# Patient Record
Sex: Male | Born: 1955
Health system: Southern US, Community
[De-identification: ages and names within clinical notes are randomized; demographics above are authoritative.]

## PROBLEM LIST (undated history)

## (undated) DIAGNOSIS — E119 Type 2 diabetes mellitus without complications: Secondary | ICD-10-CM

## (undated) DIAGNOSIS — I219 Acute myocardial infarction, unspecified: Secondary | ICD-10-CM

## (undated) DIAGNOSIS — F32A Depression, unspecified: Secondary | ICD-10-CM

## (undated) DIAGNOSIS — E785 Hyperlipidemia, unspecified: Secondary | ICD-10-CM

## (undated) DIAGNOSIS — F419 Anxiety disorder, unspecified: Secondary | ICD-10-CM

## (undated) DIAGNOSIS — I6529 Occlusion and stenosis of unspecified carotid artery: Secondary | ICD-10-CM

## (undated) DIAGNOSIS — K219 Gastro-esophageal reflux disease without esophagitis: Secondary | ICD-10-CM

## (undated) DIAGNOSIS — F329 Major depressive disorder, single episode, unspecified: Secondary | ICD-10-CM

## (undated) HISTORY — DX: Occlusion and stenosis of unspecified carotid artery: I65.29

## (undated) HISTORY — DX: Gastro-esophageal reflux disease without esophagitis: K21.9

## (undated) HISTORY — DX: Anxiety disorder, unspecified: F41.9

## (undated) HISTORY — DX: Major depressive disorder, single episode, unspecified: F32.9

## (undated) HISTORY — DX: Type 2 diabetes mellitus without complications: E11.9

## (undated) HISTORY — PX: ROTATOR CUFF REPAIR: SHX139

## (undated) HISTORY — DX: Acute myocardial infarction, unspecified: I21.9

## (undated) HISTORY — DX: Hyperlipidemia, unspecified: E78.5

## (undated) HISTORY — DX: Depression, unspecified: F32.A

---

## 2000-07-22 ENCOUNTER — Encounter: Payer: Self-pay | Admitting: Emergency Medicine

## 2000-07-22 ENCOUNTER — Emergency Department (HOSPITAL_COMMUNITY): Admission: EM | Admit: 2000-07-22 | Discharge: 2000-07-22 | Payer: Self-pay | Admitting: Emergency Medicine

## 2001-10-12 ENCOUNTER — Encounter: Payer: Self-pay | Admitting: Family Medicine

## 2001-10-12 ENCOUNTER — Encounter: Admission: RE | Admit: 2001-10-12 | Discharge: 2001-10-12 | Payer: Self-pay | Admitting: Family Medicine

## 2007-09-07 ENCOUNTER — Encounter: Payer: Self-pay | Admitting: Family Medicine

## 2007-09-21 ENCOUNTER — Encounter: Admission: RE | Admit: 2007-09-21 | Discharge: 2007-09-21 | Payer: Self-pay | Admitting: Sports Medicine

## 2007-10-20 ENCOUNTER — Ambulatory Visit (HOSPITAL_COMMUNITY)
Admission: RE | Admit: 2007-10-20 | Discharge: 2007-10-20 | Payer: Self-pay | Admitting: Physical Medicine and Rehabilitation

## 2007-10-27 ENCOUNTER — Ambulatory Visit: Payer: Self-pay | Admitting: Family Medicine

## 2007-10-27 DIAGNOSIS — E781 Pure hyperglyceridemia: Secondary | ICD-10-CM | POA: Insufficient documentation

## 2007-11-29 ENCOUNTER — Telehealth (INDEPENDENT_AMBULATORY_CARE_PROVIDER_SITE_OTHER): Payer: Self-pay | Admitting: *Deleted

## 2008-02-09 ENCOUNTER — Telehealth: Payer: Self-pay | Admitting: Family Medicine

## 2008-02-10 ENCOUNTER — Ambulatory Visit: Payer: Self-pay | Admitting: Family Medicine

## 2008-02-10 DIAGNOSIS — F4389 Other reactions to severe stress: Secondary | ICD-10-CM | POA: Insufficient documentation

## 2008-02-10 DIAGNOSIS — F438 Other reactions to severe stress: Secondary | ICD-10-CM

## 2008-03-06 ENCOUNTER — Ambulatory Visit: Payer: Self-pay | Admitting: Family Medicine

## 2008-07-18 IMAGING — CR DG ORBITS FOR FOREIGN BODY
2 series · 2 of 2 positions shown · non-contrast
Comparison: None

CLINICAL DATA: Metal exposure study.
 DIAGNOSTIC EYE FOREIGN BODY DETECTION ? 2 VIEW:

[view not recorded (1 of 2)]
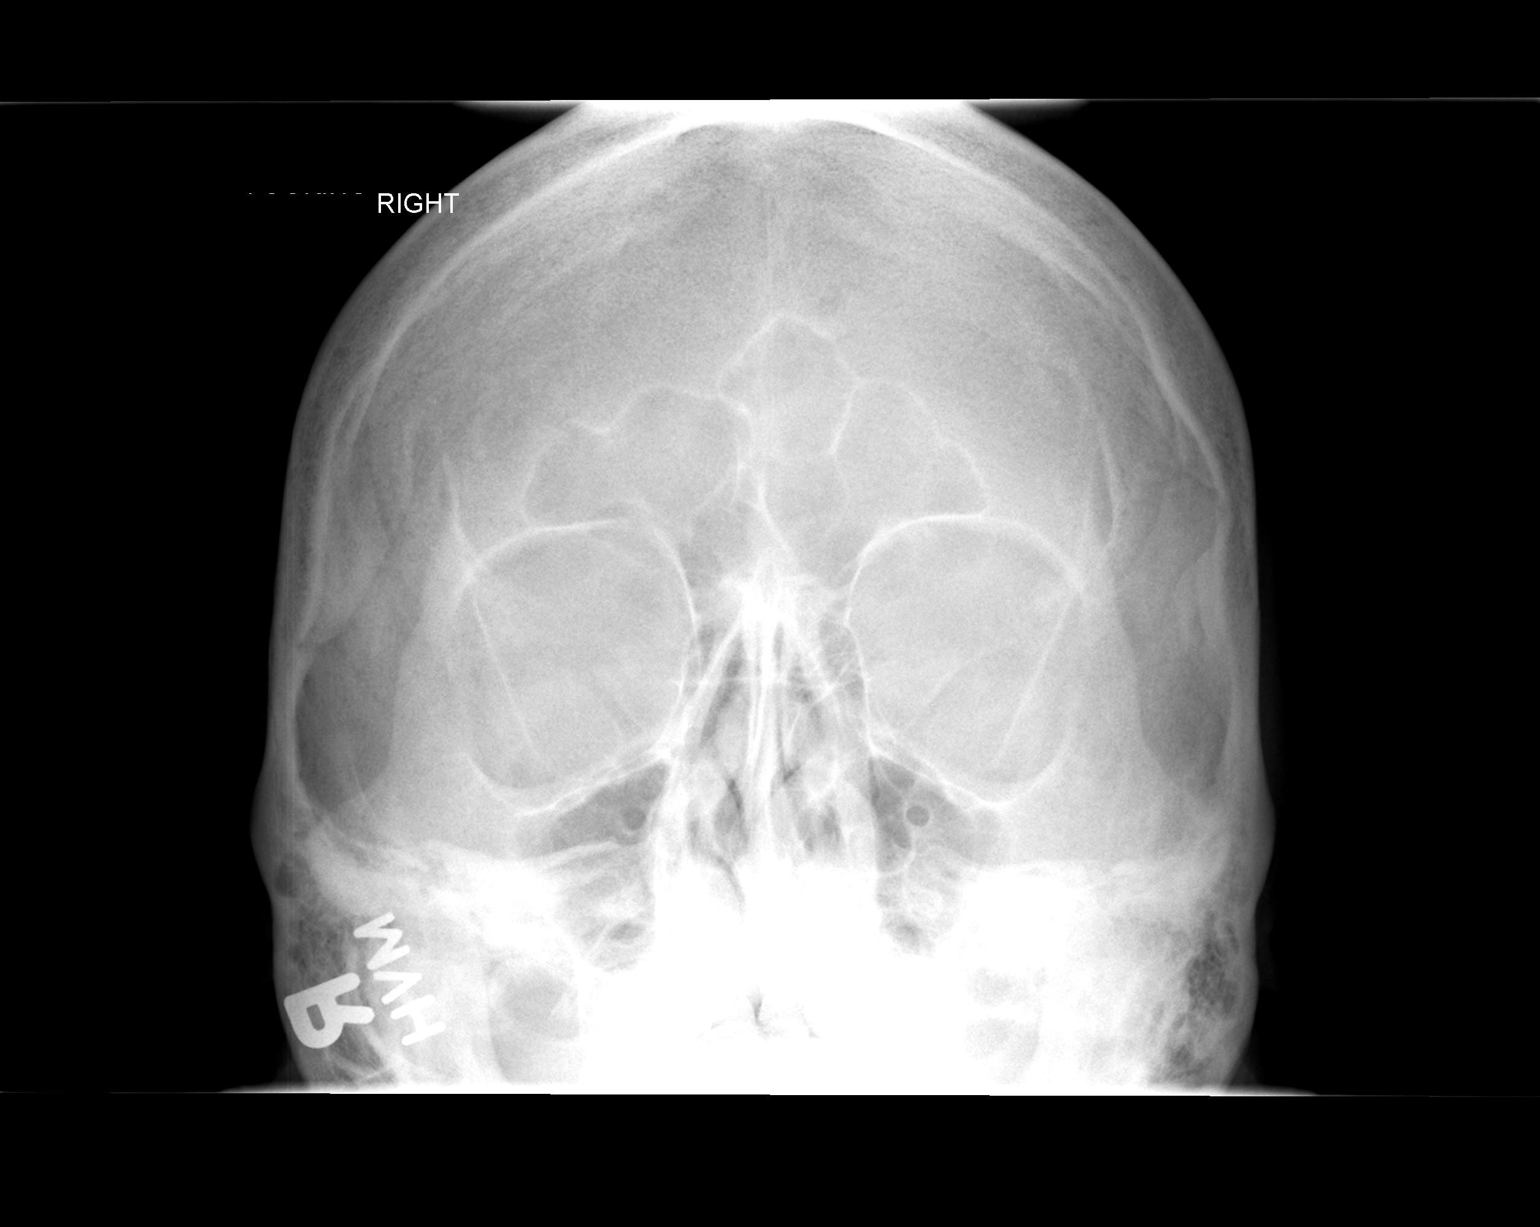

[view not recorded (2 of 2)]
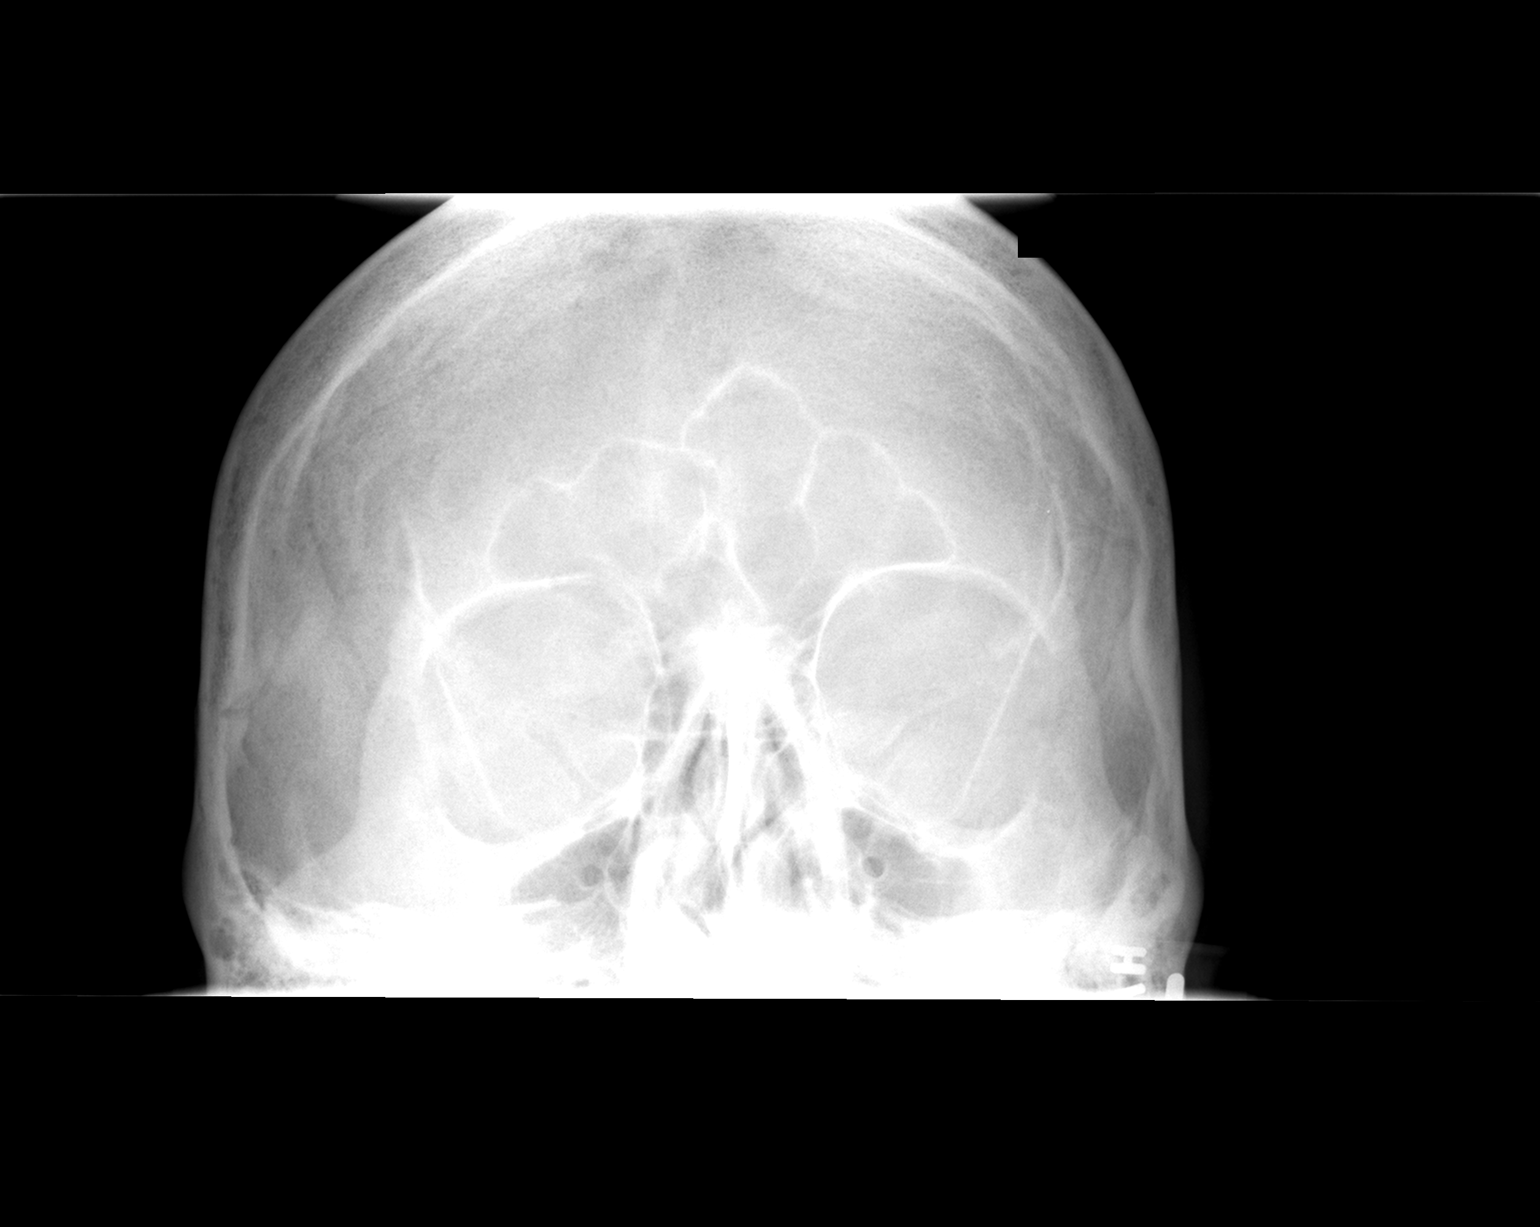

[2 of 2 positions shown; findings below may reference images not displayed]

FINDINGS: There is no evidence of metallic foreign body within the orbits.  No significant bone abnormality identified.
IMPRESSION: No evidence of metallic foreign body within the orbits.

## 2008-08-03 HISTORY — PX: SHOULDER SURGERY: SHX246

## 2009-07-11 ENCOUNTER — Ambulatory Visit: Payer: Self-pay | Admitting: Family Medicine

## 2009-07-11 LAB — CONVERTED CEMR LAB
ALT: 24 units/L (ref 0–53)
AST: 17 units/L (ref 0–37)
Albumin: 4.1 g/dL (ref 3.5–5.2)
Alkaline Phosphatase: 68 units/L (ref 39–117)
BUN: 18 mg/dL (ref 6–23)
Basophils Absolute: 0 10*3/uL (ref 0.0–0.1)
Basophils Relative: 0.5 % (ref 0.0–3.0)
Bilirubin Urine: NEGATIVE
Bilirubin, Direct: 0.1 mg/dL (ref 0.0–0.3)
Blood in Urine, dipstick: NEGATIVE
CO2: 28 meq/L (ref 19–32)
Calcium: 9.1 mg/dL (ref 8.4–10.5)
Chloride: 105 meq/L (ref 96–112)
Cholesterol: 243 mg/dL — ABNORMAL HIGH (ref 0–200)
Creatinine, Ser: 1.1 mg/dL (ref 0.4–1.5)
Direct LDL: 135.6 mg/dL
Eosinophils Absolute: 0.1 10*3/uL (ref 0.0–0.7)
Eosinophils Relative: 1.6 % (ref 0.0–5.0)
GFR calc non Af Amer: 74.24 mL/min (ref >60)
Glucose, Bld: 119 mg/dL — ABNORMAL HIGH (ref 70–99)
Glucose, Urine, Semiquant: NEGATIVE
HCT: 47.6 % (ref 39.0–52.0)
HDL: 44.3 mg/dL (ref >39.00)
Hemoglobin: 16.7 g/dL (ref 13.0–17.0)
Ketones, urine, test strip: NEGATIVE
Lymphocytes Relative: 25.1 % (ref 12.0–46.0)
Lymphs Abs: 1.4 10*3/uL (ref 0.7–4.0)
MCHC: 35.2 g/dL (ref 30.0–36.0)
MCV: 93.8 fL (ref 78.0–100.0)
Monocytes Absolute: 0.4 10*3/uL (ref 0.1–1.0)
Monocytes Relative: 6.6 % (ref 3.0–12.0)
Neutro Abs: 3.8 10*3/uL (ref 1.4–7.7)
Neutrophils Relative %: 66.2 % (ref 43.0–77.0)
Nitrite: NEGATIVE
PSA: 0.59 ng/mL (ref 0.10–4.00)
Platelets: 199 10*3/uL (ref 150.0–400.0)
Potassium: 4 meq/L (ref 3.5–5.1)
Protein, U semiquant: NEGATIVE
RBC: 5.07 M/uL (ref 4.22–5.81)
RDW: 11.7 % (ref 11.5–14.6)
Sodium: 142 meq/L (ref 135–145)
Specific Gravity, Urine: 1.02
TSH: 2.21 microintl units/mL (ref 0.35–5.50)
Total Bilirubin: 1.1 mg/dL (ref 0.3–1.2)
Total CHOL/HDL Ratio: 5
Total Protein: 6.6 g/dL (ref 6.0–8.3)
Triglycerides: 298 mg/dL — ABNORMAL HIGH (ref 0.0–149.0)
Urobilinogen, UA: 0.2
VLDL: 59.6 mg/dL — ABNORMAL HIGH (ref 0.0–40.0)
WBC Urine, dipstick: NEGATIVE
WBC: 5.7 10*3/uL (ref 4.5–10.5)
pH: 5

## 2009-07-22 ENCOUNTER — Ambulatory Visit: Payer: Self-pay | Admitting: Family Medicine

## 2009-11-26 ENCOUNTER — Telehealth: Payer: Self-pay | Admitting: Family Medicine

## 2009-12-10 ENCOUNTER — Encounter (INDEPENDENT_AMBULATORY_CARE_PROVIDER_SITE_OTHER): Payer: Self-pay | Admitting: *Deleted

## 2009-12-11 ENCOUNTER — Ambulatory Visit: Payer: Self-pay | Admitting: Gastroenterology

## 2009-12-23 ENCOUNTER — Ambulatory Visit: Payer: Self-pay | Admitting: Gastroenterology

## 2010-02-04 ENCOUNTER — Telehealth: Payer: Self-pay | Admitting: Family Medicine

## 2010-08-31 LAB — CONVERTED CEMR LAB
Basophils Absolute: 0 10*3/uL (ref 0.0–0.1)
Basophils Relative: 0.6 % (ref 0.0–1.0)
Eosinophils Absolute: 0.1 10*3/uL (ref 0.0–0.6)
Eosinophils Relative: 1 % (ref 0.0–5.0)
HCT: 45.1 % (ref 39.0–52.0)
Hemoglobin: 15.7 g/dL (ref 13.0–17.0)
Lymphocytes Relative: 21.4 % (ref 12.0–46.0)
MCHC: 34.8 g/dL (ref 30.0–36.0)
MCV: 92.5 fL (ref 78.0–100.0)
Monocytes Absolute: 0.4 10*3/uL (ref 0.2–0.7)
Monocytes Relative: 5.7 % (ref 3.0–11.0)
Neutro Abs: 4.4 10*3/uL (ref 1.4–7.7)
Neutrophils Relative %: 71.3 % (ref 43.0–77.0)
Platelets: 230 10*3/uL (ref 150–400)
RBC: 4.88 M/uL (ref 4.22–5.81)
RDW: 11.9 % (ref 11.5–14.6)
TSH: 1.19 microintl units/mL (ref 0.35–5.50)
WBC: 6.2 10*3/uL (ref 4.5–10.5)

## 2010-09-02 NOTE — Procedures (Signed)
Summary: Colonoscopy  Patient: [name] Note: All result statuses are Final unless otherwise noted.  Tests: (1) Colonoscopy (COL)   COL Colonoscopy           DONE     [facility]     [address]     [place], [place]  [address]           COLONOSCOPY PROCEDURE REPORT           PATIENT:  Matthew Patterson, Matthew Patterson  MR#:  [id number]     BIRTHDATE:  1956-04-17, 54 yrs. old  GENDER:  male     ENDOSCOPIST:  Vania Rea. Jarold Motto, MD, [facility]     REF. BY:  Tinnie Gens A. Tawanna Cooler, M.D.     PROCEDURE DATE:  12/23/2009     PROCEDURE:  Surveillance Colonoscopy     ASA CLASS:  Class II     INDICATIONS:  Routine Risk Screening     MEDICATIONS:   Fentanyl 75 mcg IV, Versed 6 mg IV           DESCRIPTION OF PROCEDURE:   After the risks benefits and     alternatives of the procedure were thoroughly explained, informed     consent was obtained.  Digital rectal exam was performed and     revealed no abnormalities.   The LB CF-[id number] endoscope     was introduced through the anus and advanced to the cecum, which     was identified by both the appendix and ileocecal valve, without     limitations.  The quality of the prep was excellent, using     MoviPrep.  The instrument was then slowly withdrawn as the colon     was fully examined.     <<PROCEDUREIMAGES>>           FINDINGS:  Moderate diverticulosis was found in the sigmoid to     descending colon segments.  No polyps or cancers were seen.  This     was otherwise a normal examination of the colon.   Retroflexed     views in the rectum revealed no abnormalities.    The scope was     then withdrawn from the patient and the procedure completed.           COMPLICATIONS:  None     ENDOSCOPIC IMPRESSION:     1) Moderate diverticulosis in the sigmoid to descending colon     segments     2) No polyps or cancers     3) Otherwise normal examination     RECOMMENDATIONS:     1) high fiber diet     2) Continue current colorectal screening  recommendations for     "routine risk" patients with a repeat colonoscopy in 10 years.     REPEAT EXAM:  No           ______________________________     Vania Rea. Jarold Motto, MD, [facility]           CC:           n.     eSIGNED:   Vania Rea. Vang Kraeger at 12/23/2009 11:19 AM           Tarri Abernethy, [id number]  Note: An exclamation mark (!) indicates a result that was not dispersed into the flowsheet. Document Creation Date: 12/23/2009 11:20 AM _______________________________________________________________________  (1) Order result status: Final Collection or observation date-time: 12/23/2009 11:12 Requested date-time:  Receipt date-time:  Reported date-time:  Referring  Physician:   Ordering Physician: Sheryn Bison [phone]) Specimen Source:  Source: Launa Grill Order Number: [id number] Lab site:   Appended Document: Colonoscopy    Clinical Lists Changes  Observations: Added new observation of COLONNXTDUE: 12/2019 (12/23/2009 13:23)

## 2010-09-02 NOTE — Miscellaneous (Signed)
Summary: [facility] previsit  Clinical Lists Changes  Medications: Added new medication of MOVIPREP 100 GM  SOLR (PEG-KCL-NACL-NASULF-NA ASC-C) As per prep instructions. - Signed Rx of MOVIPREP 100 GM  SOLR (PEG-KCL-NACL-NASULF-NA ASC-C) As per prep instructions.;  #1 x 0;  Signed;  Entered by: Karl Bales RN;  Authorized by: Mardella Layman MD [facility];  Method used: [facility] [phone]*, [address] [place], [place], [place]  [address], Ph: [phone], Fax: [phone]    Prescriptions: MOVIPREP 100 GM  SOLR (PEG-KCL-NACL-NASULF-NA ASC-C) As per prep instructions.  #1 x 0   Entered by:   Karl Bales RN   Authorized by:   Mardella Layman MD [facility]   Signed by:   Karl Bales RN on 12/11/2009   Method used:   Electronically to        [facility] [phone]* (retail)       [address]       [place], [place]  [address]       Ph: [phone]       Fax: [phone]   RxID:   [id number]

## 2010-09-02 NOTE — Letter (Signed)
Summary: [facility] Instructions  [facility]  [address] [place], [place] [address]   Phone: [phone]  Fax: [phone]       Matthew Patterson    Apr 01, 1956    MRN: [id number]        Procedure Day Dorna Bloom: [facility]  12/23/09     Arrival Time:  10:00am     Procedure Time:  11:00am     Location of Procedure:                    _ X_  [facility] (4th Floor)                        PREPARATION FOR COLONOSCOPY WITH MOVIPREP   Starting 5 days prior to your procedure  [facility] 05/18 do not eat nuts, seeds, popcorn, corn, beans, peas,  salads, or any raw vegetables.  Do not take any fiber supplements (e.g. Metamucil, Citrucel, and Benefiber).  THE DAY BEFORE YOUR PROCEDURE         DATE: SUNDAY 05/22   1.  Drink clear liquids the entire day-NO SOLID FOOD  2.  Do not drink anything colored red or purple.  Avoid juices with pulp.  No orange juice.  3.  Drink at least 64 oz. (8 glasses) of fluid/clear liquids during the day to prevent dehydration and help the prep work efficiently.  CLEAR LIQUIDS INCLUDE: Water Jello Ice Popsicles Tea (sugar ok, no milk/cream) Powdered fruit flavored drinks Coffee (sugar ok, no milk/cream) Gatorade Juice: apple, white grape, white cranberry  Lemonade Clear bullion, consomm, broth Carbonated beverages (any kind) Strained chicken noodle soup Hard Candy                             4.  In the morning, mix first dose of MoviPrep solution:    Empty 1 Pouch A and 1 Pouch B into the disposable container    Add lukewarm drinking water to the top line of the container. Mix to dissolve    Refrigerate (mixed solution should be used within 24 hrs)  5.  Begin drinking the prep at 5:00 p.m. The MoviPrep container is divided by 4 marks.   Every 15 minutes drink the solution down to the next mark (approximately 8 oz) until the full liter is complete.   6.  Follow completed prep with 16 oz of clear liquid of your choice  (Nothing red or purple).  Continue to drink clear liquids until bedtime.  7.  Before going to bed, mix second dose of MoviPrep solution:    Empty 1 Pouch A and 1 Pouch B into the disposable container    Add lukewarm drinking water to the top line of the container. Mix to dissolve    Refrigerate  THE DAY OF YOUR PROCEDURE      DATE: MONDAY 05/23  Beginning at  6:00 a.m. (5 hours before procedure):         1. Every 15 minutes, drink the solution down to the next mark (approx 8 oz) until the full liter is complete.  2. Follow completed prep with 16 oz. of clear liquid of your choice.    3. You may drink clear liquids until 9:00am  (2 HOURS BEFORE PROCEDURE).   MEDICATION INSTRUCTIONS  Unless otherwise instructed, you should take regular prescription medications with a small sip of water   as early as possible the  morning of your procedure.         OTHER INSTRUCTIONS  You will need a responsible adult at least 55 years of age to accompany you and drive you home.   This person must remain in the waiting room during your procedure.  Wear loose fitting clothing that is easily removed.  Leave jewelry and other valuables at home.  However, you may wish to bring a book to read or  an iPod/MP3 player to listen to music as you wait for your procedure to start.  Remove all body piercing jewelry and leave at home.  Total time from sign-in until discharge is approximately 2-3 hours.  You should go home directly after your procedure and rest.  You can resume normal activities the  day after your procedure.  The day of your procedure you should not:   Drive   Make legal decisions   Operate machinery   Drink alcohol   Return to work  You will receive specific instructions about eating, activities and medications before you leave.    The above instructions have been reviewed and explained to me by   Karl Bales RN  Dec 11, 2009 8:33 AM    I fully understand and  can verbalize these instructions _____________________________ Date _________

## 2010-09-02 NOTE — Progress Notes (Signed)
Summary: refill sent  Phone Note Refill Request Message from:  Fax from Pharmacy on February 04, 2010 3:16 PM  Refills Requested: Medication #1:  KLONOPIN 0.5 MG  TABS 1 tab @ bedtime. Initial call taken by: Kern Reap CMA Duncan Dull),  February 04, 2010 3:16 PM    Prescriptions: KLONOPIN 0.5 MG  TABS (CLONAZEPAM) 1 tab @ bedtime  #100 x 3   Entered by:   Kern Reap CMA (AAMA)   Authorized by:   Roderick Pee MD   Signed by:   Kern Reap CMA (AAMA) on 02/04/2010   Method used:   Telephoned to ...       [facility] [phone]* (retail)       [address]       [place], [place]  [address]       Ph: [phone]       Fax: [phone]   RxID:   [id number]

## 2010-09-02 NOTE — Progress Notes (Signed)
Summary: Referral for Colonoscopy  Phone Note Call from Patient Call back at [phone]   Caller: Spouse Summary of Call: Pt is due for colonoscopy please call pt and schedule Initial call taken by: Trixie Dredge,  November 26, 2009 10:58 AM  Follow-up for Phone Call        colonoscopy in gi Follow-up by: Roderick Pee MD,  November 26, 2009 1:24 PM  New Problems: SCREENING, COLON CANCER (ICD-V76.51)   New Problems: SCREENING, COLON CANCER (ICD-V76.51)

## 2010-12-24 ENCOUNTER — Other Ambulatory Visit: Payer: Self-pay | Admitting: Family Medicine

## 2011-04-30 ENCOUNTER — Other Ambulatory Visit: Payer: Self-pay | Admitting: *Deleted

## 2011-04-30 MED ORDER — CLONAZEPAM 0.5 MG PO TABS: 0.5000 mg | ORAL_TABLET | Freq: Two times a day (BID) | ORAL | Status: DC

## 2011-08-04 ENCOUNTER — Other Ambulatory Visit: Payer: Self-pay | Admitting: Family Medicine

## 2011-08-10 ENCOUNTER — Other Ambulatory Visit: Payer: Self-pay | Admitting: Family Medicine

## 2011-08-10 NOTE — Telephone Encounter (Signed)
Refill Cl[facility]am [facility]ram [facility].

## 2011-08-20 ENCOUNTER — Ambulatory Visit (INDEPENDENT_AMBULATORY_CARE_PROVIDER_SITE_OTHER)
Admission: RE | Admit: 2011-08-20 | Discharge: 2011-08-20 | Disposition: A | Discharge status: Home / Self Care | Payer: BC Managed Care – PPO | Source: Ambulatory Visit | Attending: Family Medicine | Admitting: Family Medicine

## 2011-08-20 ENCOUNTER — Ambulatory Visit (INDEPENDENT_AMBULATORY_CARE_PROVIDER_SITE_OTHER): Payer: BC Managed Care – PPO | Admitting: Family Medicine

## 2011-08-20 ENCOUNTER — Encounter: Payer: Self-pay | Admitting: Family Medicine

## 2011-08-20 VITALS — BP 140/90 | Temp 98.5°F | Ht 68.0 in | Wt 207.0 lb

## 2011-08-20 DIAGNOSIS — M25572 Pain in left ankle and joints of left foot: Secondary | ICD-10-CM

## 2011-08-20 DIAGNOSIS — M25579 Pain in unspecified ankle and joints of unspecified foot: Secondary | ICD-10-CM

## 2011-08-20 NOTE — Progress Notes (Signed)
  Subjective:    Patient ID: Matthew Patterson, male    DOB: [birth year], 56 y.o.   MRN: [id number]  HPI Matthew Patterson is a 56 year old male, who comes in today for evaluation of left ankle pain.  He states about 10 years ago.  He had a severe ankle injury and torn ligaments in his left ankle and required splinting for a long time until the pain and swelling resolved.  For about 6 weeks.  He said pain in his left ankle.  Again no history of trauma.  He points to below the lateral malleolus as a source of his pain.   Review of Systems    General an orthopedic review of systems otherwise negative Objective:   Physical Exam  Well-developed well-nourished, male in no acute distress.  Examination of left foot shows the foot appears to be normal.  No swelling circulation normal.  Skin normal.  No ligament laxity.  There is bony tenderness below the lateral malleolus      Assessment & Plan:  Pain left ankle, unknown etiology.  Plan elevation, ice, Motrin x-ray

## 2011-08-20 NOTE — Patient Instructions (Signed)
Take 600 mg of Motrin twice daily with food.  Do the stretching exercises in the morning before getting out of bed.  Elevation and ice p.r.n.Marland Kitchen  Go to the main office now for your x-ray......... I will call you the report

## 2011-08-27 ENCOUNTER — Telehealth: Payer: Self-pay | Admitting: Family Medicine

## 2011-10-05 ENCOUNTER — Other Ambulatory Visit (INDEPENDENT_AMBULATORY_CARE_PROVIDER_SITE_OTHER): Payer: BC Managed Care – PPO

## 2011-10-05 DIAGNOSIS — Z0000 Encounter for general adult medical examination without abnormal findings: Secondary | ICD-10-CM

## 2011-10-05 LAB — BASIC METABOLIC PANEL
BUN: 21 mg/dL (ref 6–23)
CO2: 26 mEq/L (ref 19–32)
Calcium: 8.9 mg/dL (ref 8.4–10.5)
Chloride: 103 mEq/L (ref 96–112)
Creatinine, Ser: 1.2 mg/dL (ref 0.4–1.5)

## 2011-10-05 LAB — POCT URINALYSIS DIPSTICK
Blood, UA: NEGATIVE
Glucose, UA: NEGATIVE
Spec Grav, UA: 1.025
Urobilinogen, UA: 1
pH, UA: 5.5

## 2011-10-05 LAB — CBC WITH DIFFERENTIAL/PLATELET
Basophils Absolute: 0 10*3/uL (ref 0.0–0.1)
Eosinophils Absolute: 0.1 10*3/uL (ref 0.0–0.7)
MCHC: 34.6 g/dL (ref 30.0–36.0)
MCV: 94 fl (ref 78.0–100.0)
Monocytes Absolute: 0.3 10*3/uL (ref 0.1–1.0)
Neutrophils Relative %: 68.4 % (ref 43.0–77.0)
Platelets: 190 10*3/uL (ref 150.0–400.0)
RDW: 12.6 % (ref 11.5–14.6)

## 2011-10-05 LAB — LIPID PANEL
Cholesterol: 256 mg/dL — ABNORMAL HIGH (ref 0–200)
HDL: 41.1 mg/dL (ref >39.00)
Total CHOL/HDL Ratio: 6
Triglycerides: 591 mg/dL — ABNORMAL HIGH (ref 0.0–149.0)
VLDL: 118.2 mg/dL — ABNORMAL HIGH (ref 0.0–40.0)

## 2011-10-05 LAB — HEPATIC FUNCTION PANEL
Bilirubin, Direct: 0.2 mg/dL (ref 0.0–0.3)
Total Bilirubin: 1.1 mg/dL (ref 0.3–1.2)
Total Protein: 6.9 g/dL (ref 6.0–8.3)

## 2011-10-12 ENCOUNTER — Encounter: Payer: BC Managed Care – PPO | Admitting: Family Medicine

## 2011-11-30 ENCOUNTER — Other Ambulatory Visit: Payer: Self-pay | Admitting: Family Medicine

## 2012-01-26 ENCOUNTER — Telehealth: Payer: Self-pay | Admitting: Family Medicine

## 2012-01-26 NOTE — Telephone Encounter (Signed)
Pt will not have insurance after July 17th and would like to be worked in for a Cpe. Please advise

## 2012-01-27 ENCOUNTER — Other Ambulatory Visit: Payer: Self-pay | Admitting: *Deleted

## 2012-01-27 MED ORDER — CITALOPRAM HYDROBROMIDE 20 MG PO TABS: ORAL_TABLET | ORAL | Status: DC

## 2012-01-27 NOTE — Telephone Encounter (Signed)
ok 

## 2012-02-11 ENCOUNTER — Encounter: Payer: Self-pay | Admitting: Family Medicine

## 2012-02-11 ENCOUNTER — Ambulatory Visit (INDEPENDENT_AMBULATORY_CARE_PROVIDER_SITE_OTHER): Payer: BC Managed Care – PPO | Admitting: Family Medicine

## 2012-02-11 VITALS — BP 130/90 | Temp 98.5°F | Ht 68.5 in | Wt 195.0 lb

## 2012-02-11 DIAGNOSIS — F4389 Other reactions to severe stress: Secondary | ICD-10-CM

## 2012-02-11 DIAGNOSIS — E782 Mixed hyperlipidemia: Secondary | ICD-10-CM

## 2012-02-11 DIAGNOSIS — F438 Other reactions to severe stress: Secondary | ICD-10-CM

## 2012-02-11 DIAGNOSIS — Z0000 Encounter for general adult medical examination without abnormal findings: Secondary | ICD-10-CM

## 2012-02-11 DIAGNOSIS — K2100 Gastro-esophageal reflux disease with esophagitis, without bleeding: Secondary | ICD-10-CM

## 2012-02-11 MED ORDER — CITALOPRAM HYDROBROMIDE 20 MG PO TABS: ORAL_TABLET | ORAL | Status: DC

## 2012-02-11 MED ORDER — OMEPRAZOLE 20 MG PO CPDR: DELAYED_RELEASE_CAPSULE | ORAL | Status: DC

## 2012-02-11 MED ORDER — CLONAZEPAM 0.5 MG PO TABS: 0.5000 mg | ORAL_TABLET | Freq: Two times a day (BID) | ORAL | Status: DC

## 2012-02-11 NOTE — Progress Notes (Signed)
  Subjective:    Patient ID: Matthew Patterson, male    DOB: 03-27-1956, 56 y.o.   MRN: [id number]  HPI Matthew Patterson is a 56 year old married male nonsmoker who comes in today for general medical examination  He has a history of mild anxiety and depression he takes Celexa 30 mg each bedtime and Klonopin 0.5 twice a day when necessary he averages about 40 tabs of the Klonopin per month. About 2 months ago he started having symptoms of reflux acid up in his esophagus. Sometimes it wakes him up at night. He does not smoke drink alcohol use caffeinated beverages or consume peppermint. He does tend to eat late at night.  Tetanus booster 2004  Originally from [place] Review of Systems  Constitutional: Negative.   HENT: Negative.   Eyes: Negative.   Respiratory: Negative.   Cardiovascular: Negative.   Gastrointestinal: Negative.   Genitourinary: Negative.   Musculoskeletal: Negative.   Skin: Negative.   Neurological: Negative.   Hematological: Negative.   Psychiatric/Behavioral: Negative.        Objective:   Physical Exam  Constitutional: He is oriented to person, place, and time. He appears well-developed and well-nourished.  HENT:  Head: Normocephalic and atraumatic.  Right Ear: External ear normal.  Left Ear: External ear normal.  Nose: Nose normal.  Mouth/Throat: Oropharynx is clear and moist.  Eyes: Conjunctivae and EOM are normal. Pupils are equal, round, and reactive to light.  Neck: Normal range of motion. Neck supple. No JVD present. No tracheal deviation present. No thyromegaly present.  Cardiovascular: Normal rate, regular rhythm, normal heart sounds and intact distal pulses.  Exam reveals no gallop and no friction rub.   No murmur heard. Pulmonary/Chest: Effort normal and breath sounds normal. No stridor. No respiratory distress. He has no wheezes. He has no rales. He exhibits no tenderness.  Abdominal: Soft. Bowel sounds are normal. He exhibits no distension and no  mass. There is no tenderness. There is no rebound and no guarding.  Genitourinary: Rectum normal, prostate normal and penis normal. Guaiac negative stool. No penile tenderness.  Musculoskeletal: Normal range of motion. He exhibits no edema and no tenderness.  Lymphadenopathy:    He has no cervical adenopathy.  Neurological: He is alert and oriented to person, place, and time. He has normal reflexes. No cranial nerve deficit. He exhibits normal muscle tone.  Skin: Skin is warm and dry. No rash noted. No erythema. No pallor.  Psychiatric: He has a normal mood and affect. His behavior is normal. Judgment and thought content normal.          Assessment & Plan:  Healthy male  Elevated triglycerides recommended diet exercise followup lipid panel in 3 months  History of mild depression and anxiety continue Celexa nightly Klonopin when necessary  Reflux esophagitis outlined in time reflux program

## 2012-02-11 NOTE — Patient Instructions (Signed)
For the reflux esophagitis nothing daily to for 3 hours prior to bedtime sleep on 2 pillows take the Prilosec twice daily for 3 weeks then one before your evening meal for 3 months then stop  Because of the elevated triglycerides I would recommend the diet we outlined, 20 minutes walk daily  Followup lipid panel and 3 months  General physical yearly  Continue the Celexa 1-1/2 tablets at bedtime  Klonopin 0.5,,,,,,,,, 1 twice daily as needed

## 2012-05-12 ENCOUNTER — Other Ambulatory Visit: Payer: BC Managed Care – PPO

## 2012-08-02 ENCOUNTER — Encounter: Payer: Self-pay | Admitting: Family Medicine

## 2012-08-02 ENCOUNTER — Ambulatory Visit (INDEPENDENT_AMBULATORY_CARE_PROVIDER_SITE_OTHER): Payer: Managed Care, Other (non HMO) | Admitting: Family Medicine

## 2012-08-02 VITALS — BP 120/80 | HR 94 | Temp 98.1°F | Wt 198.0 lb

## 2012-08-02 DIAGNOSIS — J398 Other specified diseases of upper respiratory tract: Secondary | ICD-10-CM

## 2012-08-02 DIAGNOSIS — J399 Disease of upper respiratory tract, unspecified: Secondary | ICD-10-CM

## 2012-08-02 MED ORDER — GUAIFENESIN-CODEINE 100-10 MG/5ML PO SYRP: 5.0000 mL | ORAL_SOLUTION | Freq: Three times a day (TID) | ORAL | Status: DC | PRN

## 2012-08-02 NOTE — Progress Notes (Signed)
Chief Complaint  Patient presents with  . Sore Throat    cough, soreness, fever, chest tightness x yesterday     HPI:  ? Flu: -started: yesterday -symptoms:nasal congestion, sore throat, cough -denies:fever, SOB, NVD, tooth pain, ear pain, strep, flu or mono exposure -has tried: OTC cold medicine -sick contacts: work Music therapist -Hx of - no hx of asthma  ROS: See pertinent positives and negatives per HPI.  No past medical history on file.  Family History  Problem Relation Age of Onset  . Diabetes Mother   . Hypertension Mother   . Cancer Sister     uterine cancer    History   Social History  . Marital Status: Married    Spouse Name: N/A    Number of Children: N/A  . Years of Education: N/A   Social History Main Topics  . Smoking status: Never Smoker   . Smokeless tobacco: None  . Alcohol Use: Yes  . Drug Use: No  . Sexually Active:    Other Topics Concern  . None   Social History Narrative  . None    Current outpatient prescriptions:clonazePAM (KLONOPIN) 0.5 MG tablet, Take 1 tablet (0.5 mg total) by mouth 2 (two) times daily., Disp: 100 tablet, Rfl: 2;  omeprazole (PRILOSEC) 20 MG capsule, 1 twice daily for 3 weeks then one before evening meal x3 months, Disp: 100 capsule, Rfl: 3;  citalopram (CELEXA) 20 MG tablet, 1 1/2 at bedtime, Disp: 150 tablet, Rfl: 3 guaiFENesin-codeine (ROBITUSSIN AC) 100-10 MG/5ML syrup, Take 5 mLs by mouth 3 (three) times daily as needed for cough., Disp: 120 mL, Rfl: 0  EXAM:  Filed Vitals:   08/02/12 0956  BP: 120/80  Pulse: 94  Temp: 98.1 F (36.7 C)    There is no height on file to calculate BMI.  GENERAL: vitals reviewed and listed above, alert, oriented, appears well hydrated and in no acute distress  HEENT: atraumatic, conjunttiva clear, no obvious abnormalities on inspection of external nose and ears, normal appearance of ear canals and TMs, clear nasal congestion, mild post oropharyngeal erythema with PND, no  tonsillar edema or exudate, no sinus TTP  NECK: no obvious masses on inspection  LUNGS: clear to auscultation bilaterally, no wheezes, rales or rhonchi, good air movement  CV: HRRR, no peripheral edema  MS: moves all extremities without noticeable abnormality  PSYCH: pleasant and cooperative, no obvious depression or anxiety  ASSESSMENT AND PLAN:  Discussed the following assessment and plan:  1. Upper respiratory disease  guaiFENesin-codeine (ROBITUSSIN AC) 100-10 MG/5ML syrup   -likely viral, supportive care and return precautions, discussed risks of cough medication -Patient advised to return or notify a doctor immediately if symptoms worsen or persist or new concerns arise.  Patient Instructions  INSTRUCTIONS FOR UPPER RESPIRATORY INFECTION:  -plenty of rest and fluids  -nasal saline wash 2-3 times daily (use prepackaged nasal saline or bottled/distilled water if making your own)   -can use sinex nasal spray for drainage and nasal congestion - but do NOT use longer then 3-4 days  -can use tylenol or ibuprofen as directed for aches and sorethroat  -in the winter time, using a humidifier at night is helpful (please follow cleaning instructions)  -if you are taking a cough medication - use only as directed, may also try a teaspoon of honey to coat the throat and throat lozenges  -for sore throat, salt water gargles can help  -follow up if you have fevers, facial pain, tooth pain, difficulty breathing  or are worsening or not getting better in 5-7 days      KIM, [facility] R.

## 2012-08-02 NOTE — Patient Instructions (Addendum)

## 2012-08-07 ENCOUNTER — Other Ambulatory Visit: Payer: Self-pay | Admitting: Family Medicine

## 2012-08-23 ENCOUNTER — Other Ambulatory Visit: Payer: Self-pay | Admitting: *Deleted

## 2012-08-23 DIAGNOSIS — F4389 Other reactions to severe stress: Secondary | ICD-10-CM

## 2012-08-23 DIAGNOSIS — F438 Other reactions to severe stress: Secondary | ICD-10-CM

## 2012-08-23 MED ORDER — CLONAZEPAM 0.5 MG PO TABS: 0.5000 mg | ORAL_TABLET | Freq: Two times a day (BID) | ORAL | Status: DC

## 2012-10-19 ENCOUNTER — Telehealth: Payer: Self-pay | Admitting: Family Medicine

## 2012-10-19 NOTE — Telephone Encounter (Signed)
Patient Information:  Caller Name: Sheilah Pigeon  Phone: [phone]  Patient: Matthew Patterson, Matthew Patterson  Gender: Male  DOB: August 23, 1955  Age: 57 Years  PCP: Kelle Darting [facility])  Office Follow Up:  Does the office need to follow up with this patient?: No  Instructions For The Office: N/A  RN Note:  Family history of early MI's. Under lots of stress. Exhausted.  Had severe, stabbing pain between shoulder blades with nausea/emesis and fatigue 10/12/12.  Was still not feeling "great" 10/17/12 so had a deep tissue massage 10/18/12. Wife calling for wellness visit to "get him checked out." Tien is working in [place] at time of call, does not know wife is calling for appointment and could not make it back to town before office closes. 30 minute appointment scheduled for 10/20/12 at 0900 with Dr Selena Batten.  Educated about 5 minute rule for chest pain and symptoms of MI.  If chest pain present > 5 minutes tonight, to call 911.    Symptoms  Reason For Call & Symptoms: Emergent: Asking for [name] to be checked for heart condition on maternal side of family with early MI's in 5th decade due to short valve.  Had episode of severe chest pain 10/12/12 that lasted > 1 hour with emesis.  Matthew Patterson is not present for triage; is not aware wife was calling for well appointment.  Has gained 20 lbs in last year. Is tired, sluggish and "depressed."  Reviewed Health History In EMR: Yes  Reviewed Medications In EMR: Yes  Reviewed Allergies In EMR: Yes  Reviewed Surgeries / Procedures: Yes  Date of Onset of Symptoms: 10/12/2012  Guideline(s) Used:  Chest Pain  Disposition Per Guideline:   See Today in Office  Reason For Disposition Reached:   Intermittent chest pains persist > 3 days  Advice Given:  Fleeting Chest Pain:  Fleeting chest pains that last only a few seconds and then go away are generally not serious. They may be from pinched muscles or nerves in your chest wall.  Call Back If:  Severe chest pain  Constant  chest pain lasting longer than 5 minutes  Difficulty breathing  Fever  You become worse.  Patient Will Follow Care Advice:  YES  Appointment Scheduled:  10/20/2012 09:00:00 Appointment Scheduled Provider:  Kriste Basque [facility])

## 2012-10-20 ENCOUNTER — Telehealth: Payer: Self-pay | Admitting: Family Medicine

## 2012-10-20 ENCOUNTER — Ambulatory Visit (INDEPENDENT_AMBULATORY_CARE_PROVIDER_SITE_OTHER): Payer: Managed Care, Other (non HMO) | Admitting: Family Medicine

## 2012-10-20 ENCOUNTER — Encounter: Payer: Self-pay | Admitting: Family Medicine

## 2012-10-20 VITALS — BP 140/80 | HR 70 | Temp 98.9°F | Wt 203.0 lb

## 2012-10-20 DIAGNOSIS — R0789 Other chest pain: Secondary | ICD-10-CM

## 2012-10-20 DIAGNOSIS — K219 Gastro-esophageal reflux disease without esophagitis: Secondary | ICD-10-CM

## 2012-10-20 DIAGNOSIS — F4389 Other reactions to severe stress: Secondary | ICD-10-CM

## 2012-10-20 DIAGNOSIS — R112 Nausea with vomiting, unspecified: Secondary | ICD-10-CM

## 2012-10-20 LAB — COMPREHENSIVE METABOLIC PANEL
Albumin: 3.8 g/dL (ref 3.5–5.2)
CO2: 27 mEq/L (ref 19–32)
Calcium: 8.4 mg/dL (ref 8.4–10.5)
GFR: 84.81 mL/min (ref >60.00)
Glucose, Bld: 134 mg/dL — ABNORMAL HIGH (ref 70–99)
Sodium: 139 mEq/L (ref 135–145)
Total Bilirubin: 0.9 mg/dL (ref 0.3–1.2)
Total Protein: 6.7 g/dL (ref 6.0–8.3)

## 2012-10-20 LAB — LIPID PANEL
Cholesterol: 214 mg/dL — ABNORMAL HIGH (ref 0–200)
VLDL: 107.2 mg/dL — ABNORMAL HIGH (ref 0.0–40.0)

## 2012-10-20 MED ORDER — CITALOPRAM HYDROBROMIDE 20 MG PO TABS: ORAL_TABLET | ORAL | Status: DC

## 2012-10-20 NOTE — Progress Notes (Signed)
Chief Complaint  Patient presents with  . episdoes of chest pain    HPI:  Atypical Chest Pain: -had some pain in upper shoulders while sitting at a computer last week -then had nausea and vomiting, had nausea the rest of the day and felt sick, threw up several times and had some burning substernal CP - nausea and malaise resolved after a day -back pain has persisted, severe at times constant - and had massage for this which helped -MGF died at 72 of MI, and mat aunt had MI at 81, maternal uncle had congenital heart defect - wife is very worried about heart issues and requests a referral to see the cardiolgist -Patient denies history of HLD, HTN, DM, smoke a cigar about once per month -denies: fevers, chills, weight changes, SOB, DOE, chest pain on exertion -does have a history of GERD but he only take this as needed, does have acid reflux issues, has chronic issues with throwing up in the morning sometimes several times a week to several times per month for many years -wife reports he sometimes chokes on food when he eat -per review of chart has hx of triglyceride issues  ROS: See pertinent positives and negatives per HPI.  No past medical history on file.  Family History  Problem Relation Age of Onset  . Diabetes Mother   . Hypertension Mother   . Cancer Sister     uterine cancer    History   Social History  . Marital Status: Married    Spouse Name: N/A    Number of Children: N/A  . Years of Education: N/A   Social History Main Topics  . Smoking status: Never Smoker   . Smokeless tobacco: None  . Alcohol Use: Yes  . Drug Use: No  . Sexually Active:    Other Topics Concern  . None   Social History Narrative  . None    Current outpatient prescriptions:citalopram (CELEXA) 20 MG tablet, 1 1/2 at bedtime, Disp: 150 tablet, Rfl: 3;  citalopram (CELEXA) 20 MG tablet, TAKE ONE AND ONE-HALF TABLETS BY MOUTH EVERY NIGHT AT BEDTIME, Disp: 45 tablet, Rfl: 3;  clonazePAM  (KLONOPIN) 0.5 MG tablet, Take 1 tablet (0.5 mg total) by mouth 2 (two) times daily., Disp: 200 tablet, Rfl: 1 omeprazole (PRILOSEC) 20 MG capsule, 1 twice daily for 3 weeks then one before evening meal x3 months, Disp: 100 capsule, Rfl: 3;  guaiFENesin-codeine (ROBITUSSIN AC) 100-10 MG/5ML syrup, Take 5 mLs by mouth 3 (three) times daily as needed for cough., Disp: 120 mL, Rfl: 0  EXAM:  Filed Vitals:   10/20/12 0859  BP: 140/80  Pulse: 70  Temp: 98.9 F (37.2 C)    Body mass index is 30.41 kg/(m^2).  GENERAL: vitals reviewed and listed above, alert, oriented, appears well hydrated and in no acute distress  HEENT: atraumatic, conjunttiva clear, no obvious abnormalities on inspection of external nose and ears  NECK: no obvious masses on inspection  LUNGS: clear to auscultation bilaterally, no wheezes, rales or rhonchi, good air movement  CV: HRRR, no peripheral edema  ABD: BS+, soft, NTTP  MS: moves all extremities without noticeable abnormality, TTP paraspinal upper thoracic and trap musculature  PSYCH: pleasant and cooperative, no obvious depression or anxiety  ASSESSMENT AND PLAN:  Discussed the following assessment and plan:  Atypical chest pain - Plan: Ambulatory referral to Cardiology, Ambulatory referral to Gastroenterology, CMP, Lipid Panel, CMP  GERD (gastroesophageal reflux disease) - Plan: Ambulatory referral to Gastroenterology  Nausea and vomiting - Plan: Ambulatory referral to Gastroenterology  -57 yo male with PMH dyslipidemia and FH heart dz presenting with back pain, atypical CP, nausea and vomiting several times per week to month for many years, uncontrolled GERD -EKG looks good and basic labs today pending -referred to cards per wife and pt adamant request -feel back pain is more musculoskeletal as reproducible on exam - advised heat, massage, etc -referred to GI as likely needs EGD and wife wants referral, advised daily PPI in the interim and GERD diet   -advised importance healthy diet, regular exercise and weight management -advised to cut back on alcohol consumption -follow up with PCP in 2 months, return and ED precautions -Patient advised to return or notify a doctor immediately if symptoms worsen or persist or new concerns arise.  There are no Patient Instructions on file for this visit.   Kriste Basque R.

## 2012-10-20 NOTE — Patient Instructions (Signed)
-  We placed a referral for you as discussed to the CARDIOLOGIST and to the GASTROENTEROLOGIST. It usually takes about 1-2 weeks to process and schedule this referral. If you have not heard from [place] regarding this appointment in 2 weeks please contact our office.  -We have ordered labs or studies at this visit. It can take up to 1-2 weeks for results and processing. We will contact you with instructions IF your results are abnormal. Normal results will be released to your [facility]. If you have not heard from [place] or can not find your results in [facility] in 2 weeks please contact our office.  -take your prilosec daily  -follow GERD diet  -schedule follow up with your doctor for physical

## 2012-10-20 NOTE — Telephone Encounter (Signed)
Labs stable. Triglycerides about the same as the past.

## 2012-10-20 NOTE — Addendum Note (Signed)
Addended by: Rossie Muskrat K on: 10/20/2012 10:00 AM   Modules accepted: Orders

## 2012-10-20 NOTE — Addendum Note (Signed)
Addended by: Azucena Freed on: 10/20/2012 12:03 PM   Modules accepted: Orders

## 2012-10-21 NOTE — Telephone Encounter (Signed)
Called and spoke with pt and pt is aware.  

## 2012-11-14 ENCOUNTER — Encounter: Payer: Self-pay | Admitting: *Deleted

## 2012-11-15 ENCOUNTER — Ambulatory Visit (INDEPENDENT_AMBULATORY_CARE_PROVIDER_SITE_OTHER): Payer: Managed Care, Other (non HMO) | Admitting: Cardiovascular Disease

## 2012-11-15 ENCOUNTER — Encounter: Payer: Self-pay | Admitting: Cardiovascular Disease

## 2012-11-15 VITALS — BP 152/100 | HR 56 | Ht 68.5 in | Wt 202.8 lb

## 2012-11-15 DIAGNOSIS — R9431 Abnormal electrocardiogram [ECG] [EKG]: Secondary | ICD-10-CM

## 2012-11-15 DIAGNOSIS — R0789 Other chest pain: Secondary | ICD-10-CM

## 2012-11-15 DIAGNOSIS — R079 Chest pain, unspecified: Secondary | ICD-10-CM

## 2012-11-15 NOTE — Patient Instructions (Addendum)
Your physician has requested that you have an echocardiogram. Echocardiography is a painless test that uses sound waves to create images of your heart. It provides your doctor with information about the size and shape of your heart and how well your heart's chambers and valves are working. This procedure takes approximately one hour. There are no restrictions for this procedure.  Your physician has requested that you have en exercise stress myoview.   Your physician recommends that you schedule a follow-up appointment in: 1 MONTH

## 2012-11-15 NOTE — Progress Notes (Signed)
     Matthew Patterson Date of Birth  03/28/1956       [facility] Office [address]  [address] [place], [place]  [address]   [place], [place]  [address] [phone]     [phone]   Fax  [phone]    Fax [phone]  Problem List: 1. Shoulder pain   History of Present Illness:  Matthew Patterson is a 57 yo with recent episode of shoulder and intrascapular pain.  This was associated with nausea and vomiting.  He was sitting at and working at his computer at the time. The most severe pain lasted several hours but he was left with a dull, achy pain that lasted for several days.  He has not had these pains previously. He exercises on occasion.  Since that time he has done his usual chores and does exercise some without any episodes of chest pain or shortness of breath. He has a strong family history of coronary disease on his mother's side of the family.  Current Outpatient Prescriptions on File Prior to Visit  Medication Sig Dispense Refill  . citalopram (CELEXA) 20 MG tablet 1 1/2 at bedtime  90 tablet  0  . clonazePAM (KLONOPIN) 0.5 MG tablet Take 1 tablet (0.5 mg total) by mouth 2 (two) times daily.  200 tablet  1  . guaiFENesin-codeine (ROBITUSSIN AC) 100-10 MG/5ML syrup Take 5 mLs by mouth 3 (three) times daily as needed for cough.  120 mL  0  . omeprazole (PRILOSEC) 20 MG capsule 1 twice daily for 3 weeks then one before evening meal x3 months  100 capsule  3   No current facility-administered medications on file prior to visit.    No Known Allergies  No past medical history on file.  Past Surgical History  Procedure Laterality Date  . Rotator cuff repair    . Shoulder surgery  2010    B/L    History  Smoking status  . Never Smoker   Smokeless tobacco  . Not on file    History  Alcohol Use  . Yes    Family History  Problem Relation Age of Onset  . Diabetes Mother   . Hypertension Mother   . Cancer Sister    uterine cancer    Reviw of Systems:  Reviewed in the HPI.  All other systems are negative.  Physical Exam: Blood pressure 152/100, pulse 56, height 5' 8.5" (1.74 m), weight 202 lb 12.8 oz (91.989 kg). General: Well developed, well nourished, in no acute distress.  Head: Normocephalic, atraumatic, sclera non-icteric, mucus membranes are moist,   Neck: Supple. Carotids are 2 + without bruits. No JVD   Lungs: Clear   Heart: RR, normal S1, S2  Abdomen: Soft, non-tender, non-distended with normal bowel sounds.  Msk:  Strength and tone are normal   Extremities: No clubbing or cyanosis. No edema.  Distal pedal pulses are 2+ and equal    Neuro: CN II - XII intact.  Alert and oriented X 3.   Psych:  Normal   ECG: November 15, 2012:  Sinus bradycardia at 56.  NS ST abnormality.  Assessment / Plan:

## 2012-11-15 NOTE — Assessment & Plan Note (Signed)
Jong presents with an episode of severe chest tightness that occurred spontaneously. He was sitting at his desk. The pain lasted for 20 or 30 minutes and then left him with a dull ache for the next day or so. It was associated with nausea and vomiting. He also had some shortness of breath. He did not have any dizziness or syncope.  I'm concerned that this might have been an episode of angina. He has a strong family history of premature coronary artery disease and also recently has had extremely elevated triglyceride levels above 500.  His EKG has mild ST changes.  We'll schedule him for a [employer] study.    He Also has a family history of congenital heart abnormalities. I think that we should get an echocardiogram given his symptoms and mildly abnormal EKG.

## 2012-11-16 ENCOUNTER — Other Ambulatory Visit: Payer: Self-pay | Admitting: *Deleted

## 2012-11-16 DIAGNOSIS — F4389 Other reactions to severe stress: Secondary | ICD-10-CM

## 2012-11-16 DIAGNOSIS — F438 Other reactions to severe stress: Secondary | ICD-10-CM

## 2012-11-16 MED ORDER — CLONAZEPAM 0.5 MG PO TABS: 0.5000 mg | ORAL_TABLET | Freq: Two times a day (BID) | ORAL | Status: DC

## 2012-11-17 ENCOUNTER — Ambulatory Visit (HOSPITAL_COMMUNITY): Payer: Managed Care, Other (non HMO) | Attending: Cardiovascular Disease | Admitting: Radiology

## 2012-11-17 DIAGNOSIS — R9431 Abnormal electrocardiogram [ECG] [EKG]: Secondary | ICD-10-CM | POA: Insufficient documentation

## 2012-11-17 DIAGNOSIS — R072 Precordial pain: Secondary | ICD-10-CM

## 2012-11-17 DIAGNOSIS — R079 Chest pain, unspecified: Secondary | ICD-10-CM | POA: Insufficient documentation

## 2012-11-17 NOTE — Progress Notes (Signed)
Echocardiogram performed.  

## 2012-11-28 ENCOUNTER — Ambulatory Visit (HOSPITAL_COMMUNITY): Payer: Managed Care, Other (non HMO) | Attending: Cardiology | Admitting: Radiology

## 2012-11-28 VITALS — BP 135/87 | HR 56 | Ht 68.5 in | Wt 196.0 lb

## 2012-11-28 DIAGNOSIS — R0789 Other chest pain: Secondary | ICD-10-CM | POA: Insufficient documentation

## 2012-11-28 DIAGNOSIS — R5381 Other malaise: Secondary | ICD-10-CM | POA: Insufficient documentation

## 2012-11-28 DIAGNOSIS — E785 Hyperlipidemia, unspecified: Secondary | ICD-10-CM | POA: Insufficient documentation

## 2012-11-28 DIAGNOSIS — R112 Nausea with vomiting, unspecified: Secondary | ICD-10-CM | POA: Insufficient documentation

## 2012-11-28 DIAGNOSIS — M25519 Pain in unspecified shoulder: Secondary | ICD-10-CM | POA: Insufficient documentation

## 2012-11-28 DIAGNOSIS — R079 Chest pain, unspecified: Secondary | ICD-10-CM

## 2012-11-28 DIAGNOSIS — I4949 Other premature depolarization: Secondary | ICD-10-CM

## 2012-11-28 DIAGNOSIS — F17200 Nicotine dependence, unspecified, uncomplicated: Secondary | ICD-10-CM | POA: Insufficient documentation

## 2012-11-28 DIAGNOSIS — R9431 Abnormal electrocardiogram [ECG] [EKG]: Secondary | ICD-10-CM

## 2012-11-28 DIAGNOSIS — R5383 Other fatigue: Secondary | ICD-10-CM | POA: Insufficient documentation

## 2012-11-28 MED ORDER — TECHNETIUM TC 99M SESTAMIBI GENERIC - CARDIOLITE
30.0000 | Freq: Once | INTRAVENOUS | Status: AC | PRN
  Administered 2012-11-28: 30 via INTRAVENOUS

## 2012-11-28 MED ORDER — TECHNETIUM TC 99M SESTAMIBI GENERIC - CARDIOLITE
10.0000 | Freq: Once | INTRAVENOUS | Status: AC | PRN
  Administered 2012-11-28: 10 via INTRAVENOUS

## 2012-11-28 NOTE — Progress Notes (Signed)
[facility] SITE 3 NUCLEAR MED [address] [place], [place] [address] [phone]    Cardiology Nuclear Med Study  Matthew Patterson is a 57 y.o. male     MRN : [id number]     DOB: 12-Mar-1956  Procedure Date: 11/28/2012  Nuclear Med Background Indication for Stress Test:  Evaluation for Ischemia History:  11/17/12 Echo:EF=60% Cardiac Risk Factors: Lipids and Smoker-Occ. Cigar  Symptoms:  Chest Tightness and (B) Shoulder Pain (last episode of chest discomfort was about a month ago), Fatigue, Nausea and Vomiting   Nuclear Pre-Procedure Caffeine/Decaff Intake:  None NPO After: 7:00pm   Lungs:  clear O2 Sat: 98% on room air. IV 0.9% NS with Angio Cath:  20g  IV Site: R Antecubital  IV Started by:  Stanton Kidney, EMT-P  Chest Size (in):  44 Cup Size: n/a  Height: 5' 8.5" (1.74 m)  Weight:  196 lb (88.905 kg)  BMI:  Body mass index is 29.36 kg/(m^2). Tech Comments:  NA    Nuclear Med Study 1 or 2 day study: 1 day  Stress Test Type:  Stress  Reading MD: Olga Millers, MD  Order Authorizing Provider:  Kristeen Miss, MD  Resting Radionuclide: Technetium 61m Sestamibi  Resting Radionuclide Dose: 11.0 mCi   Stress Radionuclide:  Technetium 44m Sestamibi  Stress Radionuclide Dose: 33.0 mCi           Stress Protocol Rest HR: 56 Stress HR: 151  Rest BP: 135/87 Stress BP: 224/102  Exercise Time (min): 8:18 METS: 10.1   Predicted Max HR: 163 bpm % Max HR: 92.64 bpm Rate Pressure Product: 21308   Dose of Adenosine (mg):  n/a Dose of Lexiscan: n/a mg  Dose of Atropine (mg): n/a Dose of Dobutamine: n/a mcg/kg/min (at max HR)  Stress Test Technologist: Smiley Houseman, CMA-N  Nuclear Technologist:  Domenic Polite, CNMT     Rest Procedure:  Myocardial perfusion imaging was performed at rest 45 minutes following the intravenous administration of Technetium 81m Sestamibi.  Rest ECG: Sinus bradycardia, no ST changes  Stress Procedure:  The patient exercised on the treadmill  utilizing the Bruce Protocol for 8:18 minutes. The patient stopped due to fatigue and denied any chest pain.  Technetium 49m Sestamibi was injected at peak exercise and myocardial perfusion imaging was performed after a brief delay.  Stress ECG: No significant ST segment change suggestive of ischemia.  QPS Raw Data Images:  Acquisition technically good; normal left ventricular size. Stress Images:  Normal homogeneous uptake in all areas of the myocardium. Rest Images:  Normal homogeneous uptake in all areas of the myocardium. Subtraction (SDS):  No evidence of ischemia. Transient Ischemic Dilatation (Normal <1.22):  0.98 Lung/Heart Ratio (Normal <0.45):  0.36  Quantitative Gated Spect Images QGS EDV:  64 ml QGS ESV:  25 ml  Impression Exercise Capacity:  Fair exercise capacity. BP Response:  Hypertensive blood pressure response. Clinical Symptoms:  There is dyspnea. ECG Impression:  No significant ST segment change suggestive of ischemia. Comparison with Prior Nuclear Study: No previous nuclear study performed  Overall Impression:  Normal stress nuclear study.  LV Ejection Fraction: 60%.  LV Wall Motion:  NL LV Function; NL Wall Motion  Olga Millers

## 2012-12-29 ENCOUNTER — Ambulatory Visit: Payer: Managed Care, Other (non HMO) | Admitting: Cardiovascular Disease

## 2013-02-09 ENCOUNTER — Other Ambulatory Visit: Payer: Self-pay | Admitting: Family Medicine

## 2013-02-09 NOTE — Telephone Encounter (Signed)
Will forward to PCP 

## 2013-03-07 ENCOUNTER — Other Ambulatory Visit: Payer: Self-pay | Admitting: Family Medicine

## 2013-04-21 ENCOUNTER — Ambulatory Visit (INDEPENDENT_AMBULATORY_CARE_PROVIDER_SITE_OTHER): Payer: Managed Care, Other (non HMO) | Admitting: Family Medicine

## 2013-04-21 ENCOUNTER — Encounter: Payer: Self-pay | Admitting: Family Medicine

## 2013-04-21 VITALS — BP 130/82 | Temp 98.5°F | Ht 69.0 in | Wt 198.0 lb

## 2013-04-21 DIAGNOSIS — K219 Gastro-esophageal reflux disease without esophagitis: Secondary | ICD-10-CM

## 2013-04-21 DIAGNOSIS — F32A Depression, unspecified: Secondary | ICD-10-CM

## 2013-04-21 DIAGNOSIS — Z0000 Encounter for general adult medical examination without abnormal findings: Secondary | ICD-10-CM

## 2013-04-21 DIAGNOSIS — F329 Major depressive disorder, single episode, unspecified: Secondary | ICD-10-CM

## 2013-04-21 DIAGNOSIS — E781 Pure hyperglyceridemia: Secondary | ICD-10-CM

## 2013-04-21 DIAGNOSIS — F341 Dysthymic disorder: Secondary | ICD-10-CM

## 2013-04-21 DIAGNOSIS — F4389 Other reactions to severe stress: Secondary | ICD-10-CM

## 2013-04-21 DIAGNOSIS — F438 Other reactions to severe stress: Secondary | ICD-10-CM

## 2013-04-21 DIAGNOSIS — Z23 Encounter for immunization: Secondary | ICD-10-CM

## 2013-04-21 LAB — LIPID PANEL
HDL: 41.4 mg/dL (ref >39.00)
VLDL: 107.8 mg/dL — ABNORMAL HIGH (ref 0.0–40.0)

## 2013-04-21 LAB — LDL CHOLESTEROL, DIRECT: Direct LDL: 115.5 mg/dL

## 2013-04-21 MED ORDER — CLONAZEPAM 0.5 MG PO TABS: 0.5000 mg | ORAL_TABLET | Freq: Two times a day (BID) | ORAL | Status: DC

## 2013-04-21 MED ORDER — CITALOPRAM HYDROBROMIDE 20 MG PO TABS: ORAL_TABLET | ORAL | Status: DC

## 2013-04-21 MED ORDER — OMEPRAZOLE 20 MG PO CPDR: DELAYED_RELEASE_CAPSULE | ORAL | Status: DC

## 2013-04-21 NOTE — Patient Instructions (Signed)
-  We have ordered labs or studies at this visit. It can take up to 1-2 weeks for results and processing. We will contact you with instructions IF your results are abnormal. Normal results will be released to your MYCHART. If you have not heard from us or can not find your results in MYCHART in 2 weeks please contact our office.  -PLEASE SIGN UP FOR MYCHART TODAY   We recommend the following healthy lifestyle measures: - eat a healthy diet consisting of lots of vegetables, fruits, beans, nuts, seeds, healthy meats such as white chicken and fish and whole grains.  - avoid fried foods, fast food, processed foods, sodas, red meet and other fattening foods.  - get a least 150 minutes of aerobic exercise per week.   Follow up in: 3-4 months  

## 2013-04-21 NOTE — Progress Notes (Signed)
Chief Complaint  Patient presents with  . Annual Exam    HPI:  57 yo M patient of Dr. Tawanna Cooler here for annual exam.   Concerns/Chronic issues: 1)Hypertrig: -not on medications for this -worked on diet and exercise after last check  2)GERD: -on omeprazole, stable -needs refills -occ symptoms and take his medication sporadically  3)Depression/ANxiety -on celexa, takes klonopin a few times per week ROS: See pertinent positives and negatives per HPI -stable   Health maintenance: -diet and exercise: walks several days per week, golfs, walks dog daily, diet is healthy -alcohol, drugs, substance abuse: none -STI/Hep C screening: does no -lipids and diabetes screening: done 10/2012 - elevated trig -vaccines: offered tdap and flu  -colon cancer screening: reports had colonoscopy 2-3 years ago and told normal and repeat 10 years ago -prostate cancer screening: discussed, patient does not wish to do this  ROS negative except where mentioned above.  No past medical history on file.  Past Surgical History  Procedure Laterality Date  . Rotator cuff repair    . Shoulder surgery  2010    B/L    Family History  Problem Relation Age of Onset  . Diabetes Mother   . Hypertension Mother   . Cancer Sister     uterine cancer    History   Social History  . Marital Status: Married    Spouse Name: N/A    Number of Children: N/A  . Years of Education: N/A   Social History Main Topics  . Smoking status: Never Smoker   . Smokeless tobacco: None  . Alcohol Use: Yes  . Drug Use: No  . Sexual Activity:    Other Topics Concern  . None   Social History Narrative  . None    Current outpatient prescriptions:citalopram (CELEXA) 20 MG tablet, TAKE ONE AND ONE-HALF TABLET BY MOUTH AT BEDTIME, Disp: 90 tablet, Rfl: 1;  clonazePAM (KLONOPIN) 0.5 MG tablet, Take 1 tablet (0.5 mg total) by mouth 2 (two) times daily., Disp: 60 tablet, Rfl: 0;  omeprazole (PRILOSEC) 20 MG capsule, Take one  capsule by mouth twice daily for 3 weeks then one before evening meal, Disp: 90 capsule, Rfl: 2 guaiFENesin-codeine (ROBITUSSIN AC) 100-10 MG/5ML syrup, Take 5 mLs by mouth 3 (three) times daily as needed for cough., Disp: 120 mL, Rfl: 0  EXAM:  Filed Vitals:   04/21/13 0758  BP: 130/82  Temp: 98.5 F (36.9 C)    Body mass index is 29.23 kg/(m^2).  GENERAL: vitals reviewed and listed above, alert, oriented, appears well hydrated and in no acute distress  HEENT: atraumatic, conjunttiva clear, no obvious abnormalities on inspection of external nose and ears  NECK: no obvious masses on inspection  LUNGS: clear to auscultation bilaterally, no wheezes, rales or rhonchi, good air movement  CV: HRRR, no peripheral edema  ABD: soft, NTTP  SKIN: no abnormal lesions noted on back or face or on brief exam  MS: moves all extremities without noticeable abnormality  PSYCH: pleasant and cooperative, no obvious depression or anxiety  GU: deferred  ASSESSMENT AND PLAN:  Discussed the following assessment and plan:  Visit for preventive health examination  Need for prophylactic vaccination and inoculation against influenza - Plan: Flu Vaccine QUAD 36+ mos PF IM (Fluarix)  Other acute reactions to stress - Plan: clonazePAM (KLONOPIN) 0.5 MG tablet, DISCONTINUED: clonazePAM (KLONOPIN) 0.5 MG tablet  Anxiety and depression - Plan: citalopram (CELEXA) 20 MG tablet, clonazePAM (KLONOPIN) 0.5 MG tablet, DISCONTINUED: clonazePAM (KLONOPIN) 0.5 MG  tablet  GERD (gastroesophageal reflux disease) - Plan: omeprazole (PRILOSEC) 20 MG capsule  Hypertriglyceridemia - Plan: Lipid Panel  -all level A and B USPSTF recs discussed/offered  -labs done in last year  -offered flu and tdap vaccines: done today  -discussed colon cancer and prostate screening options: he had colonoscopy in 2011, he deferred prostate cancer screening today after discussion risks and benefits  -offerd hep C and STI  screening: refused  -counseled on healthy diet and exercise  -refilled medications  -FASTING LIPIDs  -follow up with PCP in 3-4 months  -Patient advised to return or notify a doctor immediately if symptoms worsen or persist or new concerns arise.  Patient Instructions  -We have ordered labs or studies at this visit. It can take up to 1-2 weeks for results and processing. We will contact you with instructions IF your results are abnormal. Normal results will be released to your [facility]. If you have not heard from [place] or can not find your results in [facility] in 2 weeks please contact our office.  -PLEASE SIGN UP FOR MYCHART TODAY   We recommend the following healthy lifestyle measures: - eat a healthy diet consisting of lots of vegetables, fruits, beans, nuts, seeds, healthy meats such as white chicken and fish and whole grains.  - avoid fried foods, fast food, processed foods, sodas, red meet and other fattening foods.  - get a least 150 minutes of aerobic exercise per week.   Follow up in: 3-4 months      KIM, HANNAH R.

## 2013-04-21 NOTE — Progress Notes (Signed)
Quick Note:  Called and spoke with pt and pt is aware. ______ 

## 2013-04-21 NOTE — Progress Notes (Signed)
Quick Note:  Left a message for return call. ______ 

## 2013-05-20 ENCOUNTER — Other Ambulatory Visit: Payer: Self-pay | Admitting: Family Medicine

## 2013-08-14 NOTE — Telephone Encounter (Signed)
Opened in error

## 2013-09-27 ENCOUNTER — Telehealth: Payer: Self-pay | Admitting: Family Medicine

## 2013-09-27 DIAGNOSIS — F329 Major depressive disorder, single episode, unspecified: Secondary | ICD-10-CM

## 2013-09-27 DIAGNOSIS — F32A Depression, unspecified: Secondary | ICD-10-CM

## 2013-09-27 DIAGNOSIS — K219 Gastro-esophageal reflux disease without esophagitis: Secondary | ICD-10-CM

## 2013-09-27 DIAGNOSIS — F419 Anxiety disorder, unspecified: Secondary | ICD-10-CM

## 2013-09-27 MED ORDER — CITALOPRAM HYDROBROMIDE 20 MG PO TABS: ORAL_TABLET | ORAL | Status: DC

## 2013-09-27 MED ORDER — OMEPRAZOLE 20 MG PO CPDR: DELAYED_RELEASE_CAPSULE | ORAL | Status: DC

## 2013-09-27 MED ORDER — CLONAZEPAM 0.5 MG PO TABS: ORAL_TABLET | ORAL | Status: DC

## 2013-09-27 NOTE — Telephone Encounter (Signed)
Rx called sent to pharmacy.  

## 2013-09-27 NOTE — Telephone Encounter (Signed)
Pt request refill omeprazole (PRILOSEC) 20 MG capsule clonazePAM (KLONOPIN) 0.5 MG tablet citalopram (CELEXA) 20 MG tablet [facility]: [facility] (from now on)

## 2014-01-29 ENCOUNTER — Ambulatory Visit (INDEPENDENT_AMBULATORY_CARE_PROVIDER_SITE_OTHER): Payer: Managed Care, Other (non HMO) | Admitting: Family Medicine

## 2014-01-29 ENCOUNTER — Ambulatory Visit (INDEPENDENT_AMBULATORY_CARE_PROVIDER_SITE_OTHER)
Admission: RE | Admit: 2014-01-29 | Discharge: 2014-01-29 | Disposition: A | Discharge status: Home / Self Care | Payer: Managed Care, Other (non HMO) | Source: Ambulatory Visit | Attending: Family Medicine | Admitting: Family Medicine

## 2014-01-29 ENCOUNTER — Encounter: Payer: Self-pay | Admitting: Family Medicine

## 2014-01-29 VITALS — BP 180/110 | Temp 99.1°F | Wt 205.0 lb

## 2014-01-29 DIAGNOSIS — M5030 Other cervical disc degeneration, unspecified cervical region: Secondary | ICD-10-CM

## 2014-01-29 MED ORDER — CYCLOBENZAPRINE HCL 5 MG PO TABS: 5.0000 mg | ORAL_TABLET | Freq: Three times a day (TID) | ORAL | Status: DC | PRN

## 2014-01-29 MED ORDER — PREDNISONE 20 MG PO TABS: ORAL_TABLET | ORAL | Status: DC

## 2014-01-29 MED ORDER — TRAMADOL HCL 50 MG PO TABS: 50.0000 mg | ORAL_TABLET | Freq: Three times a day (TID) | ORAL | Status: DC | PRN

## 2014-01-29 NOTE — Patient Instructions (Signed)
Flexeril and tramadol........... one half to one of each 3 times daily...............  When you're working just take a half or full tablet of each at bedtime  Bed rest for the next 2 days at home  Return on Wednesday morning for followup  X-rays of your neck today at the main office  We will also get you set up for physical therapy ASAP.

## 2014-01-29 NOTE — Progress Notes (Signed)
   Subjective:    Patient ID: Matthew Patterson, male    DOB: 1955/12/30, 58 y.o.   MRN: [id number]  HPI Matthew Patterson is a 58 year old married male nonsmoker who comes in today for evaluation of 2 problems  For the past 6 weeks he's had pain in the right side of his neck. It started gradually. No history of trauma. Now the pain is constant and 9-1/2 on a scale of 1-10. It seems to radiate down his arm he also noticed some tingling of his arm and he feels like his muscle strength is diminished. In the last day or 2 he's noticed some weakness in his right shoulder we had his shoulder surgery a couple years ago I [facility].  Neurologic review of systems otherwise negative   Review of Systems Well-developed well-nourished male no acute distress vital signs stable he is afebrile except for BP 180/110. He's never had hypertension. He thinks his neck pain is causing his blood pressure elevation    Objective:   Physical Exam  Well-developed and nourished male no acute distress vital signs stable he is afebrile except for BP 180/110. He's never had hypertension. He feels his neck pain is causing his blood pressure elevation.  HEENT negative neck was supple except for pain in the right side of his neck with movement.  Upper extremities show normal sensation strength and reflexes.  He is flailing his right shoulder      Assessment & Plan:  Cervical disc disease...Marland KitchenMarland Kitchen bed rest for 48 hours ...... Flexeril and tramadol ....... Short course of prednisone.......... set up physical therapy ........ baseline x-rays of his neck  Right shoulder pain referred back to orthopedist who did his surgery .....Marland KitchenMarland Kitchen

## 2014-01-29 NOTE — Progress Notes (Signed)
Pre visit review using our clinic review tool, if applicable. No additional management support is needed unless otherwise documented below in the visit note. 

## 2014-01-31 ENCOUNTER — Ambulatory Visit (INDEPENDENT_AMBULATORY_CARE_PROVIDER_SITE_OTHER): Payer: Managed Care, Other (non HMO) | Admitting: Family Medicine

## 2014-01-31 ENCOUNTER — Encounter: Payer: Self-pay | Admitting: Family Medicine

## 2014-01-31 VITALS — BP 140/94 | Temp 98.4°F | Wt 204.0 lb

## 2014-01-31 DIAGNOSIS — M5030 Other cervical disc degeneration, unspecified cervical region: Secondary | ICD-10-CM

## 2014-01-31 NOTE — Progress Notes (Signed)
Pre visit review using our clinic review tool, if applicable. No additional management support is needed unless otherwise documented below in the visit note. 

## 2014-01-31 NOTE — Patient Instructions (Signed)
Taper the prednisone as outlined  When she finished with the prednisone if you still have some soreness in your neck begin Motrin 400 twice a day with food  Flexeril and tramadol................ one half to one of each at bedtime  PT consult ASAP  Return when necessary  Go bucks!!!!!!!!!!!!!.

## 2014-01-31 NOTE — Progress Notes (Signed)
   Subjective:    Patient ID: Matthew Patterson, male    DOB: 03-31-1956, 58 y.o.   MRN: [id number]  HPI Matthew Patterson is a 58 year old male who comes in today for followup of cervical pain  We saw him on Monday with severe pain. Neurologic exam was normal. Replacement bedrest at home started prednisone 40 mg daily and Flexeril and tramadol 3 times a day for pain. He comes back today saying he 75% better. His pain is dropped from a 10 to a 3.   Review of Systems    review of systems otherwise negative Objective:   Physical Exam  Well-developed well-nourished male no acute distress vital signs stable is afebrile neurologic exam again normal      Assessment & Plan:  Cervical disc disease resolving with conservative therapy,,,,,,,,,,, continue conservative therapy,,,,,,, okay to go back to work,,,,,,,

## 2014-02-07 ENCOUNTER — Ambulatory Visit (INDEPENDENT_AMBULATORY_CARE_PROVIDER_SITE_OTHER): Payer: Managed Care, Other (non HMO) | Admitting: Physical Therapy

## 2014-02-07 DIAGNOSIS — M6281 Muscle weakness (generalized): Secondary | ICD-10-CM

## 2014-02-07 DIAGNOSIS — M5030 Other cervical disc degeneration, unspecified cervical region: Secondary | ICD-10-CM

## 2014-02-07 DIAGNOSIS — M542 Cervicalgia: Secondary | ICD-10-CM

## 2014-02-07 DIAGNOSIS — M2560 Stiffness of unspecified joint, not elsewhere classified: Secondary | ICD-10-CM

## 2014-02-12 ENCOUNTER — Encounter (INDEPENDENT_AMBULATORY_CARE_PROVIDER_SITE_OTHER): Payer: Managed Care, Other (non HMO) | Admitting: Physical Therapy

## 2014-02-12 DIAGNOSIS — M5030 Other cervical disc degeneration, unspecified cervical region: Secondary | ICD-10-CM

## 2014-02-12 DIAGNOSIS — M542 Cervicalgia: Secondary | ICD-10-CM

## 2014-02-12 DIAGNOSIS — M2560 Stiffness of unspecified joint, not elsewhere classified: Secondary | ICD-10-CM

## 2014-02-12 DIAGNOSIS — M6281 Muscle weakness (generalized): Secondary | ICD-10-CM

## 2014-02-14 ENCOUNTER — Encounter (INDEPENDENT_AMBULATORY_CARE_PROVIDER_SITE_OTHER): Payer: Managed Care, Other (non HMO) | Admitting: Physical Therapy

## 2014-02-14 DIAGNOSIS — M6281 Muscle weakness (generalized): Secondary | ICD-10-CM

## 2014-02-14 DIAGNOSIS — M2560 Stiffness of unspecified joint, not elsewhere classified: Secondary | ICD-10-CM

## 2014-02-14 DIAGNOSIS — M5030 Other cervical disc degeneration, unspecified cervical region: Secondary | ICD-10-CM

## 2014-02-14 DIAGNOSIS — M542 Cervicalgia: Secondary | ICD-10-CM

## 2014-02-21 ENCOUNTER — Encounter (INDEPENDENT_AMBULATORY_CARE_PROVIDER_SITE_OTHER): Payer: Managed Care, Other (non HMO) | Admitting: Physical Therapy

## 2014-02-21 DIAGNOSIS — M5030 Other cervical disc degeneration, unspecified cervical region: Secondary | ICD-10-CM

## 2014-02-21 DIAGNOSIS — M2560 Stiffness of unspecified joint, not elsewhere classified: Secondary | ICD-10-CM

## 2014-02-21 DIAGNOSIS — M6281 Muscle weakness (generalized): Secondary | ICD-10-CM

## 2014-02-21 DIAGNOSIS — M542 Cervicalgia: Secondary | ICD-10-CM

## 2014-02-26 ENCOUNTER — Encounter (INDEPENDENT_AMBULATORY_CARE_PROVIDER_SITE_OTHER): Payer: Managed Care, Other (non HMO) | Admitting: Physical Therapy

## 2014-02-26 DIAGNOSIS — M542 Cervicalgia: Secondary | ICD-10-CM

## 2014-02-26 DIAGNOSIS — M2560 Stiffness of unspecified joint, not elsewhere classified: Secondary | ICD-10-CM

## 2014-02-26 DIAGNOSIS — M5030 Other cervical disc degeneration, unspecified cervical region: Secondary | ICD-10-CM

## 2014-02-26 DIAGNOSIS — M6281 Muscle weakness (generalized): Secondary | ICD-10-CM

## 2014-02-28 ENCOUNTER — Encounter: Payer: Managed Care, Other (non HMO) | Admitting: Physical Therapy

## 2014-07-05 ENCOUNTER — Other Ambulatory Visit: Payer: Self-pay | Admitting: Family Medicine

## 2014-10-27 ENCOUNTER — Other Ambulatory Visit: Payer: Self-pay | Admitting: Family Medicine

## 2015-02-05 ENCOUNTER — Other Ambulatory Visit: Payer: Self-pay | Admitting: Family Medicine

## 2015-02-06 ENCOUNTER — Ambulatory Visit (INDEPENDENT_AMBULATORY_CARE_PROVIDER_SITE_OTHER): Payer: Managed Care, Other (non HMO) | Admitting: Internal Medicine

## 2015-02-06 ENCOUNTER — Encounter: Payer: Self-pay | Admitting: Internal Medicine

## 2015-02-06 VITALS — BP 136/90 | HR 54 | Temp 98.1°F | Resp 20 | Ht 69.0 in | Wt 200.0 lb

## 2015-02-06 DIAGNOSIS — G4700 Insomnia, unspecified: Secondary | ICD-10-CM

## 2015-02-06 DIAGNOSIS — R1030 Lower abdominal pain, unspecified: Secondary | ICD-10-CM | POA: Diagnosis not present

## 2015-02-06 DIAGNOSIS — R1031 Right lower quadrant pain: Secondary | ICD-10-CM

## 2015-02-06 MED ORDER — CITALOPRAM HYDROBROMIDE 20 MG PO TABS: 20.0000 mg | ORAL_TABLET | Freq: Every day | ORAL | Status: DC

## 2015-02-06 MED ORDER — TRAZODONE HCL 50 MG PO TABS: 25.0000 mg | ORAL_TABLET | Freq: Every evening | ORAL | Status: DC | PRN

## 2015-02-06 NOTE — Progress Notes (Signed)
Subjective:    Patient ID: Matthew Patterson, male    DOB: 1956/06/29, 59 y.o.   MRN: [id number]  HPI  59 year old patient who presents with 2 concerns.  His chief complaint is right inguinal pain that is aggravated by activities such as playing golf. He also describes some tenderness of the left nipple.  Both symptoms have been present for about 3 months. He complains of chronic insomnia but has not been taking citalopram.  Klonopin causes excessive daytime sleepiness.  No past medical history on file.  History   Social History  . Marital Status: Married    Spouse Name: N/A  . Number of Children: N/A  . Years of Education: N/A   Occupational History  . Not on file.   Social History Main Topics  . Smoking status: Never Smoker   . Smokeless tobacco: Not on file  . Alcohol Use: Yes  . Drug Use: No  . Sexual Activity: Not on file   Other Topics Concern  . Not on file   Social History Narrative    Past Surgical History  Procedure Laterality Date  . Rotator cuff repair    . Shoulder surgery  2010    B/L    Family History  Problem Relation Age of Onset  . Diabetes Mother   . Hypertension Mother   . Cancer Sister     uterine cancer    No Known Allergies  Current Outpatient Prescriptions on File Prior to Visit  Medication Sig Dispense Refill  . clonazePAM (KLONOPIN) 0.5 MG tablet Take one tablet by mouth twice daily 180 tablet 1  . omeprazole (PRILOSEC) 20 MG capsule TAKE ONE CAPSULE BY MOUTH TWICE DAILY FOR THREE WEEKS THEN ONE CAPSULE BEFORE EVENING MEAL 30 capsule 0   No current facility-administered medications on file prior to visit.    BP 136/90 mmHg  Pulse 54  Temp(Src) 98.1 F (36.7 C) (Oral)  Resp 20  Ht 5\' 9"  (1.753 m)  Wt 200 lb (90.719 kg)  BMI 29.52 kg/m2  SpO2 98%     Review of Systems  Constitutional: Negative for fever, chills, appetite change and fatigue.  HENT: Negative for congestion, dental problem, ear pain, hearing loss, sore  throat, tinnitus, trouble swallowing and voice change.   Eyes: Negative for pain, discharge and visual disturbance.  Respiratory: Negative for cough, chest tightness, wheezing and stridor.   Cardiovascular: Negative for chest pain, palpitations and leg swelling.  Gastrointestinal: Positive for abdominal pain. Negative for nausea, vomiting, diarrhea, constipation, blood in stool and abdominal distention.  Genitourinary: Negative for urgency, hematuria, flank pain, discharge, difficulty urinating and genital sores.  Musculoskeletal: Negative for myalgias, back pain, joint swelling, arthralgias, gait problem and neck stiffness.  Skin: Negative for rash.  Neurological: Negative for dizziness, syncope, speech difficulty, weakness, numbness and headaches.  Hematological: Negative for adenopathy. Does not bruise/bleed easily.  Psychiatric/Behavioral: Positive for sleep disturbance. Negative for behavioral problems and dysphoric mood. The patient is not nervous/anxious.        Objective:   Physical Exam  Constitutional: He appears well-developed and well-nourished. No distress.  Pulmonary/Chest:  No abnormalities of the left breast  Genitourinary:   Slight bulging in the right inguinal area, but no obvious hernia          Assessment & Plan:   Right inguinal pain.  Strain versus mild hernia.  He is scheduled for a physical later this month.  We'll reassess at that time Insomnia.  Will give a trial  of trazodone.  Will resume citalopram, which she has not been taking reassess later this month

## 2015-02-06 NOTE — Progress Notes (Signed)
Pre visit review using our clinic review tool, if applicable. No additional management support is needed unless otherwise documented below in the visit note. 

## 2015-02-06 NOTE — Patient Instructions (Addendum)
Return as scheduled for your annual exam  Report any new or worsening symptoms

## 2015-02-18 ENCOUNTER — Other Ambulatory Visit (INDEPENDENT_AMBULATORY_CARE_PROVIDER_SITE_OTHER): Payer: Managed Care, Other (non HMO)

## 2015-02-18 DIAGNOSIS — R7989 Other specified abnormal findings of blood chemistry: Secondary | ICD-10-CM

## 2015-02-18 DIAGNOSIS — Z0000 Encounter for general adult medical examination without abnormal findings: Secondary | ICD-10-CM | POA: Diagnosis not present

## 2015-02-18 LAB — LIPID PANEL
Cholesterol: 266 mg/dL — ABNORMAL HIGH (ref 0–200)
HDL: 31.7 mg/dL — AB (ref >39.00)
TRIGLYCERIDES: 1362 mg/dL — AB (ref 0.0–149.0)
Total CHOL/HDL Ratio: 8

## 2015-02-18 LAB — CBC WITH DIFFERENTIAL/PLATELET
BASOS ABS: 0 10*3/uL (ref 0.0–0.1)
BASOS PCT: 0.6 % (ref 0.0–3.0)
Eosinophils Absolute: 0.1 10*3/uL (ref 0.0–0.7)
Eosinophils Relative: 2 % (ref 0.0–5.0)
HCT: 42.8 % (ref 39.0–52.0)
HEMOGLOBIN: 15.1 g/dL (ref 13.0–17.0)
Lymphocytes Relative: 26.7 % (ref 12.0–46.0)
Lymphs Abs: 1.5 10*3/uL (ref 0.7–4.0)
MCHC: 35.3 g/dL (ref 30.0–36.0)
MCV: 92 fl (ref 78.0–100.0)
MONO ABS: 0.2 10*3/uL (ref 0.1–1.0)
MONOS PCT: 3.7 % (ref 3.0–12.0)
NEUTROS ABS: 3.7 10*3/uL (ref 1.4–7.7)
Neutrophils Relative %: 67 % (ref 43.0–77.0)
PLATELETS: 218 10*3/uL (ref 150.0–400.0)
RBC: 4.66 Mil/uL (ref 4.22–5.81)
RDW: 12.5 % (ref 11.5–15.5)
WBC: 5.5 10*3/uL (ref 4.0–10.5)

## 2015-02-18 LAB — HEPATIC FUNCTION PANEL
ALBUMIN: 4.1 g/dL (ref 3.5–5.2)
ALT: 19 U/L (ref 0–53)
AST: 15 U/L (ref 0–37)
Alkaline Phosphatase: 71 U/L (ref 39–117)
BILIRUBIN TOTAL: 0.5 mg/dL (ref 0.2–1.2)
Bilirubin, Direct: 0.1 mg/dL (ref 0.0–0.3)
TOTAL PROTEIN: 6.5 g/dL (ref 6.0–8.3)

## 2015-02-18 LAB — TSH: TSH: 1.45 u[IU]/mL (ref 0.35–4.50)

## 2015-02-18 LAB — POCT URINALYSIS DIPSTICK
BILIRUBIN UA: NEGATIVE
Ketones, UA: NEGATIVE
LEUKOCYTES UA: NEGATIVE
NITRITE UA: NEGATIVE
PH UA: 5.5
Protein, UA: NEGATIVE
RBC UA: NEGATIVE
Spec Grav, UA: 1.02
UROBILINOGEN UA: 0.2

## 2015-02-18 LAB — BASIC METABOLIC PANEL
BUN: 25 mg/dL — ABNORMAL HIGH (ref 6–23)
CO2: 27 mEq/L (ref 19–32)
Calcium: 8.9 mg/dL (ref 8.4–10.5)
Chloride: 102 mEq/L (ref 96–112)
Creatinine, Ser: 1.12 mg/dL (ref 0.40–1.50)
GFR: 71.26 mL/min (ref >60.00)
Glucose, Bld: 190 mg/dL — ABNORMAL HIGH (ref 70–99)
Potassium: 4 mEq/L (ref 3.5–5.1)
Sodium: 138 mEq/L (ref 135–145)

## 2015-02-18 LAB — LDL CHOLESTEROL, DIRECT: Direct LDL: 97 mg/dL

## 2015-02-18 LAB — PSA: PSA: 0.74 ng/mL (ref 0.10–4.00)

## 2015-02-18 LAB — HEMOGLOBIN A1C: HEMOGLOBIN A1C: 5.9 % (ref 4.6–6.5)

## 2015-02-18 NOTE — Addendum Note (Signed)
Addended by: Elmer Picker on: 02/18/2015 09:39 AM   Modules accepted: Orders

## 2015-02-20 ENCOUNTER — Other Ambulatory Visit: Payer: Self-pay | Admitting: *Deleted

## 2015-02-20 MED ORDER — FENOFIBRATE 145 MG PO TABS: 145.0000 mg | ORAL_TABLET | Freq: Every day | ORAL | Status: DC

## 2015-02-25 ENCOUNTER — Encounter: Payer: Self-pay | Admitting: Internal Medicine

## 2015-02-25 ENCOUNTER — Ambulatory Visit (INDEPENDENT_AMBULATORY_CARE_PROVIDER_SITE_OTHER): Payer: Managed Care, Other (non HMO) | Admitting: Internal Medicine

## 2015-02-25 VITALS — BP 130/82 | HR 59 | Temp 98.5°F | Resp 20 | Ht 68.75 in | Wt 203.0 lb

## 2015-02-25 DIAGNOSIS — K2100 Gastro-esophageal reflux disease with esophagitis, without bleeding: Secondary | ICD-10-CM

## 2015-02-25 DIAGNOSIS — R7302 Impaired glucose tolerance (oral): Secondary | ICD-10-CM | POA: Insufficient documentation

## 2015-02-25 DIAGNOSIS — Z0000 Encounter for general adult medical examination without abnormal findings: Secondary | ICD-10-CM | POA: Diagnosis not present

## 2015-02-25 NOTE — Patient Instructions (Signed)

## 2015-02-25 NOTE — Progress Notes (Signed)
Subjective:    Patient ID: Matthew Patterson, male    DOB: [birth year], 59 y.o.   MRN: [id number]  HPI  59 year old patient who is seen today for a preventive health examination.  Family history.  Father died in a motor vehicle accident 36.  Mother age 26 has a history of late onset type 2 diabetes.  5 sisters positive for uterine cancer.  One sister had breast cancer, CAD by late leukemia. Social history married, one son and one grandchild works as a Water quality scientist.  Lifelong nonsmoker  No past medical history on file.  History   Social History  . Marital Status: Married    Spouse Name: N/A  . Number of Children: N/A  . Years of Education: N/A   Occupational History  . Not on file.   Social History Main Topics  . Smoking status: Never Smoker   . Smokeless tobacco: Not on file  . Alcohol Use: Yes  . Drug Use: No  . Sexual Activity: Not on file   Other Topics Concern  . Not on file   Social History Narrative    Past Surgical History  Procedure Laterality Date  . Rotator cuff repair    . Shoulder surgery  2010    B/L    Family History  Problem Relation Age of Onset  . Diabetes Mother   . Hypertension Mother   . Cancer Sister     uterine cancer    No Known Allergies  Current Outpatient Prescriptions on File Prior to Visit  Medication Sig Dispense Refill  . citalopram (CELEXA) 20 MG tablet Take 1 tablet (20 mg total) by mouth daily. 30 tablet 0  . clonazePAM (KLONOPIN) 0.5 MG tablet Take one tablet by mouth twice daily 180 tablet 1  . fenofibrate (TRICOR) 145 MG tablet Take 1 tablet (145 mg total) by mouth daily. 90 tablet 1  . omeprazole (PRILOSEC) 20 MG capsule TAKE ONE CAPSULE BY MOUTH TWICE DAILY FOR THREE WEEKS THEN ONE CAPSULE BEFORE EVENING MEAL 30 capsule 0  . traZODone (DESYREL) 50 MG tablet Take 0.5-1 tablets (25-50 mg total) by mouth at bedtime as needed for sleep. 30 tablet 3   No current facility-administered medications on file prior to visit.      BP 150/90 mmHg  Pulse 59  Temp(Src) 98.5 F (36.9 C) (Oral)  Resp 20  Ht 5' 8.75" (1.746 m)  Wt 203 lb (92.08 kg)  BMI 30.20 kg/m2  SpO2 98%     Review of Systems  Constitutional: Negative for fever, chills, activity change, appetite change and fatigue.  HENT: Negative for congestion, dental problem, ear pain, hearing loss, mouth sores, rhinorrhea, sinus pressure, sneezing, tinnitus, trouble swallowing and voice change.   Eyes: Negative for photophobia, pain, redness and visual disturbance.  Respiratory: Negative for apnea, cough, choking, chest tightness, shortness of breath and wheezing.   Cardiovascular: Negative for chest pain, palpitations and leg swelling.  Gastrointestinal: Negative for nausea, vomiting, abdominal pain, diarrhea, constipation, blood in stool, abdominal distention, anal bleeding and rectal pain.  Genitourinary: Negative for dysuria, urgency, frequency, hematuria, flank pain, decreased urine volume, discharge, penile swelling, scrotal swelling, difficulty urinating, genital sores and testicular pain.  Musculoskeletal: Negative for myalgias, back pain, joint swelling, arthralgias, gait problem, neck pain and neck stiffness.  Skin: Negative for color change, rash and wound.  Neurological: Negative for dizziness, tremors, seizures, syncope, facial asymmetry, speech difficulty, weakness, light-headedness, numbness and headaches.  Hematological: Negative for adenopathy. Does not bruise/bleed easily.  Psychiatric/Behavioral: Positive for sleep disturbance. Negative for suicidal ideas, hallucinations, behavioral problems, confusion, self-injury, dysphoric mood, decreased concentration and agitation. The patient is not nervous/anxious.        Objective:   Physical Exam  Constitutional: He appears well-developed and well-nourished.  Repeat blood pressure 130/82  HENT:  Head: Normocephalic and atraumatic.  Right Ear: External ear normal.  Left Ear: External ear  normal.  Nose: Nose normal.  Mouth/Throat: Oropharynx is clear and moist.  Eyes: Conjunctivae and EOM are normal. Pupils are equal, round, and reactive to light. No scleral icterus.  Neck: Normal range of motion. Neck supple. No JVD present. No thyromegaly present.  Cardiovascular: Regular rhythm, normal heart sounds and intact distal pulses.  Exam reveals no gallop and no friction rub.   No murmur heard. Decreased right dorsalis pedis pulse  Pulmonary/Chest: Effort normal and breath sounds normal. He exhibits no tenderness.  Abdominal: Soft. Bowel sounds are normal. He exhibits no distension and no mass. There is no tenderness.  Genitourinary: Prostate normal and penis normal.  Musculoskeletal: Normal range of motion. He exhibits no edema or tenderness.  Lymphadenopathy:    He has no cervical adenopathy.  Neurological: He is alert. He has normal reflexes. No cranial nerve deficit. Coordination normal.  Skin: Skin is warm and dry. No rash noted.  Psychiatric: He has a normal mood and affect. His behavior is normal.          Assessment & Plan:   Preventive health examination Hypertriglyceridemia.  Will place on type IV diet.  We'll also place on fenofibrate.  Weight loss exercise moderation of alcohol.  All discussed Mild obesity Insomnia, improved Impaired glucose tolerance  Recheck 6 months with fasting lipid profile

## 2015-02-25 NOTE — Progress Notes (Signed)
Pre visit review using our clinic review tool, if applicable. No additional management support is needed unless otherwise documented below in the visit note. 

## 2015-05-08 ENCOUNTER — Other Ambulatory Visit: Payer: Self-pay | Admitting: Family Medicine

## 2015-05-28 ENCOUNTER — Other Ambulatory Visit: Payer: Self-pay | Admitting: Family Medicine

## 2015-07-03 ENCOUNTER — Encounter: Payer: Self-pay | Admitting: Gastroenterology

## 2015-07-03 ENCOUNTER — Other Ambulatory Visit: Payer: Self-pay | Admitting: Internal Medicine

## 2015-08-28 ENCOUNTER — Ambulatory Visit: Payer: Managed Care, Other (non HMO) | Admitting: Family Medicine

## 2015-09-10 ENCOUNTER — Encounter: Payer: Self-pay | Admitting: Family Medicine

## 2015-09-10 ENCOUNTER — Ambulatory Visit (INDEPENDENT_AMBULATORY_CARE_PROVIDER_SITE_OTHER): Payer: Managed Care, Other (non HMO) | Admitting: Family Medicine

## 2015-09-10 VITALS — Temp 98.9°F | Ht 68.75 in | Wt 200.0 lb

## 2015-09-10 DIAGNOSIS — K2100 Gastro-esophageal reflux disease with esophagitis, without bleeding: Secondary | ICD-10-CM

## 2015-09-10 DIAGNOSIS — E781 Pure hyperglyceridemia: Secondary | ICD-10-CM | POA: Diagnosis not present

## 2015-09-10 NOTE — Patient Instructions (Signed)
Stop the Prilosec  If your symptoms recur restart the Prilosec and call and leave a voicemail with Apolonio Schneiders...Marland KitchenMarland KitchenMarland Kitchen extension [phone]...... and we will get you set up to be seen in GI to have your esophagus evaluated  Your physical exam is due the third week in July,,,,,,,,,, Tommi Rumps or Almyra Free or 2 new adult nurse practitioner's or Dr. Martinique

## 2015-09-10 NOTE — Progress Notes (Signed)
Pre visit review using our clinic review tool, if applicable. No additional management support is needed unless otherwise documented below in the visit note. 

## 2015-09-10 NOTE — Progress Notes (Signed)
   Subjective:    Patient ID: Matthew Patterson, male    DOB: [birth year], 60 y.o.   MRN: [id number]  HPI Matthew Patterson is a 60 year old married male nonsmoker who comes in today for follow-up of reflux esophagitis  He saw Dr. Raliegh Ip in the fall because of chest pain. He felt was reflux. He started him on omeprazole twice a day for 3 weeks and then one before his evening meal. Took couple weeks but his symptoms went away and he feels fine. He's a nonsmoker who does not drink excessive alcohol consent caffeine peppermint or does he eat before he lies down at night. No family history of reflux. He's had no difficulty swallowing. No weight loss etc. etc.   Review of Systems    review of systems otherwise negative Objective:   Physical Exam Well-developed well-nourished male no acute distress vital signs stable he is afebrile       Assessment & Plan:  Symptoms consistent with reflux esophagitis no asymptomatic........ stop the Prilosec..... If the symptoms recur restart the Prilosec and call and get set up for upper endoscopy and GI

## 2015-09-19 ENCOUNTER — Other Ambulatory Visit: Payer: Self-pay | Admitting: Family Medicine

## 2016-09-15 ENCOUNTER — Ambulatory Visit (INDEPENDENT_AMBULATORY_CARE_PROVIDER_SITE_OTHER): Payer: Managed Care, Other (non HMO) | Admitting: Adult Health

## 2016-09-15 ENCOUNTER — Encounter: Payer: Self-pay | Admitting: Adult Health

## 2016-09-15 VITALS — BP 140/80 | Temp 98.1°F | Ht 68.75 in | Wt 203.6 lb

## 2016-09-15 DIAGNOSIS — F329 Major depressive disorder, single episode, unspecified: Secondary | ICD-10-CM | POA: Insufficient documentation

## 2016-09-15 DIAGNOSIS — F418 Other specified anxiety disorders: Secondary | ICD-10-CM

## 2016-09-15 DIAGNOSIS — G4700 Insomnia, unspecified: Secondary | ICD-10-CM | POA: Diagnosis not present

## 2016-09-15 DIAGNOSIS — E781 Pure hyperglyceridemia: Secondary | ICD-10-CM | POA: Diagnosis not present

## 2016-09-15 DIAGNOSIS — Z0000 Encounter for general adult medical examination without abnormal findings: Secondary | ICD-10-CM

## 2016-09-15 DIAGNOSIS — F32A Depression, unspecified: Secondary | ICD-10-CM

## 2016-09-15 DIAGNOSIS — F419 Anxiety disorder, unspecified: Secondary | ICD-10-CM

## 2016-09-15 MED ORDER — CLONAZEPAM 0.5 MG PO TABS: 0.5000 mg | ORAL_TABLET | Freq: Two times a day (BID) | ORAL | 0 refills | Status: DC

## 2016-09-15 MED ORDER — TRAZODONE HCL 50 MG PO TABS: 25.0000 mg | ORAL_TABLET | Freq: Every evening | ORAL | 3 refills | Status: DC | PRN

## 2016-09-15 NOTE — Progress Notes (Signed)
Patient presents to clinic today to establish care. He is a pleasant 61 year old male who  has a past medical history of Anxiety and depression; GERD (gastroesophageal reflux disease); and Hyperlipidemia.  Last physical was in July 2016   Acute Concerns: Establish Care   Chronic Issues:  Anxiety and Depression - he feels as though this is well controlled with current medication. He needs a refill of Klonpin   Insomnia  - Is controlled with Trazodone as needed for sleep disturbance. Feels as though this works well for him   Hyperlipidemia - Personal assistant and feels as though this works well for him    Health Maintenance: Dental -- Routine Care  Vision -- Routine Care  Immunizations -- Refused flu Colonoscopy -- 2011- Normal  Diet: Does not follow a specific diet Exercise: Likes to play golf. Does not do formal exercise   Past Medical History:  Diagnosis Date  . Anxiety and depression   . GERD (gastroesophageal reflux disease)   . Hyperlipidemia     Past Surgical History:  Procedure Laterality Date  . ROTATOR CUFF REPAIR    . SHOULDER SURGERY  2010   B/L    Current Outpatient Prescriptions on File Prior to Visit  Medication Sig Dispense Refill  . citalopram (CELEXA) 20 MG tablet Take 1 tablet (20 mg total) by mouth daily. 30 tablet 0  . fenofibrate (TRICOR) 145 MG tablet TAKE ONE TABLET BY MOUTH DAILY 90 tablet 3  . omeprazole (PRILOSEC) 20 MG capsule TAKE ONE CAPSULE BY MOUTH TWICE DAILY FOR THREE WEEKS THEN ONE CAPSULE BEFORE EVENING MEAL (Patient not taking: Reported on 09/15/2016) 90 capsule 0   No current facility-administered medications on file prior to visit.     No Known Allergies  Family History  Problem Relation Age of Onset  . Diabetes Mother   . Hypertension Mother   . Cancer Sister     uterine cancer    Social History   Social History  . Marital status: Married    Spouse name: N/A  . Number of children: N/A  . Years of education: N/A    Occupational History  . Not on file.   Social History Main Topics  . Smoking status: Never Smoker  . Smokeless tobacco: Not on file  . Alcohol use Yes  . Drug use: No  . Sexual activity: Not on file   Other Topics Concern  . Not on file   Social History Narrative   Scientist, 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    One grandchild      He likes to play golf     Review of Systems  Constitutional: Negative.   HENT: Negative.   Eyes: Negative.   Respiratory: Negative.   Cardiovascular: Negative.   Genitourinary: Negative.   Musculoskeletal: Negative.   Skin: Negative.   Neurological: Negative.   Psychiatric/Behavioral: Positive for depression. The patient is nervous/anxious and has insomnia.   All other systems reviewed and are negative.   BP 140/80   Temp 98.1 F (36.7 C) (Oral)   Ht 5' 8.75" (1.746 m)   Wt 203 lb 9.6 oz (92.4 kg)   BMI 30.29 kg/m   Physical Exam  Constitutional: He is oriented to person, place, and time and well-developed, well-nourished, and in no distress. No distress.  HENT:  Head: Normocephalic and atraumatic.  Right Ear: External ear normal.  Left Ear: External ear normal.  Nose: Nose normal.  Mouth/Throat:  Oropharynx is clear and moist. No oropharyngeal exudate.  Eyes: Conjunctivae and EOM are normal. Pupils are equal, round, and reactive to light. Right eye exhibits no discharge. Left eye exhibits no discharge. No scleral icterus.  Neck: Normal range of motion. Neck supple.  Cardiovascular: Normal rate, regular rhythm, normal heart sounds and intact distal pulses.  Exam reveals no gallop and no friction rub.   No murmur heard. Pulmonary/Chest: Effort normal and breath sounds normal. No respiratory distress. He has no wheezes. He has no rales. He exhibits no tenderness.  Lymphadenopathy:    He has no cervical adenopathy.  Neurological: He is alert and oriented to person, place, and time. Gait normal. GCS score is 15.  Skin:  Skin is warm and dry. No rash noted. He is not diaphoretic. No erythema. No pallor.  Psychiatric: Mood, memory, affect and judgment normal.  Nursing note and vitals reviewed.  Assessment/Plan: 1. Encounter for medical examination to establish care - Follow up for CPE or sooner with any acute issues - Work on weight loss through diet and exercise  2. HYPERTRIGLYCERIDEMIA - Continue with Tricor - Needs to work on diet and exercise to lose weight   3. Anxiety and depression  - traZODone (DESYREL) 50 MG tablet; Take 0.5-1 tablets (25-50 mg total) by mouth at bedtime as needed for sleep.  Dispense: 30 tablet; Refill: 3 - clonazePAM (KLONOPIN) 0.5 MG tablet; Take 1 tablet (0.5 mg total) by mouth 2 (two) times daily.  Dispense: 60 tablet; Refill: 0  4. Insomnia, unspecified type  - traZODone (DESYREL) 50 MG tablet; Take 0.5-1 tablets (25-50 mg total) by mouth at bedtime as needed for sleep.  Dispense: 30 tablet; Refill: 3  Dorothyann Peng, NP

## 2016-09-25 ENCOUNTER — Encounter: Payer: Managed Care, Other (non HMO) | Admitting: Adult Health

## 2016-09-29 ENCOUNTER — Ambulatory Visit (INDEPENDENT_AMBULATORY_CARE_PROVIDER_SITE_OTHER): Payer: Managed Care, Other (non HMO) | Admitting: Adult Health

## 2016-09-29 VITALS — BP 132/80 | Temp 98.1°F | Ht 68.75 in | Wt 201.2 lb

## 2016-09-29 DIAGNOSIS — Z1159 Encounter for screening for other viral diseases: Secondary | ICD-10-CM | POA: Diagnosis not present

## 2016-09-29 DIAGNOSIS — J069 Acute upper respiratory infection, unspecified: Secondary | ICD-10-CM

## 2016-09-29 DIAGNOSIS — Z0000 Encounter for general adult medical examination without abnormal findings: Secondary | ICD-10-CM

## 2016-09-29 DIAGNOSIS — R7302 Impaired glucose tolerance (oral): Secondary | ICD-10-CM | POA: Diagnosis not present

## 2016-09-29 LAB — CBC WITH DIFFERENTIAL/PLATELET
BASOS PCT: 0.9 % (ref 0.0–3.0)
Basophils Absolute: 0 10*3/uL (ref 0.0–0.1)
EOS PCT: 4.3 % (ref 0.0–5.0)
Eosinophils Absolute: 0.2 10*3/uL (ref 0.0–0.7)
HEMATOCRIT: 43.2 % (ref 39.0–52.0)
HEMOGLOBIN: 15 g/dL (ref 13.0–17.0)
Lymphocytes Relative: 17.8 % (ref 12.0–46.0)
Lymphs Abs: 0.6 10*3/uL — ABNORMAL LOW (ref 0.7–4.0)
MCHC: 34.7 g/dL (ref 30.0–36.0)
MCV: 91.7 fl (ref 78.0–100.0)
MONOS PCT: 9.9 % (ref 3.0–12.0)
Monocytes Absolute: 0.4 10*3/uL (ref 0.1–1.0)
Neutro Abs: 2.4 10*3/uL (ref 1.4–7.7)
Neutrophils Relative %: 67.1 % (ref 43.0–77.0)
Platelets: 169 10*3/uL (ref 150.0–400.0)
RBC: 4.71 Mil/uL (ref 4.22–5.81)
RDW: 12.8 % (ref 11.5–15.5)
WBC: 3.6 10*3/uL — ABNORMAL LOW (ref 4.0–10.5)

## 2016-09-29 LAB — LIPID PANEL
CHOL/HDL RATIO: 5
Cholesterol: 224 mg/dL — ABNORMAL HIGH (ref 0–200)
HDL: 42.6 mg/dL (ref >39.00)
LDL Cholesterol: 154 mg/dL — ABNORMAL HIGH (ref 0–99)
NonHDL: 181.2
TRIGLYCERIDES: 138 mg/dL (ref 0.0–149.0)
VLDL: 27.6 mg/dL (ref 0.0–40.0)

## 2016-09-29 LAB — POC URINALSYSI DIPSTICK (AUTOMATED)
Bilirubin, UA: NEGATIVE
Blood, UA: NEGATIVE
Ketones, UA: NEGATIVE
Leukocytes, UA: NEGATIVE
NITRITE UA: NEGATIVE
Urobilinogen, UA: 0.2
pH, UA: 5

## 2016-09-29 LAB — BASIC METABOLIC PANEL
BUN: 18 mg/dL (ref 6–23)
CO2: 27 meq/L (ref 19–32)
Calcium: 8.6 mg/dL (ref 8.4–10.5)
Chloride: 106 mEq/L (ref 96–112)
Creatinine, Ser: 1.17 mg/dL (ref 0.40–1.50)
GFR: 67.39 mL/min (ref >60.00)
GLUCOSE: 162 mg/dL — AB (ref 70–99)
Potassium: 4.1 mEq/L (ref 3.5–5.1)
Sodium: 141 mEq/L (ref 135–145)

## 2016-09-29 LAB — HEPATIC FUNCTION PANEL
ALT: 31 U/L (ref 0–53)
AST: 23 U/L (ref 0–37)
Albumin: 4.1 g/dL (ref 3.5–5.2)
Alkaline Phosphatase: 60 U/L (ref 39–117)
Bilirubin, Direct: 0.1 mg/dL (ref 0.0–0.3)
TOTAL PROTEIN: 6.5 g/dL (ref 6.0–8.3)
Total Bilirubin: 0.6 mg/dL (ref 0.2–1.2)

## 2016-09-29 LAB — PSA: PSA: 0.56 ng/mL (ref 0.10–4.00)

## 2016-09-29 LAB — HEMOGLOBIN A1C: Hgb A1c MFr Bld: 7.6 % — ABNORMAL HIGH (ref 4.6–6.5)

## 2016-09-29 LAB — TSH: TSH: 1.9 u[IU]/mL (ref 0.35–4.50)

## 2016-09-29 MED ORDER — FLUTICASONE PROPIONATE 50 MCG/ACT NA SUSP: 2.0000 | Freq: Every day | NASAL | 6 refills | Status: DC

## 2016-09-29 MED ORDER — BENZONATATE 200 MG PO CAPS
200.0000 mg | ORAL_CAPSULE | Freq: Three times a day (TID) | ORAL | 0 refills | Status: DC | PRN
Start: 2016-09-29 — End: 2016-12-29

## 2016-09-29 NOTE — Progress Notes (Signed)
Subjective:    Patient ID: Matthew Patterson, male    DOB: 1956/07/10, 61 y.o.   MRN: [id number]  HPI   Patient presents for yearly preventative medicine examination. He is a pleasant 61 year old male who  has a past medical history of Anxiety and depression; GERD (gastroesophageal reflux disease); and Hyperlipidemia.   All immunizations and health maintenance protocols were reviewed with the patient and needed orders were placed.  Appropriate screening laboratory values were ordered for the patient including screening of hyperlipidemia, renal function and hepatic function. If indicated by BPH, a PSA was ordered.  Medication reconciliation,  past medical history, social history, problem list and allergies were reviewed in detail with the patient  Goals were established with regard to weight loss, exercise, and  diet in compliance with medications. He stays active but does not follow a specific diet.   Anxiety and Depression - he feels as though this is well controlled with current medication.- Controlled  Insomnia  - Is controlled with Trazodone as needed for sleep disturbance. Feels as though this works well for him - Controlled  Hyperlipidemia - Personal assistant and feels as though this works well for him   He is up to date on his colonoscopy, dental and vision care  His only acute complaint is that of a semi productive cough and maxillar sinus pain and pressure x 4 days. He denies any fevers or sick contacts. He is not feeling ill  Review of Systems  Constitutional: Negative.   HENT: Positive for congestion, rhinorrhea, sinus pain and sinus pressure.   Eyes: Negative.   Respiratory: Positive for cough. Negative for chest tightness, shortness of breath and wheezing.   Cardiovascular: Negative.   Endocrine: Negative.   Genitourinary: Negative.   Musculoskeletal: Negative.   Skin: Negative.   Allergic/Immunologic: Negative.   Neurological: Negative.   Hematological: Negative.     Psychiatric/Behavioral: Negative.   All other systems reviewed and are negative.  Past Medical History:  Diagnosis Date  . Anxiety and depression   . GERD (gastroesophageal reflux disease)   . Hyperlipidemia     Social History   Social History  . Marital status: Married    Spouse name: N/A  . Number of children: N/A  . Years of education: N/A   Occupational History  . Not on file.   Social History Main Topics  . Smoking status: Never Smoker  . Smokeless tobacco: Not on file  . Alcohol use Yes  . Drug use: No  . Sexual activity: Not on file   Other Topics Concern  . Not on file   Social History Narrative   Scientist, 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    One grandchild      He likes to play golf     Past Surgical History:  Procedure Laterality Date  . ROTATOR CUFF REPAIR    . SHOULDER SURGERY  2010   B/L    Family History  Problem Relation Age of Onset  . Diabetes Mother   . Hypertension Mother   . Cancer Sister     uterine cancer    No Known Allergies  Current Outpatient Prescriptions on File Prior to Visit  Medication Sig Dispense Refill  . citalopram (CELEXA) 20 MG tablet Take 1 tablet (20 mg total) by mouth daily. 30 tablet 0  . clonazePAM (KLONOPIN) 0.5 MG tablet Take 1 tablet (0.5 mg total) by mouth 2 (two) times daily. 60 tablet  0  . fenofibrate (TRICOR) 145 MG tablet TAKE ONE TABLET BY MOUTH DAILY 90 tablet 3  . omeprazole (PRILOSEC) 20 MG capsule TAKE ONE CAPSULE BY MOUTH TWICE DAILY FOR THREE WEEKS THEN ONE CAPSULE BEFORE EVENING MEAL 90 capsule 0  . traZODone (DESYREL) 50 MG tablet Take 0.5-1 tablets (25-50 mg total) by mouth at bedtime as needed for sleep. 30 tablet 3   No current facility-administered medications on file prior to visit.     BP 132/80   Temp 98.1 F (36.7 C) (Oral)   Ht 5' 8.75" (1.746 m)   Wt 201 lb 3.2 oz (91.3 kg)   BMI 29.93 kg/m       Objective:   Physical Exam  Constitutional: He is oriented  to person, place, and time. He appears well-developed and well-nourished. No distress.  Overweight    HENT:  Head: Normocephalic and atraumatic.  Right Ear: External ear normal.  Left Ear: External ear normal.  Nose: Nose normal.  Mouth/Throat: Oropharynx is clear and moist. No oropharyngeal exudate.  Eyes: Conjunctivae and EOM are normal. Pupils are equal, round, and reactive to light. Right eye exhibits no discharge. Left eye exhibits no discharge. No scleral icterus.  Neck: Trachea normal, normal range of motion and full passive range of motion without pain. Neck supple. No JVD present. Carotid bruit is not present. No tracheal deviation present. No thyroid mass and no thyromegaly present.  Cardiovascular: Normal rate, regular rhythm, normal heart sounds and intact distal pulses.  Exam reveals no gallop and no friction rub.   No murmur heard. Pulmonary/Chest: Effort normal and breath sounds normal. No stridor. No respiratory distress. He has no wheezes. He has no rales. He exhibits no tenderness.  Dry cough    Abdominal: Soft. Bowel sounds are normal. He exhibits no distension and no mass. There is no tenderness. There is no rebound and no guarding.  Genitourinary: Rectum normal and prostate normal. No penile tenderness.  Musculoskeletal: Normal range of motion. He exhibits no edema, tenderness or deformity.  Lymphadenopathy:    He has no cervical adenopathy.  Neurological: He is alert and oriented to person, place, and time. He has normal reflexes. He displays normal reflexes. No cranial nerve deficit. He exhibits normal muscle tone. Coordination normal.  Skin: Skin is warm and dry. No rash noted. He is not diaphoretic. No erythema. No pallor.  Psychiatric: He has a normal mood and affect. His behavior is normal. Judgment and thought content normal.  Nursing note and vitals reviewed.     Assessment & Plan:  1. Routine general medical examination at a health care facility  - Basic  metabolic panel - CBC with Differential/Platelet - Hepatic function panel - Lipid panel - POCT Urinalysis Dipstick (Automated) - PSA - TSH - Hep C Antibody - Hemoglobin A1c - Follow up in one year or sooner if needed - Work on weight loss through diet and exercise    2. Need for hepatitis C screening test  - Hep C Antibody  3. Impaired glucose tolerance  - Basic metabolic panel - CBC with Differential/Platelet - Hepatic function panel - Lipid panel - POCT Urinalysis Dipstick (Automated) - PSA - TSH - Hep C Antibody - Hemoglobin A1c  4. Upper respiratory tract infection, unspecified type  - benzonatate (TESSALON) 200 MG capsule; Take 1 capsule (200 mg total) by mouth 3 (three) times daily as needed for cough.  Dispense: 20 capsule; Refill: 0 - fluticasone (FLONASE) 50 MCG/ACT nasal spray; Place 2 sprays  into both nostrils daily.  Dispense: 16 g; Refill: 6 - Follow up if no improvement in the next 2-3 days   Dorothyann Peng, NP

## 2016-09-29 NOTE — Patient Instructions (Signed)
It was great seeing you today   I will follow up with you on your labs.   I have sent in a prescription for Tessalon pearls and flonase to help with the upper respiratory infection   Health Maintenance, Male A healthy lifestyle and preventative care can promote health and wellness.  Maintain regular health, dental, and eye exams.  Eat a healthy diet. Foods like vegetables, fruits, whole grains, low-fat dairy products, and lean protein foods contain the nutrients you need and are low in calories. Decrease your intake of foods high in solid fats, added sugars, and salt. Get information about a proper diet from your health care provider, if necessary.  Regular physical exercise is one of the most important things you can do for your health. Most adults should get at least 150 minutes of moderate-intensity exercise (any activity that increases your heart rate and causes you to sweat) each week. In addition, most adults need muscle-strengthening exercises on 2 or more days a week.   Maintain a healthy weight. The body mass index (BMI) is a screening tool to identify possible weight problems. It provides an estimate of body fat based on height and weight. Your health care provider can find your BMI and can help you achieve or maintain a healthy weight. For males 20 years and older:  A BMI below 18.5 is considered underweight.  A BMI of 18.5 to 24.9 is normal.  A BMI of 25 to 29.9 is considered overweight.  A BMI of 30 and above is considered obese.  Maintain normal blood lipids and cholesterol by exercising and minimizing your intake of saturated fat. Eat a balanced diet with plenty of fruits and vegetables. Blood tests for lipids and cholesterol should begin at age 36 and be repeated every 5 years. If your lipid or cholesterol levels are high, you are over age 59, or you are at high risk for heart disease, you may need your cholesterol levels checked more frequently.Ongoing high lipid and  cholesterol levels should be treated with medicines if diet and exercise are not working.  If you smoke, find out from your health care provider how to quit. If you do not use tobacco, do not start.  Lung cancer screening is recommended for adults aged 87-80 years who are at high risk for developing lung cancer because of a history of smoking. A yearly low-dose CT scan of the lungs is recommended for people who have at least a 30-pack-year history of smoking and are current smokers or have quit within the past 15 years. A pack year of smoking is smoking an average of 1 pack of cigarettes a day for 1 year (for example, a 30-pack-year history of smoking could mean smoking 1 pack a day for 30 years or 2 packs a day for 15 years). Yearly screening should continue until the smoker has stopped smoking for at least 15 years. Yearly screening should be stopped for people who develop a health problem that would prevent them from having lung cancer treatment.  If you choose to drink alcohol, do not have more than 2 drinks per day. One drink is considered to be 12 oz (360 mL) of beer, 5 oz (150 mL) of wine, or 1.5 oz (45 mL) of liquor.  Avoid the use of street drugs. Do not share needles with anyone. Ask for help if you need support or instructions about stopping the use of drugs.  High blood pressure causes heart disease and increases the risk of stroke.  High blood pressure is more likely to develop in:  People who have blood pressure in the end of the normal range (100-139/85-89 mm Hg).  People who are overweight or obese.  People who are African American.  If you are 62-80 years of age, have your blood pressure checked every 3-5 years. If you are 8 years of age or older, have your blood pressure checked every year. You should have your blood pressure measured twice--once when you are at a hospital or clinic, and once when you are not at a hospital or clinic. Record the average of the two measurements. To  check your blood pressure when you are not at a hospital or clinic, you can use:  An automated blood pressure machine at a pharmacy.  A home blood pressure monitor.  If you are 35-72 years old, ask your health care provider if you should take aspirin to prevent heart disease.  Diabetes screening involves taking a blood sample to check your fasting blood sugar level. This should be done once every 3 years after age 13 if you are at a normal weight and without risk factors for diabetes. Testing should be considered at a younger age or be carried out more frequently if you are overweight and have at least 1 risk factor for diabetes.  Colorectal cancer can be detected and often prevented. Most routine colorectal cancer screening begins at the age of 6 and continues through age 67. However, your health care provider may recommend screening at an earlier age if you have risk factors for colon cancer. On a yearly basis, your health care provider may provide home test kits to check for hidden blood in the stool. A small camera at the end of a tube may be used to directly examine the colon (sigmoidoscopy or colonoscopy) to detect the earliest forms of colorectal cancer. Talk to your health care provider about this at age 20 when routine screening begins. A direct exam of the colon should be repeated every 5-10 years through age 78, unless early forms of precancerous polyps or small growths are found.  People who are at an increased risk for hepatitis B should be screened for this virus. You are considered at high risk for hepatitis B if:  You were born in a country where hepatitis B occurs often. Talk with your health care provider about which countries are considered high risk.  Your parents were born in a high-risk country and you have not received a shot to protect against hepatitis B (hepatitis B vaccine).  You have HIV or AIDS.  You use needles to inject street drugs.  You live with, or have sex  with, someone who has hepatitis B.  You are a man who has sex with other men (MSM).  You get hemodialysis treatment.  You take certain medicines for conditions like cancer, organ transplantation, and autoimmune conditions.  Hepatitis C blood testing is recommended for all people born from [birth year] through 1965 and any individual with known risk factors for hepatitis C.  Healthy men should no longer receive prostate-specific antigen (PSA) blood tests as part of routine cancer screening. Talk to your health care provider about prostate cancer screening.  Testicular cancer screening is not recommended for adolescents or adult males who have no symptoms. Screening includes self-exam, a health care provider exam, and other screening tests. Consult with your health care provider about any symptoms you have or any concerns you have about testicular cancer.  Practice safe sex. Use condoms  and avoid high-risk sexual practices to reduce the spread of sexually transmitted infections (STIs).  You should be screened for STIs, including gonorrhea and chlamydia if:  You are sexually active and are younger than 24 years.  You are older than 24 years, and your health care provider tells you that you are at risk for this type of infection.  Your sexual activity has changed since you were last screened, and you are at an increased risk for chlamydia or gonorrhea. Ask your health care provider if you are at risk.  If you are at risk of being infected with HIV, it is recommended that you take a prescription medicine daily to prevent HIV infection. This is called pre-exposure prophylaxis (PrEP). You are considered at risk if:  You are a man who has sex with other men (MSM).  You are a heterosexual man who is sexually active with multiple partners.  You take drugs by injection.  You are sexually active with a partner who has HIV.  Talk with your health care provider about whether you are at high risk of being  infected with HIV. If you choose to begin PrEP, you should first be tested for HIV. You should then be tested every 3 months for as long as you are taking PrEP.  Use sunscreen. Apply sunscreen liberally and repeatedly throughout the day. You should seek shade when your shadow is shorter than you. Protect yourself by wearing long sleeves, pants, a wide-brimmed hat, and sunglasses year round whenever you are outdoors.  Tell your health care provider of new moles or changes in moles, especially if there is a change in shape or color. Also, tell your health care provider if a mole is larger than the size of a pencil eraser.  A one-time screening for abdominal aortic aneurysm (AAA) and surgical repair of large AAAs by ultrasound is recommended for men aged 84-75 years who are current or former smokers.  Stay current with your vaccines (immunizations).   This information is not intended to replace advice given to you by your health care provider. Make sure you discuss any questions you have with your health care provider.   Document Released: 01/16/2008 Document Revised: 08/10/2014 Document Reviewed: 12/15/2010 Elsevier Interactive Patient Education [employer].

## 2016-09-30 ENCOUNTER — Telehealth: Payer: Self-pay | Admitting: Adult Health

## 2016-09-30 ENCOUNTER — Encounter: Payer: Self-pay | Admitting: Adult Health

## 2016-09-30 LAB — HEPATITIS C ANTIBODY: HCV AB: NEGATIVE

## 2016-09-30 MED ORDER — METFORMIN HCL 500 MG PO TABS: 500.0000 mg | ORAL_TABLET | Freq: Two times a day (BID) | ORAL | 3 refills | Status: DC

## 2016-09-30 NOTE — Telephone Encounter (Signed)
Spoke to [place] and informed him on hs labs. His triglycerides have reduced significantly.   Unfortunately, his A1c increased and he is now considered diabetic.   I am going to start him on Metformin and bring him back in 3 months for recheck.   Will get him a glucometer when he comes in .   Advised on diet and exercise.   Will consider diabetes education in the future

## 2016-10-08 ENCOUNTER — Other Ambulatory Visit: Payer: Self-pay | Admitting: Family Medicine

## 2016-12-29 ENCOUNTER — Other Ambulatory Visit: Payer: Self-pay

## 2016-12-29 ENCOUNTER — Ambulatory Visit (INDEPENDENT_AMBULATORY_CARE_PROVIDER_SITE_OTHER): Payer: Managed Care, Other (non HMO) | Admitting: Adult Health

## 2016-12-29 VITALS — BP 142/70 | Temp 98.1°F | Ht 68.75 in | Wt 198.4 lb

## 2016-12-29 DIAGNOSIS — E119 Type 2 diabetes mellitus without complications: Secondary | ICD-10-CM | POA: Diagnosis not present

## 2016-12-29 LAB — POCT GLYCOSYLATED HEMOGLOBIN (HGB A1C): Hemoglobin A1C: 6.5

## 2016-12-29 MED ORDER — ONETOUCH VERIO IQ SYSTEM W/DEVICE KIT: PACK | 0 refills | Status: DC

## 2016-12-29 MED ORDER — GLUCOSE BLOOD VI STRP: ORAL_STRIP | 12 refills | Status: AC

## 2016-12-29 MED ORDER — ONETOUCH ULTRASOFT LANCETS MISC: 12 refills | Status: AC

## 2016-12-29 NOTE — Progress Notes (Signed)
 Subjective:    Patient ID: Matthew Patterson, male    DOB: 08/20/1955, 61 y.o.   MRN: [id number]  HPI 61 year old male who  has a past medical history of Anxiety and depression; Diabetes mellitus without complication (HCC); GERD (gastroesophageal reflux disease); and Hyperlipidemia. He presents to the office today for three month follow up regarding diabetes. During his last visit, it was discovered during this physical that he was diabetic ( A1c 7.6). He was started on Metformin 500mg BID and asked to make life style changes.   Today in the office he reports that he has had no side effects of metformin and he has made simple dietary modifications such as eating less carbs and sweets and eating more fish   Lab Results  Component Value Date   HGBA1C 7.6 (H) 09/29/2016   Wt Readings from Last 3 Encounters:  12/29/16 198 lb 6.4 oz (90 kg)  09/29/16 201 lb 3.2 oz (91.3 kg)  09/15/16 203 lb 9.6 oz (92.4 kg)    Review of Systems  Constitutional: Negative.   Respiratory: Negative.   Cardiovascular: Negative.   Gastrointestinal: Negative.   Genitourinary: Negative.   Neurological: Negative.   All other systems reviewed and are negative.  Past Medical History:  Diagnosis Date  . Anxiety and depression   . Diabetes mellitus without complication (HCC)   . GERD (gastroesophageal reflux disease)   . Hyperlipidemia     Social History   Social History  . Marital status: Married    Spouse name: N/A  . Number of children: N/A  . Years of education: N/A   Occupational History  . Not on file.   Social History Main Topics  . Smoking status: Never Smoker  . Smokeless tobacco: Not on file  . Alcohol use Yes  . Drug use: No  . Sexual activity: Not on file   Other Topics Concern  . Not on file   Social History Narrative   Senior buyer for manufacturing    Married   One children    One grandchild      He likes to play golf     Past Surgical History:  Procedure Laterality  Date  . ROTATOR CUFF REPAIR    . SHOULDER SURGERY  2010   B/L    Family History  Problem Relation Age of Onset  . Diabetes Mother   . Hypertension Mother   . Cancer Sister        uterine cancer    No Known Allergies  Current Outpatient Prescriptions on File Prior to Visit  Medication Sig Dispense Refill  . benzonatate (TESSALON) 200 MG capsule Take 1 capsule (200 mg total) by mouth 3 (three) times daily as needed for cough. 20 capsule 0  . citalopram (CELEXA) 20 MG tablet Take 1 tablet (20 mg total) by mouth daily. 30 tablet 0  . clonazePAM (KLONOPIN) 0.5 MG tablet Take 1 tablet (0.5 mg total) by mouth 2 (two) times daily. 60 tablet 0  . fenofibrate (TRICOR) 145 MG tablet TAKE ONE TABLET BY MOUTH DAILY 90 tablet 3  . fluticasone (FLONASE) 50 MCG/ACT nasal spray Place 2 sprays into both nostrils daily. 16 g 6  . metFORMIN (GLUCOPHAGE) 500 MG tablet Take 1 tablet (500 mg total) by mouth 2 (two) times daily with a meal. 180 tablet 3  . omeprazole (PRILOSEC) 20 MG capsule TAKE ONE CAPSULE BY MOUTH TWICE DAILY FOR THREE WEEKS THEN ONE CAPSULE BEFORE EVENING MEAL 90 capsule 0  .   traZODone (DESYREL) 50 MG tablet Take 0.5-1 tablets (25-50 mg total) by mouth at bedtime as needed for sleep. 30 tablet 3   No current facility-administered medications on file prior to visit.     BP (!) 142/70 (BP Location: Left Arm, Patient Position: Sitting, Cuff Size: Normal)   Temp 98.1 F (36.7 C) (Oral)   Ht 5' 8.75" (1.746 m)   Wt 198 lb 6.4 oz (90 kg)   BMI 29.51 kg/m       Objective:   Physical Exam  Constitutional: He is oriented to person, place, and time. He appears well-developed and well-nourished. No distress.  Cardiovascular: Normal rate, regular rhythm, normal heart sounds and intact distal pulses.  Exam reveals no gallop and no friction rub.   No murmur heard. Pulmonary/Chest: Effort normal and breath sounds normal. No respiratory distress. He has no wheezes. He has no rales. He  exhibits no tenderness.  Abdominal: Soft. Bowel sounds are normal. He exhibits no distension and no mass. There is no tenderness. There is no rebound and no guarding.  Neurological: He is alert and oriented to person, place, and time.  Skin: Skin is warm and dry. No rash noted. He is not diaphoretic. No erythema. No pallor.  Psychiatric: He has a normal mood and affect. His behavior is normal. Judgment and thought content normal.  Nursing note and vitals reviewed.     Assessment & Plan:   1. Controlled type 2 diabetes mellitus without complication, without long-term current use of insulin (HCC)  - POC HgB A1c - 6.5 - has improved from 7.6  - Blood Glucose Monitoring Suppl (ONETOUCH VERIO IQ SYSTEM) w/Device KIT; 1 kit  Dispense: 1 kit; Refill: 0 - glucose blood test strip; Use as instructed  Dispense: 100 each; Refill: 12 - Lancets (ONETOUCH ULTRASOFT) lancets; Use as instructed  Dispense: 100 each; Refill: 12 - He denies referral to diabetes education  - Follow up in 3 months  - Continue to work on diabetic diet and exercise    , NP  -   

## 2017-04-16 ENCOUNTER — Ambulatory Visit (INDEPENDENT_AMBULATORY_CARE_PROVIDER_SITE_OTHER): Payer: Managed Care, Other (non HMO) | Admitting: Adult Health

## 2017-04-16 ENCOUNTER — Encounter: Payer: Self-pay | Admitting: Adult Health

## 2017-04-16 DIAGNOSIS — E118 Type 2 diabetes mellitus with unspecified complications: Secondary | ICD-10-CM | POA: Insufficient documentation

## 2017-04-16 DIAGNOSIS — E119 Type 2 diabetes mellitus without complications: Secondary | ICD-10-CM

## 2017-04-16 LAB — POCT GLYCOSYLATED HEMOGLOBIN (HGB A1C): HEMOGLOBIN A1C: 6

## 2017-04-16 MED ORDER — LISINOPRIL 2.5 MG PO TABS: 2.5000 mg | ORAL_TABLET | Freq: Every day | ORAL | 3 refills | Status: DC

## 2017-04-16 NOTE — Progress Notes (Signed)
Subjective:    Patient ID: Matthew Patterson, male    DOB: [birth year], 61 y.o.   MRN: [id number]  HPI  61 year old male who  has a past medical history of Anxiety and depression; Diabetes mellitus without complication ([place]); GERD (gastroesophageal reflux disease); and Hyperlipidemia. He presents to the office today for for three month follow up regarding diabetes. He is currently maintained on Metformin 500 mg BID. His last A1c was 6.5, this was down from 7.6 ( at time of initial diagnosis.). During his last visit he reported that he was in the process of making dietary changes such as not eating carbs or drinking soda.   Today in the office he reports that his blood sugars have been in the 120's. He has been continuing to work on his diet and is exercising more. He has been able to lose 7 pounds over the last three months   Wt Readings from Last 3 Encounters:  04/16/17 191 lb (86.6 kg)  12/29/16 198 lb 6.4 oz (90 kg)  09/29/16 201 lb 3.2 oz (91.3 kg)   BP Readings from Last 3 Encounters:  04/16/17 130/80  12/29/16 (!) 142/70  09/29/16 132/80    Review of Systems See HPI   Past Medical History:  Diagnosis Date  . Anxiety and depression   . Diabetes mellitus without complication ([place])   . GERD (gastroesophageal reflux disease)   . Hyperlipidemia     Social History   Social History  . Marital status: Married    Spouse name: N/A  . Number of children: N/A  . Years of education: N/A   Occupational History  . Not on file.   Social History Main Topics  . Smoking status: Never Smoker  . Smokeless tobacco: Never Used  . Alcohol use Yes  . Drug use: No  . Sexual activity: Not on file   Other Topics Concern  . Not on file   Social History Narrative   Scientist, 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    One grandchild      He likes to play golf     Past Surgical History:  Procedure Laterality Date  . ROTATOR CUFF REPAIR    . SHOULDER SURGERY  2010   B/L      Family History  Problem Relation Age of Onset  . Diabetes Mother   . Hypertension Mother   . Cancer Sister        uterine cancer    No Known Allergies  Current Outpatient Prescriptions on File Prior to Visit  Medication Sig Dispense Refill  . Blood Glucose Monitoring Suppl (ONETOUCH VERIO IQ SYSTEM) w/Device KIT 1 kit 1 kit 0  . citalopram (CELEXA) 20 MG tablet Take 1 tablet (20 mg total) by mouth daily. 30 tablet 0  . clonazePAM (KLONOPIN) 0.5 MG tablet Take 1 tablet (0.5 mg total) by mouth 2 (two) times daily. 60 tablet 0  . fenofibrate (TRICOR) 145 MG tablet TAKE ONE TABLET BY MOUTH DAILY 90 tablet 3  . glucose blood test strip Use as instructed 100 each 12  . Lancets (ONETOUCH ULTRASOFT) lancets Use as instructed 100 each 12  . metFORMIN (GLUCOPHAGE) 500 MG tablet Take 1 tablet (500 mg total) by mouth 2 (two) times daily with a meal. 180 tablet 3  . omeprazole (PRILOSEC) 20 MG capsule TAKE ONE CAPSULE BY MOUTH TWICE DAILY FOR THREE WEEKS THEN ONE CAPSULE BEFORE EVENING MEAL 90 capsule 0  .  traZODone (DESYREL) 50 MG tablet Take 0.5-1 tablets (25-50 mg total) by mouth at bedtime as needed for sleep. 30 tablet 3   No current facility-administered medications on file prior to visit.     BP 130/80 (BP Location: Left Arm, Patient Position: Sitting, Cuff Size: Large)   Pulse (!) 58   Temp 98 F (36.7 C) (Oral)   Wt 191 lb (86.6 kg)   SpO2 97%   BMI 28.41 kg/m       Objective:   Physical Exam  Constitutional: He is oriented to person, place, and time. He appears well-developed and well-nourished. No distress.  Cardiovascular: Normal rate, regular rhythm, normal heart sounds and intact distal pulses.  Exam reveals no gallop and no friction rub.   No murmur heard. Pulmonary/Chest: Effort normal and breath sounds normal. No respiratory distress. He has no wheezes. He has no rales. He exhibits no tenderness.  Neurological: He is alert and oriented to person, place, and time.   Skin: Skin is warm and dry. No rash noted. He is not diaphoretic. No erythema. No pallor.  Psychiatric: He has a normal mood and affect. His behavior is normal. Judgment and thought content normal.  Nursing note and vitals reviewed.     Assessment & Plan:  1. Type 2 diabetes mellitus without complication, without long-term current use of insulin (HCC) - POC HgB A1c- 6.0. This has improved from 6.5.  - lisinopril (ZESTRIL) 2.5 MG tablet; Take 1 tablet (2.5 mg total) by mouth daily.  Dispense: 90 tablet; Refill: 3 - keep up the good work!  - Will add lisinopril 2.5 mg daily for kidney protection  - Hopefully be able to decreased Metformin to 250 mg BID next visit  - Follow up in 3 months   Dorothyann Peng, NP

## 2017-07-16 ENCOUNTER — Ambulatory Visit (INDEPENDENT_AMBULATORY_CARE_PROVIDER_SITE_OTHER): Payer: Managed Care, Other (non HMO) | Admitting: Adult Health

## 2017-07-16 ENCOUNTER — Encounter: Payer: Self-pay | Admitting: Adult Health

## 2017-07-16 VITALS — BP 140/90 | Temp 98.4°F | Wt 197.0 lb

## 2017-07-16 DIAGNOSIS — E119 Type 2 diabetes mellitus without complications: Secondary | ICD-10-CM | POA: Diagnosis not present

## 2017-07-16 LAB — POCT GLYCOSYLATED HEMOGLOBIN (HGB A1C): HEMOGLOBIN A1C: 6

## 2017-07-16 NOTE — Progress Notes (Signed)
Subjective:    Patient ID: Matthew Patterson, male    DOB: 06-13-1956, 61 y.o.   MRN: [id number]  HPI   61 year old male who  has a past medical history of Anxiety and depression, Diabetes mellitus without complication ([place]), GERD (gastroesophageal reflux disease), and Hyperlipidemia.   He presents to the office today for 13-monthfollow-up regarding diabetes.  He is currently maintained on metformin 500 mg twice daily.  His last A1c in September 2018 was 6.0   Today in the office he reports that his blood sugars have been in the 120's. He has been continuing to work on his diet and I still trying to get his exercise routine down.   He denies any complaints such as chest pain, shortness of breath, polydipsia, polyuria nausea vomiting or diarrhea.    Wt Readings from Last 3 Encounters:  07/16/17 197 lb (89.4 kg)  04/16/17 191 lb (86.6 kg)  12/29/16 198 lb 6.4 oz (90 kg)    Review of Systems See HPI   Past Medical History:  Diagnosis Date  . Anxiety and depression   . Diabetes mellitus without complication ([place]   . GERD (gastroesophageal reflux disease)   . Hyperlipidemia     Social History   Socioeconomic History  . Marital status: Married    Spouse name: Not on file  . Number of children: Not on file  . Years of education: Not on file  . Highest education level: Not on file  Social Needs  . Financial resource strain: Not on file  . Food insecurity - worry: Not on file  . Food insecurity - inability: Not on file  . Transportation needs - medical: Not on file  . Transportation needs - non-medical: Not on file  Occupational History  . Not on file  Tobacco Use  . Smoking status: Never Smoker  . Smokeless tobacco: Never Used  Substance and Sexual Activity  . Alcohol use: Yes  . Drug use: No  . Sexual activity: Not on file  Other Topics Concern  . Not on file  Social History Narrative   SScientist, research (life sciences)   Married   One children    One grandchild        He likes to play golf     Past Surgical History:  Procedure Laterality Date  . ROTATOR CUFF REPAIR    . SHOULDER SURGERY  2010   B/L    Family History  Problem Relation Age of Onset  . Diabetes Mother   . Hypertension Mother   . Cancer Sister        uterine cancer    No Known Allergies  Current Outpatient Medications on File Prior to Visit  Medication Sig Dispense Refill  . Blood Glucose Monitoring Suppl (ONETOUCH VERIO IQ SYSTEM) w/Device KIT 1 kit 1 kit 0  . citalopram (CELEXA) 20 MG tablet Take 1 tablet (20 mg total) by mouth daily. 30 tablet 0  . clonazePAM (KLONOPIN) 0.5 MG tablet Take 1 tablet (0.5 mg total) by mouth 2 (two) times daily. 60 tablet 0  . fenofibrate (TRICOR) 145 MG tablet TAKE ONE TABLET BY MOUTH DAILY 90 tablet 3  . glucose blood test strip Use as instructed 100 each 12  . Lancets (ONETOUCH ULTRASOFT) lancets Use as instructed 100 each 12  . lisinopril (ZESTRIL) 2.5 MG tablet Take 1 tablet (2.5 mg total) by mouth daily. 90 tablet 3  . metFORMIN (GLUCOPHAGE) 500 MG tablet Take 1 tablet (  500 mg total) by mouth 2 (two) times daily with a meal. 180 tablet 3  . omeprazole (PRILOSEC) 20 MG capsule TAKE ONE CAPSULE BY MOUTH TWICE DAILY FOR THREE WEEKS THEN ONE CAPSULE BEFORE EVENING MEAL 90 capsule 0  . traZODone (DESYREL) 50 MG tablet Take 0.5-1 tablets (25-50 mg total) by mouth at bedtime as needed for sleep. 30 tablet 3   No current facility-administered medications on file prior to visit.     BP 140/90 (BP Location: Left Arm)   Temp 98.4 F (36.9 C) (Oral)   Wt 197 lb (89.4 kg)   BMI 29.30 kg/m       Objective:   Physical Exam  Constitutional: He is oriented to person, place, and time. He appears well-developed and well-nourished. No distress.  HENT:  Head: Normocephalic and atraumatic.  Right Ear: External ear normal.  Left Ear: External ear normal.  Nose: Nose normal.  Mouth/Throat: Oropharynx is clear and moist. No oropharyngeal  exudate.  Neck: Normal range of motion. Neck supple. No JVD present. No tracheal deviation present. No thyromegaly present.  Cardiovascular: Normal rate, regular rhythm, normal heart sounds and intact distal pulses. Exam reveals no gallop and no friction rub.  No murmur heard. Pulmonary/Chest: Effort normal and breath sounds normal. No stridor. No respiratory distress. He has no wheezes. He has no rales. He exhibits no tenderness.  Musculoskeletal: Normal range of motion.  Lymphadenopathy:    He has no cervical adenopathy.  Neurological: He is alert and oriented to person, place, and time. He has normal reflexes. He displays normal reflexes. No cranial nerve deficit. He exhibits normal muscle tone. Coordination normal.  Skin: Skin is warm and dry. No rash noted. He is not diaphoretic. No erythema. No pallor.  Psychiatric: He has a normal mood and affect. His behavior is normal. Judgment and thought content normal.  Nursing note and vitals reviewed.     Assessment & Plan:  1. Type 2 diabetes mellitus without complication, without long-term current use of insulin (HCC) - Weight has increased about 6 pounds.  - POCT A1C - 6.0 - stayed the same  - Will follow up at his CPE in February - No change in medication at this time  - Work on [employer] loss   Dorothyann Peng, NP

## 2017-07-22 ENCOUNTER — Other Ambulatory Visit: Payer: Self-pay | Admitting: Adult Health

## 2017-07-22 NOTE — Telephone Encounter (Signed)
LAST FILLED ON 09/30/2016 FOR 1 YEAR.  REFILL REQUEST IS EARLY. DENIED.

## 2017-09-22 ENCOUNTER — Other Ambulatory Visit: Payer: Self-pay | Admitting: Adult Health

## 2017-09-22 NOTE — Telephone Encounter (Signed)
Sent to the pharmacy by e-scribe for 90 days.  Pt has upcoming cpx on 09/30/17

## 2017-09-30 ENCOUNTER — Ambulatory Visit (INDEPENDENT_AMBULATORY_CARE_PROVIDER_SITE_OTHER): Payer: 59 | Admitting: Adult Health

## 2017-09-30 ENCOUNTER — Encounter: Payer: Self-pay | Admitting: Adult Health

## 2017-09-30 VITALS — BP 134/90 | Temp 97.9°F | Ht 68.5 in | Wt 199.0 lb

## 2017-09-30 DIAGNOSIS — Z125 Encounter for screening for malignant neoplasm of prostate: Secondary | ICD-10-CM

## 2017-09-30 DIAGNOSIS — E119 Type 2 diabetes mellitus without complications: Secondary | ICD-10-CM | POA: Diagnosis not present

## 2017-09-30 DIAGNOSIS — F329 Major depressive disorder, single episode, unspecified: Secondary | ICD-10-CM | POA: Diagnosis not present

## 2017-09-30 DIAGNOSIS — F419 Anxiety disorder, unspecified: Secondary | ICD-10-CM | POA: Diagnosis not present

## 2017-09-30 DIAGNOSIS — Z0000 Encounter for general adult medical examination without abnormal findings: Secondary | ICD-10-CM

## 2017-09-30 DIAGNOSIS — F32A Depression, unspecified: Secondary | ICD-10-CM

## 2017-09-30 DIAGNOSIS — G4700 Insomnia, unspecified: Secondary | ICD-10-CM

## 2017-09-30 DIAGNOSIS — E781 Pure hyperglyceridemia: Secondary | ICD-10-CM | POA: Diagnosis not present

## 2017-09-30 DIAGNOSIS — Z23 Encounter for immunization: Secondary | ICD-10-CM

## 2017-09-30 DIAGNOSIS — Z114 Encounter for screening for human immunodeficiency virus [HIV]: Secondary | ICD-10-CM

## 2017-09-30 LAB — CBC WITH DIFFERENTIAL/PLATELET
BASOS ABS: 0 10*3/uL (ref 0.0–0.1)
Basophils Relative: 0.8 % (ref 0.0–3.0)
EOS ABS: 0.1 10*3/uL (ref 0.0–0.7)
Eosinophils Relative: 2.8 % (ref 0.0–5.0)
HEMATOCRIT: 42.7 % (ref 39.0–52.0)
HEMOGLOBIN: 14.9 g/dL (ref 13.0–17.0)
LYMPHS PCT: 17.3 % (ref 12.0–46.0)
Lymphs Abs: 0.9 10*3/uL (ref 0.7–4.0)
MCHC: 34.9 g/dL (ref 30.0–36.0)
MCV: 92.4 fl (ref 78.0–100.0)
MONOS PCT: 8.2 % (ref 3.0–12.0)
Monocytes Absolute: 0.4 10*3/uL (ref 0.1–1.0)
Neutro Abs: 3.6 10*3/uL (ref 1.4–7.7)
Neutrophils Relative %: 70.9 % (ref 43.0–77.0)
Platelets: 206 10*3/uL (ref 150.0–400.0)
RBC: 4.63 Mil/uL (ref 4.22–5.81)
RDW: 12.7 % (ref 11.5–15.5)
WBC: 5 10*3/uL (ref 4.0–10.5)

## 2017-09-30 LAB — COMPREHENSIVE METABOLIC PANEL
ALBUMIN: 4 g/dL (ref 3.5–5.2)
ALT: 26 U/L (ref 0–53)
AST: 16 U/L (ref 0–37)
Alkaline Phosphatase: 39 U/L (ref 39–117)
BUN: 19 mg/dL (ref 6–23)
CALCIUM: 9.2 mg/dL (ref 8.4–10.5)
CHLORIDE: 102 meq/L (ref 96–112)
CO2: 27 mEq/L (ref 19–32)
CREATININE: 1.17 mg/dL (ref 0.40–1.50)
GFR: 67.17 mL/min (ref >60.00)
Glucose, Bld: 156 mg/dL — ABNORMAL HIGH (ref 70–99)
POTASSIUM: 4.3 meq/L (ref 3.5–5.1)
Sodium: 138 mEq/L (ref 135–145)
Total Bilirubin: 0.7 mg/dL (ref 0.2–1.2)
Total Protein: 6.8 g/dL (ref 6.0–8.3)

## 2017-09-30 LAB — HEMOGLOBIN A1C: Hgb A1c MFr Bld: 7.2 % — ABNORMAL HIGH (ref 4.6–6.5)

## 2017-09-30 LAB — TSH: TSH: 1.74 u[IU]/mL (ref 0.35–4.50)

## 2017-09-30 LAB — PSA: PSA: 0.65 ng/mL (ref 0.10–4.00)

## 2017-09-30 LAB — LIPID PANEL
CHOL/HDL RATIO: 6
Cholesterol: 258 mg/dL — ABNORMAL HIGH (ref 0–200)
HDL: 44.3 mg/dL (ref >39.00)
LDL CALC: 181 mg/dL — AB (ref 0–99)
NonHDL: 213.37
Triglycerides: 162 mg/dL — ABNORMAL HIGH (ref 0.0–149.0)
VLDL: 32.4 mg/dL (ref 0.0–40.0)

## 2017-09-30 NOTE — Progress Notes (Signed)
Subjective:    Patient ID: Matthew Patterson, male    DOB: [birth year], 62 y.o.   MRN: [id number]  HPI  Patient presents for yearly preventative medicine examination. He is a pleasant 62 year old male who  has a past medical history of Anxiety and depression, Diabetes mellitus without complication ([place]), GERD (gastroesophageal reflux disease), and Hyperlipidemia.  Anxiety and Depression - he feels as though this is well controlled with klonopin and Celexa   Insomnia  - Is controlled with Trazodone PRN for sleep disturbance- stable   Hyperlipidemia - Takes Tricor and feels as though this works well for him  Lab Results  Component Value Date   CHOL 224 (H) 09/29/2016   HDL 42.60 09/29/2016   LDLCALC 154 (H) 09/29/2016   LDLDIRECT 97.0 02/18/2015   TRIG 138.0 09/29/2016   CHOLHDL 5 09/29/2016    Diabetes mellitus-trolled with metformin 500 mg twice a day.  Takes lisinopril 2-1/2 mg for kidney protection Lab Results  Component Value Date   HGBA1C 6.0 07/16/2017    All immunizations and health maintenance protocols were reviewed with the patient and needed orders were placed.  Appropriate screening laboratory values were ordered for the patient including screening of hyperlipidemia, renal function and hepatic function. If indicated by BPH, a PSA was ordered.  Medication reconciliation,  past medical history, social history, problem list and allergies were reviewed in detail with the patient  Goals were established with regard to weight loss, exercise, and  diet in compliance with medications  Wt Readings from Last 3 Encounters:  09/30/17 199 lb (90.3 kg)  07/16/17 197 lb (89.4 kg)  04/16/17 191 lb (86.6 kg)    End of life planning was discussed.  He is up-to-date on his colonoscopy and he participates in routine dental exams.  He denies any interval history and has no acute complaints today  Review of Systems  Constitutional: Negative.   HENT: Negative.   Eyes:  Negative.   Respiratory: Negative.   Cardiovascular: Negative.   Gastrointestinal: Negative.   Endocrine: Negative.   Genitourinary: Negative.   Musculoskeletal: Negative.   Skin: Negative.   Allergic/Immunologic: Negative.   Neurological: Negative.   Hematological: Negative.   Psychiatric/Behavioral: Negative.   All other systems reviewed and are negative.  Past Medical History:  Diagnosis Date  . Anxiety and depression   . Diabetes mellitus without complication ([place])   . GERD (gastroesophageal reflux disease)   . Hyperlipidemia     Social History   Socioeconomic History  . Marital status: Married    Spouse name: Not on file  . Number of children: Not on file  . Years of education: Not on file  . Highest education level: Not on file  Social Needs  . Financial resource strain: Not on file  . Food insecurity - worry: Not on file  . Food insecurity - inability: Not on file  . Transportation needs - medical: Not on file  . Transportation needs - non-medical: Not on file  Occupational History  . Not on file  Tobacco Use  . Smoking status: Never Smoker  . Smokeless tobacco: Never Used  Substance and Sexual Activity  . Alcohol use: Yes  . Drug use: No  . Sexual activity: Not on file  Other Topics Concern  . Not on file  Social History Narrative   Scientist, 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    One grandchild      He likes to  play golf     Past Surgical History:  Procedure Laterality Date  . ROTATOR CUFF REPAIR    . SHOULDER SURGERY  2010   B/L    Family History  Problem Relation Age of Onset  . Diabetes Mother   . Hypertension Mother   . Cancer Sister        uterine cancer    No Known Allergies  Current Outpatient Medications on File Prior to Visit  Medication Sig Dispense Refill  . Blood Glucose Monitoring Suppl (ONETOUCH VERIO IQ SYSTEM) w/Device KIT 1 kit 1 kit 0  . citalopram (CELEXA) 20 MG tablet Take 1 tablet (20 mg total) by  mouth daily. 30 tablet 0  . clonazePAM (KLONOPIN) 0.5 MG tablet Take 1 tablet (0.5 mg total) by mouth 2 (two) times daily. 60 tablet 0  . fenofibrate (TRICOR) 145 MG tablet TAKE ONE TABLET BY MOUTH DAILY 90 tablet 0  . glucose blood test strip Use as instructed 100 each 12  . Lancets (ONETOUCH ULTRASOFT) lancets Use as instructed 100 each 12  . lisinopril (ZESTRIL) 2.5 MG tablet Take 1 tablet (2.5 mg total) by mouth daily. 90 tablet 3  . metFORMIN (GLUCOPHAGE) 500 MG tablet Take 1 tablet (500 mg total) by mouth 2 (two) times daily with a meal. 180 tablet 3  . omeprazole (PRILOSEC) 20 MG capsule TAKE ONE CAPSULE BY MOUTH TWICE DAILY FOR THREE WEEKS THEN ONE CAPSULE BEFORE EVENING MEAL 90 capsule 0  . traZODone (DESYREL) 50 MG tablet Take 0.5-1 tablets (25-50 mg total) by mouth at bedtime as needed for sleep. 30 tablet 3   No current facility-administered medications on file prior to visit.     There were no vitals taken for this visit.      Objective:   Physical Exam  Constitutional: He is oriented to person, place, and time. He appears well-developed and well-nourished. No distress.  Obese    HENT:  Head: Normocephalic and atraumatic.  Right Ear: External ear normal.  Left Ear: External ear normal.  Nose: Nose normal.  Mouth/Throat: Oropharynx is clear and moist. No oropharyngeal exudate.  Eyes: Conjunctivae and EOM are normal. Pupils are equal, round, and reactive to light. Right eye exhibits no discharge. Left eye exhibits no discharge. No scleral icterus.  Neck: Normal range of motion. Neck supple. No JVD present. Carotid bruit is not present. No tracheal deviation present. No thyromegaly present.  Cardiovascular: Normal rate, regular rhythm, normal heart sounds and intact distal pulses. Exam reveals no gallop and no friction rub.  No murmur heard. Pulmonary/Chest: Effort normal and breath sounds normal. No stridor. No respiratory distress. He has no wheezes. He has no rales. He  exhibits no tenderness.  Abdominal: Soft. Bowel sounds are normal. He exhibits no distension and no mass. There is no tenderness. There is no rebound and no guarding.  Genitourinary:  Genitourinary Comments: Will do PSA   Musculoskeletal: Normal range of motion. He exhibits no edema, tenderness or deformity.  Lymphadenopathy:    He has no cervical adenopathy.  Neurological: He is alert and oriented to person, place, and time. He has normal reflexes. He displays normal reflexes. No cranial nerve deficit. He exhibits normal muscle tone. Coordination normal.  Skin: Skin is warm and dry. No rash noted. He is not diaphoretic. No erythema. No pallor.  Psychiatric: He has a normal mood and affect. His behavior is normal. Judgment and thought content normal.  Nursing note and vitals reviewed.     Assessment &  Plan:  1. Routine general medical examination at a health care facility - Needs to work on diet and exercise to help him lose weight  - CBC with Differential/Platelet - Lipid panel - TSH - Hemoglobin A1c - CMP  2. Anxiety and depression - Continue with current medication regimen   3. Insomnia, unspecified type -Continue with current medication regimen  4. HYPERTRIGLYCERIDEMIA - Continue with Tricor  - CBC with Differential/Platelet - Lipid panel - TSH - Hemoglobin A1c - CMP   5. Type 2 diabetes mellitus without complication, without long-term current use of insulin (HCC) - Consider increase in metformin  - We talked about diet and exercise  - CBC with Differential/Platelet - Lipid panel - TSH - Hemoglobin A1c - CMP  6. Prostate cancer screening  - PSA  7. Encounter for screening for HIV  - HIV antibody  8. Need for pneumococcal vaccination  - Pneumococcal polysaccharide vaccine 23-valent greater than or equal to 2yo subcutaneous/IM  Dorothyann Peng, NP

## 2017-09-30 NOTE — Patient Instructions (Signed)
It was great seeing you today   I will follow up with you regarding your labs   Please follow up with me in 3 months for diabetes check   Work on diet and exercise, weigh is up about 9 pounds since September

## 2017-10-01 LAB — HIV ANTIBODY (ROUTINE TESTING W REFLEX): HIV 1&2 Ab, 4th Generation: NONREACTIVE

## 2018-01-04 ENCOUNTER — Other Ambulatory Visit: Payer: Self-pay | Admitting: Adult Health

## 2018-01-04 NOTE — Telephone Encounter (Signed)
Sent to the pharmacy by e-scribe.  Has upcoming appt on 01/05/18.

## 2018-01-05 ENCOUNTER — Encounter: Payer: Self-pay | Admitting: Adult Health

## 2018-01-05 ENCOUNTER — Ambulatory Visit (INDEPENDENT_AMBULATORY_CARE_PROVIDER_SITE_OTHER): Payer: 59 | Admitting: Adult Health

## 2018-01-05 VITALS — BP 124/84 | Temp 98.4°F | Wt 198.0 lb

## 2018-01-05 DIAGNOSIS — E118 Type 2 diabetes mellitus with unspecified complications: Secondary | ICD-10-CM | POA: Diagnosis not present

## 2018-01-05 LAB — POCT GLYCOSYLATED HEMOGLOBIN (HGB A1C): Hemoglobin A1C: 6.8 % — AB (ref 4.0–5.6)

## 2018-01-05 NOTE — Progress Notes (Signed)
 Subjective:    Patient ID: Matthew Patterson, male    DOB: 10/28/1955, 62 y.o.   MRN: [id number]  HPI   62 year old male who  has a past medical history of Anxiety and depression, Diabetes mellitus without complication (HCC), GERD (gastroesophageal reflux disease), and Hyperlipidemia.  He presents to the office today for follow up regarding diabetes.  He is currently prescribed metformin 500 mg twice daily.  During his last office visit, approximately 3 months ago his A1c had increased from 6.0-7.2.  He was asked to make lifestyle modifications and return back in 3 months for recheck.  Today in the office he reports that his blood sugars at home have been below 150 consistently. He denies any hypoglycemic or hypoglycemic events. He has been working on diet and exercise over the last three months   Wt Readings from Last 3 Encounters:  01/05/18 198 lb (89.8 kg)  09/30/17 199 lb (90.3 kg)  07/16/17 197 lb (89.4 kg)    Review of Systems See HPI   Past Medical History:  Diagnosis Date  . Anxiety and depression   . Diabetes mellitus without complication (HCC)   . GERD (gastroesophageal reflux disease)   . Hyperlipidemia     Social History   Socioeconomic History  . Marital status: Married    Spouse name: Not on file  . Number of children: Not on file  . Years of education: Not on file  . Highest education level: Not on file  Occupational History  . Not on file  Social Needs  . Financial resource strain: Not on file  . Food insecurity:    Worry: Not on file    Inability: Not on file  . Transportation needs:    Medical: Not on file    Non-medical: Not on file  Tobacco Use  . Smoking status: Never Smoker  . Smokeless tobacco: Never Used  Substance and Sexual Activity  . Alcohol use: Yes  . Drug use: No  . Sexual activity: Not on file  Lifestyle  . Physical activity:    Days per week: Not on file    Minutes per session: Not on file  . Stress: Not on file  Relationships    . Social connections:    Talks on phone: Not on file    Gets together: Not on file    Attends religious service: Not on file    Active member of club or organization: Not on file    Attends meetings of clubs or organizations: Not on file    Relationship status: Not on file  . Intimate partner violence:    Fear of current or ex partner: Not on file    Emotionally abused: Not on file    Physically abused: Not on file    Forced sexual activity: Not on file  Other Topics Concern  . Not on file  Social History Narrative   Senior buyer for manufacturing    Married   One children    One grandchild      He likes to play golf     Past Surgical History:  Procedure Laterality Date  . ROTATOR CUFF REPAIR    . SHOULDER SURGERY  2010   B/L    Family History  Problem Relation Age of Onset  . Diabetes Mother   . Hypertension Mother   . Cancer Sister        uterine cancer    No Known Allergies  Current Outpatient Medications   on File Prior to Visit  Medication Sig Dispense Refill  . Blood Glucose Monitoring Suppl (ONETOUCH VERIO IQ SYSTEM) w/Device KIT 1 kit 1 kit 0  . citalopram (CELEXA) 20 MG tablet Take 1 tablet (20 mg total) by mouth daily. 30 tablet 0  . fenofibrate (TRICOR) 145 MG tablet TAKE ONE TABLET BY MOUTH DAILY 90 tablet 0  . glucose blood test strip Use as instructed 100 each 12  . Lancets (ONETOUCH ULTRASOFT) lancets Use as instructed 100 each 12  . lisinopril (ZESTRIL) 2.5 MG tablet Take 1 tablet (2.5 mg total) by mouth daily. 90 tablet 3  . metFORMIN (GLUCOPHAGE) 500 MG tablet Take 1 tablet (500 mg total) by mouth 2 (two) times daily with a meal. 180 tablet 3  . omeprazole (PRILOSEC) 20 MG capsule TAKE ONE CAPSULE BY MOUTH TWICE DAILY FOR THREE WEEKS THEN ONE CAPSULE BEFORE EVENING MEAL 90 capsule 0  . traZODone (DESYREL) 50 MG tablet Take 0.5-1 tablets (25-50 mg total) by mouth at bedtime as needed for sleep. 30 tablet 3   No current facility-administered  medications on file prior to visit.     BP 124/84   Temp 98.4 F (36.9 C) (Oral)   Wt 198 lb (89.8 kg)   BMI 29.67 kg/m       Objective:   Physical Exam  Constitutional: He is oriented to person, place, and time. He appears well-developed and well-nourished. No distress.  Cardiovascular: Normal rate, regular rhythm, normal heart sounds and intact distal pulses. Exam reveals no gallop and no friction rub.  No murmur heard. Pulmonary/Chest: Effort normal and breath sounds normal. No stridor. No respiratory distress. He has no wheezes. He has no rales. He exhibits no tenderness.  Neurological: He is alert and oriented to person, place, and time.  Skin: Skin is warm and dry. He is not diaphoretic.  Nursing note and vitals reviewed.     Assessment & Plan:  1. Controlled type 2 diabetes mellitus with complication, without long-term current use of insulin (HCC) - POCT A1C - 6.8 - has improved.  - Continue with life style modifications  - Follow up in 3 months or sooner if needed - Continue with current dose of Metformin    , NP   

## 2018-03-01 ENCOUNTER — Other Ambulatory Visit: Payer: Self-pay | Admitting: Adult Health

## 2018-03-01 DIAGNOSIS — F329 Major depressive disorder, single episode, unspecified: Secondary | ICD-10-CM

## 2018-03-01 DIAGNOSIS — F32A Depression, unspecified: Secondary | ICD-10-CM

## 2018-03-01 DIAGNOSIS — F419 Anxiety disorder, unspecified: Principal | ICD-10-CM

## 2018-03-01 DIAGNOSIS — G4700 Insomnia, unspecified: Secondary | ICD-10-CM

## 2018-03-02 NOTE — Telephone Encounter (Signed)
Last OV 01/05/2018   Last refilled 09/15/2016 disp 30 with 3 refills    Sent to PCP for approval

## 2018-03-26 ENCOUNTER — Other Ambulatory Visit: Payer: Self-pay | Admitting: Adult Health

## 2018-03-29 ENCOUNTER — Other Ambulatory Visit: Payer: Self-pay | Admitting: Adult Health

## 2018-03-29 NOTE — Telephone Encounter (Signed)
Notes recorded by Dorothyann Peng, NP on 09/30/2017 at 12:47 PM EST A1c has increased from 6.0 to 7.2. His cholesterol panel has also increased. I will give him 3 months to change his lifestyle, before we increase medication. Follow up in 3 months   Should pt repeat lab work before filling?

## 2018-03-29 NOTE — Telephone Encounter (Signed)
Ok to refill for 90 days, he has an up coming appointment

## 2018-03-29 NOTE — Telephone Encounter (Signed)
Sent to the pharmacy by e-scribe as directed for 90 days.

## 2018-04-07 ENCOUNTER — Ambulatory Visit (INDEPENDENT_AMBULATORY_CARE_PROVIDER_SITE_OTHER): Payer: 59 | Admitting: Adult Health

## 2018-04-07 ENCOUNTER — Encounter: Payer: Self-pay | Admitting: Adult Health

## 2018-04-07 VITALS — BP 136/88 | Temp 98.3°F | Ht 68.5 in | Wt 200.0 lb

## 2018-04-07 DIAGNOSIS — E118 Type 2 diabetes mellitus with unspecified complications: Secondary | ICD-10-CM | POA: Diagnosis not present

## 2018-04-07 LAB — POCT GLYCOSYLATED HEMOGLOBIN (HGB A1C): HbA1c, POC (controlled diabetic range): 6.6 % (ref 0.0–7.0)

## 2018-04-07 NOTE — Progress Notes (Signed)
Subjective:    Patient ID: Matthew Patterson, male    DOB: Jan 21, 1956, 62 y.o.   MRN: [id number]  HPI  62 year old male who  has a past medical history of Anxiety and depression, Diabetes mellitus without complication ([place]), GERD (gastroesophageal reflux disease), and Hyperlipidemia.   He presents to the office today for follow-up regarding diabetes.  He was last seen in June 2019, at this time his A1c had dropped from 7.2-6.8.  He is currently maintained on metformin 500 mg twice a day.  Any hypoglycemic events.  He reports that he is been working on diet and exercise over the last 3 months.  At home when he monitors his blood sugars they are consistently below 130  Lab Results  Component Value Date   HGBA1C 6.8 (A) 01/05/2018    Review of Systems See HPI   Past Medical History:  Diagnosis Date  . Anxiety and depression   . Diabetes mellitus without complication ([place])   . GERD (gastroesophageal reflux disease)   . Hyperlipidemia     Social History   Socioeconomic History  . Marital status: Married    Spouse name: Not on file  . Number of children: Not on file  . Years of education: Not on file  . Highest education level: Not on file  Occupational History  . Not on file  Social Needs  . Financial resource strain: Not on file  . Food insecurity:    Worry: Not on file    Inability: Not on file  . Transportation needs:    Medical: Not on file    Non-medical: Not on file  Tobacco Use  . Smoking status: Never Smoker  . Smokeless tobacco: Never Used  Substance and Sexual Activity  . Alcohol use: Yes  . Drug use: No  . Sexual activity: Not on file  Lifestyle  . Physical activity:    Days per week: Not on file    Minutes per session: Not on file  . Stress: Not on file  Relationships  . Social connections:    Talks on phone: Not on file    Gets together: Not on file    Attends religious service: Not on file    Active member of club or organization: Not on file   Attends meetings of clubs or organizations: Not on file    Relationship status: Not on file  . Intimate partner violence:    Fear of current or ex partner: Not on file    Emotionally abused: Not on file    Physically abused: Not on file    Forced sexual activity: Not on file  Other Topics Concern  . Not on file  Social History Narrative   Scientist, 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    One grandchild      He likes to play golf     Past Surgical History:  Procedure Laterality Date  . ROTATOR CUFF REPAIR    . SHOULDER SURGERY  2010   B/L    Family History  Problem Relation Age of Onset  . Diabetes Mother   . Hypertension Mother   . Cancer Sister        uterine cancer    No Known Allergies  Current Outpatient Medications on File Prior to Visit  Medication Sig Dispense Refill  . Blood Glucose Monitoring Suppl (ONETOUCH VERIO IQ SYSTEM) w/Device KIT 1 kit 1 kit 0  . citalopram (CELEXA) 20 MG  tablet Take 1 tablet (20 mg total) by mouth daily. 30 tablet 0  . fenofibrate (TRICOR) 145 MG tablet TAKE ONE TABLET BY MOUTH DAILY 90 tablet 0  . glucose blood test strip Use as instructed 100 each 12  . Lancets (ONETOUCH ULTRASOFT) lancets Use as instructed 100 each 12  . lisinopril (ZESTRIL) 2.5 MG tablet Take 1 tablet (2.5 mg total) by mouth daily. 90 tablet 3  . metFORMIN (GLUCOPHAGE) 500 MG tablet TAKE 1 TABLET (500 MG TOTAL) BY MOUTH 2 (TWO) TIMES DAILY WITH A MEAL. 180 tablet 3  . omeprazole (PRILOSEC) 20 MG capsule TAKE ONE CAPSULE BY MOUTH TWICE DAILY FOR THREE WEEKS THEN ONE CAPSULE BEFORE EVENING MEAL 90 capsule 0  . traZODone (DESYREL) 50 MG tablet TAKE 1/2 TO 1 TABLET BY MOUTH EVERY DAY AT BEDTIME AS NEEDED FOR SLEEP 90 tablet 1   No current facility-administered medications on file prior to visit.     BP 136/88   Temp 98.3 F (36.8 C)   Ht 5' 8.5" (1.74 m) Comment: WITHOUT SHOES  Wt 200 lb (90.7 kg)   BMI 29.97 kg/m       Objective:   Physical  Exam  Constitutional: He is oriented to person, place, and time. He appears well-developed and well-nourished. No distress.  Eyes: Pupils are equal, round, and reactive to light. Conjunctivae and EOM are normal. Right eye exhibits no discharge. Left eye exhibits no discharge. No scleral icterus.  Cardiovascular: Normal rate, regular rhythm, normal heart sounds and intact distal pulses. Exam reveals no gallop and no friction rub.  No murmur heard. Pulmonary/Chest: Effort normal and breath sounds normal. No stridor. No respiratory distress. He has no wheezes. He has no rales. He exhibits no tenderness.  Musculoskeletal: Normal range of motion.  Neurological: He is alert and oriented to person, place, and time.  Skin: Skin is warm and dry. Capillary refill takes less than 2 seconds. He is not diaphoretic.  Psychiatric: He has a normal mood and affect. His behavior is normal. Judgment and thought content normal.  Nursing note and vitals reviewed.     Assessment & Plan:  1. Controlled type 2 diabetes mellitus with complication, without long-term current use of insulin (HCC) - POC HgB A1c - 6.6 - has improved.  - Continue to exercise and eat healthy  - We will give him a 6 months break since A1c has been well controlled. Follow up in 6 months for CPE or sooner if needed  Dorothyann Peng, NP

## 2018-05-23 ENCOUNTER — Other Ambulatory Visit: Payer: Self-pay | Admitting: Adult Health

## 2018-05-23 DIAGNOSIS — E119 Type 2 diabetes mellitus without complications: Secondary | ICD-10-CM

## 2018-05-24 NOTE — Telephone Encounter (Signed)
Sent to the pharmacy by e-scribe. 

## 2018-07-13 ENCOUNTER — Other Ambulatory Visit: Payer: Self-pay | Admitting: Adult Health

## 2018-07-14 NOTE — Telephone Encounter (Signed)
Sent to the pharmacy by e-scribe. 

## 2018-08-21 ENCOUNTER — Other Ambulatory Visit: Payer: Self-pay | Admitting: Adult Health

## 2018-08-21 DIAGNOSIS — F419 Anxiety disorder, unspecified: Principal | ICD-10-CM

## 2018-08-21 DIAGNOSIS — F329 Major depressive disorder, single episode, unspecified: Secondary | ICD-10-CM

## 2018-08-21 DIAGNOSIS — G4700 Insomnia, unspecified: Secondary | ICD-10-CM

## 2018-08-21 DIAGNOSIS — F32A Depression, unspecified: Secondary | ICD-10-CM

## 2018-09-30 ENCOUNTER — Ambulatory Visit (INDEPENDENT_AMBULATORY_CARE_PROVIDER_SITE_OTHER): Payer: 59 | Admitting: Adult Health

## 2018-09-30 ENCOUNTER — Encounter: Payer: Self-pay | Admitting: Adult Health

## 2018-09-30 ENCOUNTER — Other Ambulatory Visit: Payer: Self-pay | Admitting: Family Medicine

## 2018-09-30 VITALS — BP 130/84 | Temp 98.4°F | Ht 69.0 in | Wt 193.0 lb

## 2018-09-30 DIAGNOSIS — G4700 Insomnia, unspecified: Secondary | ICD-10-CM

## 2018-09-30 DIAGNOSIS — Z0000 Encounter for general adult medical examination without abnormal findings: Secondary | ICD-10-CM | POA: Diagnosis not present

## 2018-09-30 DIAGNOSIS — F419 Anxiety disorder, unspecified: Secondary | ICD-10-CM | POA: Diagnosis not present

## 2018-09-30 DIAGNOSIS — F329 Major depressive disorder, single episode, unspecified: Secondary | ICD-10-CM

## 2018-09-30 DIAGNOSIS — E781 Pure hyperglyceridemia: Secondary | ICD-10-CM | POA: Diagnosis not present

## 2018-09-30 DIAGNOSIS — E118 Type 2 diabetes mellitus with unspecified complications: Secondary | ICD-10-CM | POA: Diagnosis not present

## 2018-09-30 DIAGNOSIS — Z125 Encounter for screening for malignant neoplasm of prostate: Secondary | ICD-10-CM | POA: Diagnosis not present

## 2018-09-30 DIAGNOSIS — F32A Depression, unspecified: Secondary | ICD-10-CM

## 2018-09-30 LAB — CBC WITH DIFFERENTIAL/PLATELET
BASOS ABS: 0 10*3/uL (ref 0.0–0.1)
Basophils Relative: 0.8 % (ref 0.0–3.0)
Eosinophils Absolute: 0.1 10*3/uL (ref 0.0–0.7)
Eosinophils Relative: 2 % (ref 0.0–5.0)
HCT: 45.8 % (ref 39.0–52.0)
Hemoglobin: 15.7 g/dL (ref 13.0–17.0)
Lymphocytes Relative: 25.5 % (ref 12.0–46.0)
Lymphs Abs: 1.3 10*3/uL (ref 0.7–4.0)
MCHC: 34.3 g/dL (ref 30.0–36.0)
MCV: 93.2 fl (ref 78.0–100.0)
MONO ABS: 0.3 10*3/uL (ref 0.1–1.0)
MONOS PCT: 6.2 % (ref 3.0–12.0)
NEUTROS ABS: 3.4 10*3/uL (ref 1.4–7.7)
NEUTROS PCT: 65.5 % (ref 43.0–77.0)
PLATELETS: 228 10*3/uL (ref 150.0–400.0)
RBC: 4.91 Mil/uL (ref 4.22–5.81)
RDW: 12.8 % (ref 11.5–15.5)
WBC: 5.2 10*3/uL (ref 4.0–10.5)

## 2018-09-30 LAB — LIPID PANEL
Cholesterol: 245 mg/dL — ABNORMAL HIGH (ref 0–200)
HDL: 51.6 mg/dL (ref >39.00)
NONHDL: 193.69
Total CHOL/HDL Ratio: 5
Triglycerides: 267 mg/dL — ABNORMAL HIGH (ref 0.0–149.0)
VLDL: 53.4 mg/dL — AB (ref 0.0–40.0)

## 2018-09-30 LAB — COMPREHENSIVE METABOLIC PANEL
ALT: 34 U/L (ref 0–53)
AST: 23 U/L (ref 0–37)
Albumin: 4.7 g/dL (ref 3.5–5.2)
Alkaline Phosphatase: 40 U/L (ref 39–117)
BUN: 26 mg/dL — ABNORMAL HIGH (ref 6–23)
CO2: 27 meq/L (ref 19–32)
Calcium: 9.6 mg/dL (ref 8.4–10.5)
Chloride: 104 mEq/L (ref 96–112)
Creatinine, Ser: 1.28 mg/dL (ref 0.40–1.50)
GFR: 56.79 mL/min — AB (ref >60.00)
GLUCOSE: 126 mg/dL — AB (ref 70–99)
POTASSIUM: 4.2 meq/L (ref 3.5–5.1)
SODIUM: 142 meq/L (ref 135–145)
Total Bilirubin: 0.5 mg/dL (ref 0.2–1.2)
Total Protein: 7.1 g/dL (ref 6.0–8.3)

## 2018-09-30 LAB — PSA: PSA: 0.7 ng/mL (ref 0.10–4.00)

## 2018-09-30 LAB — LDL CHOLESTEROL, DIRECT: Direct LDL: 167 mg/dL

## 2018-09-30 LAB — TSH: TSH: 1.93 u[IU]/mL (ref 0.35–4.50)

## 2018-09-30 LAB — HEMOGLOBIN A1C: Hgb A1c MFr Bld: 6.4 % (ref 4.6–6.5)

## 2018-09-30 MED ORDER — ATORVASTATIN CALCIUM 20 MG PO TABS: 20.0000 mg | ORAL_TABLET | Freq: Every day | ORAL | 3 refills | Status: DC

## 2018-09-30 NOTE — Telephone Encounter (Signed)
Sent to the pharmacy by e-scribe. 

## 2018-09-30 NOTE — Progress Notes (Signed)
Subjective:    Patient ID: Matthew Patterson, male    DOB: 10-28-1955, 63 y.o.   MRN: [id number]  HPI  Patient presents for yearly preventative medicine examination. He is a pleasant 63 year old male who  has a past medical history of Anxiety and depression, Diabetes mellitus without complication ([place]), GERD (gastroesophageal reflux disease), and Hyperlipidemia.   Anxiety and Depression -feels well controlled without medications.  Insomnia -Takes trazodone as needed  Hyperlipidemia -Personal assistant.  Denies myalgias Lab Results  Component Value Date   CHOL 258 (H) 09/30/2017   HDL 44.30 09/30/2017   LDLCALC 181 (H) 09/30/2017   LDLDIRECT 97.0 02/18/2015   TRIG 162.0 (H) 09/30/2017   CHOLHDL 6 09/30/2017   Anxiety and Depression - he feels as though this is well controlled without medication  Insomnia  - Is controlled with Trazodone PRN for sleep disturbance- stable   DM -Controlled with metformin. He reports that he has only been taking this once a day and his blood sugars are constantly below 150.   He does take lisinopril 2.5 mg for kidney protection Lab Results  Component Value Date   HGBA1C 6.6 04/07/2018    All immunizations and health maintenance protocols were reviewed with the patient and needed orders were placed. UTD  Appropriate screening laboratory values were ordered for the patient including screening of hyperlipidemia, renal function and hepatic function. If indicated by BPH, a PSA was ordered.  Medication reconciliation,  past medical history, social history, problem list and allergies were reviewed in detail with the patient  Goals were established with regard to weight loss, exercise, and  diet in compliance with medications  He is up-to-date on routine screening colonoscopies, he participates in dental and vision screens. He is eating healthy and working on portion control. He is staying active.   Wt Readings from Last 3 Encounters:  09/30/18 193 lb  (87.5 kg)  04/07/18 200 lb (90.7 kg)  01/05/18 198 lb (89.8 kg)   He has no acute complaints.   Review of Systems  Constitutional: Negative.   HENT: Negative.   Eyes: Negative.   Respiratory: Negative.   Cardiovascular: Negative.   Gastrointestinal: Negative.   Endocrine: Negative.   Genitourinary: Negative.   Musculoskeletal: Negative.   Skin: Negative.   Allergic/Immunologic: Negative.   Neurological: Negative.   Hematological: Negative.   Psychiatric/Behavioral: Negative.   All other systems reviewed and are negative.  Past Medical History:  Diagnosis Date  . Anxiety and depression   . Diabetes mellitus without complication ([place])   . GERD (gastroesophageal reflux disease)   . Hyperlipidemia     Social History   Socioeconomic History  . Marital status: Married    Spouse name: Not on file  . Number of children: Not on file  . Years of education: Not on file  . Highest education level: Not on file  Occupational History  . Not on file  Social Needs  . Financial resource strain: Not on file  . Food insecurity:    Worry: Not on file    Inability: Not on file  . Transportation needs:    Medical: Not on file    Non-medical: Not on file  Tobacco Use  . Smoking status: Never Smoker  . Smokeless tobacco: Never Used  Substance and Sexual Activity  . Alcohol use: Yes  . Drug use: No  . Sexual activity: Not on file  Lifestyle  . Physical activity:    Days per week: Not on  file    Minutes per session: Not on file  . Stress: Not on file  Relationships  . Social connections:    Talks on phone: Not on file    Gets together: Not on file    Attends religious service: Not on file    Active member of club or organization: Not on file    Attends meetings of clubs or organizations: Not on file    Relationship status: Not on file  . Intimate partner violence:    Fear of current or ex partner: Not on file    Emotionally abused: Not on file    Physically abused: Not on  file    Forced sexual activity: Not on file  Other Topics Concern  . Not on file  Social History Narrative   Scientist, 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    One grandchild      He likes to play golf     Past Surgical History:  Procedure Laterality Date  . ROTATOR CUFF REPAIR    . SHOULDER SURGERY  2010   B/L    Family History  Problem Relation Age of Onset  . Diabetes Mother   . Hypertension Mother   . Cancer Sister        uterine cancer    No Known Allergies  Current Outpatient Medications on File Prior to Visit  Medication Sig Dispense Refill  . Blood Glucose Monitoring Suppl (ONETOUCH VERIO IQ SYSTEM) w/Device KIT 1 kit 1 kit 0  . citalopram (CELEXA) 20 MG tablet Take 1 tablet (20 mg total) by mouth daily. 30 tablet 0  . fenofibrate (TRICOR) 145 MG tablet TAKE ONE TABLET BY MOUTH DAILY 90 tablet 0  . glucose blood test strip Use as instructed 100 each 12  . Lancets (ONETOUCH ULTRASOFT) lancets Use as instructed 100 each 12  . lisinopril (PRINIVIL,ZESTRIL) 2.5 MG tablet TAKE 1 TABLET BY MOUTH EVERY DAY 90 tablet 1  . metFORMIN (GLUCOPHAGE) 500 MG tablet TAKE 1 TABLET (500 MG TOTAL) BY MOUTH 2 (TWO) TIMES DAILY WITH A MEAL. 180 tablet 3  . omeprazole (PRILOSEC) 20 MG capsule TAKE ONE CAPSULE BY MOUTH TWICE DAILY FOR THREE WEEKS THEN ONE CAPSULE BEFORE EVENING MEAL 90 capsule 0  . traZODone (DESYREL) 50 MG tablet TAKE 1/2 TO 1 TABLET BY MOUTH EVERY DAY AT BEDTIME AS NEEDED FOR SLEEP 90 tablet 1   No current facility-administered medications on file prior to visit.     There were no vitals taken for this visit.      Objective:   Physical Exam Vitals signs and nursing note reviewed.  Constitutional:      General: He is not in acute distress.    Appearance: He is well-developed. He is not diaphoretic.  HENT:     Head: Normocephalic and atraumatic.     Right Ear: External ear normal.     Left Ear: External ear normal.     Nose: Nose normal.      Mouth/Throat:     Pharynx: No oropharyngeal exudate.  Eyes:     General:        Right eye: No discharge.        Left eye: No discharge.     Conjunctiva/sclera: Conjunctivae normal.     Pupils: Pupils are equal, round, and reactive to light.  Neck:     Thyroid: No thyromegaly.     Trachea: No tracheal deviation.  Cardiovascular:  Rate and Rhythm: Normal rate and regular rhythm.     Heart sounds: Normal heart sounds. No murmur. No friction rub. No gallop.   Pulmonary:     Effort: Pulmonary effort is normal. No respiratory distress.     Breath sounds: Normal breath sounds. No wheezing or rales.  Chest:     Chest wall: No tenderness.  Abdominal:     General: Bowel sounds are normal. There is no distension.     Palpations: Abdomen is soft.     Tenderness: There is no abdominal tenderness. There is no guarding or rebound.  Musculoskeletal: Normal range of motion.  Lymphadenopathy:     Cervical: No cervical adenopathy.  Skin:    General: Skin is warm and dry.     Coloration: Skin is not pale.     Findings: No erythema or rash.  Neurological:     Mental Status: He is alert and oriented to person, place, and time.     Cranial Nerves: No cranial nerve deficit.     Coordination: Coordination normal.  Psychiatric:        Thought Content: Thought content normal.        Judgment: Judgment normal.       Assessment & Plan:  1. Routine general medical examination at a health care facility - Follow up in one year  - Congratulated on weight loss  - CBC with Differential/Platelet - Comprehensive metabolic panel - Lipid panel - TSH - Hemoglobin A1c  2. Controlled type 2 diabetes mellitus with complication, without long-term current use of insulin ([place]) - Consider having him go back to Metformin BID  - CBC with Differential/Platelet - Comprehensive metabolic panel - Lipid panel - TSH - Hemoglobin A1c  3. Anxiety and depression - Controlled.   4. Insomnia, unspecified type -  Continue with Trazodone PRN   5. HYPERTRIGLYCERIDEMIA - Consider statin  - CBC with Differential/Platelet - Comprehensive metabolic panel - Lipid panel - TSH - Hemoglobin A1c  6. Prostate cancer screening  - PSA  Dorothyann Peng

## 2018-09-30 NOTE — Patient Instructions (Signed)
It was great seeing you today   We will follow up with you regarding your blood work   Continue with diet and exercise   Please let me know if you need anything

## 2018-10-14 ENCOUNTER — Other Ambulatory Visit: Payer: Self-pay | Admitting: Adult Health

## 2018-10-14 NOTE — Telephone Encounter (Signed)
Sent to the pharmacy by e-scribe. 

## 2018-11-10 ENCOUNTER — Other Ambulatory Visit: Payer: Self-pay | Admitting: Adult Health

## 2018-11-10 DIAGNOSIS — E119 Type 2 diabetes mellitus without complications: Secondary | ICD-10-CM

## 2018-11-10 NOTE — Telephone Encounter (Signed)
Sent to the pharmacy by e-scribe. 

## 2019-02-01 ENCOUNTER — Other Ambulatory Visit: Payer: Self-pay | Admitting: Adult Health

## 2019-02-01 DIAGNOSIS — E781 Pure hyperglyceridemia: Secondary | ICD-10-CM

## 2019-02-01 DIAGNOSIS — F32A Depression, unspecified: Secondary | ICD-10-CM

## 2019-02-01 DIAGNOSIS — E118 Type 2 diabetes mellitus with unspecified complications: Secondary | ICD-10-CM

## 2019-02-01 DIAGNOSIS — F329 Major depressive disorder, single episode, unspecified: Secondary | ICD-10-CM

## 2019-02-01 DIAGNOSIS — G4700 Insomnia, unspecified: Secondary | ICD-10-CM

## 2019-02-01 NOTE — Telephone Encounter (Signed)
Ok to refill for 90 days but lets get an A1c and lipid panel done on him.   Labs entered

## 2019-02-01 NOTE — Telephone Encounter (Signed)
Sent to the pharmacy by e-scribe for 90 days.  Pt now scheduled for lab work and follow up with Tommi Rumps.

## 2019-02-06 ENCOUNTER — Other Ambulatory Visit: Payer: Self-pay

## 2019-02-06 ENCOUNTER — Other Ambulatory Visit (INDEPENDENT_AMBULATORY_CARE_PROVIDER_SITE_OTHER): Payer: 59

## 2019-02-06 DIAGNOSIS — E781 Pure hyperglyceridemia: Secondary | ICD-10-CM

## 2019-02-06 DIAGNOSIS — E118 Type 2 diabetes mellitus with unspecified complications: Secondary | ICD-10-CM

## 2019-02-06 LAB — LIPID PANEL
Cholesterol: 174 mg/dL (ref 0–200)
HDL: 47.4 mg/dL (ref >39.00)
LDL Cholesterol: 93 mg/dL (ref 0–99)
NonHDL: 126.89
Total CHOL/HDL Ratio: 4
Triglycerides: 171 mg/dL — ABNORMAL HIGH (ref 0.0–149.0)
VLDL: 34.2 mg/dL (ref 0.0–40.0)

## 2019-02-06 LAB — BASIC METABOLIC PANEL
BUN: 32 mg/dL — ABNORMAL HIGH (ref 6–23)
CO2: 26 mEq/L (ref 19–32)
Calcium: 8.7 mg/dL (ref 8.4–10.5)
Chloride: 109 mEq/L (ref 96–112)
Creatinine, Ser: 1.5 mg/dL (ref 0.40–1.50)
GFR: 47.23 mL/min — ABNORMAL LOW (ref >60.00)
Glucose, Bld: 143 mg/dL — ABNORMAL HIGH (ref 70–99)
Potassium: 4.3 mEq/L (ref 3.5–5.1)
Sodium: 143 mEq/L (ref 135–145)

## 2019-02-06 LAB — HEMOGLOBIN A1C: Hgb A1c MFr Bld: 6.6 % — ABNORMAL HIGH (ref 4.6–6.5)

## 2019-02-07 ENCOUNTER — Other Ambulatory Visit: Payer: Self-pay

## 2019-02-07 ENCOUNTER — Encounter: Payer: Self-pay | Admitting: Adult Health

## 2019-02-07 ENCOUNTER — Ambulatory Visit (INDEPENDENT_AMBULATORY_CARE_PROVIDER_SITE_OTHER): Payer: 59 | Admitting: Adult Health

## 2019-02-07 DIAGNOSIS — E781 Pure hyperglyceridemia: Secondary | ICD-10-CM | POA: Diagnosis not present

## 2019-02-07 DIAGNOSIS — E118 Type 2 diabetes mellitus with unspecified complications: Secondary | ICD-10-CM

## 2019-02-07 NOTE — Progress Notes (Signed)
Virtual Visit via Video Note  I connected with Matthew Patterson on 02/07/19 at  8:00 AM EDT by a video enabled telemedicine application and verified that I am speaking with the correct person using two identifiers.  Location patient: home Location provider:work or home office Persons participating in the virtual visit: patient, provider  I discussed the limitations of evaluation and management by telemedicine and the availability of in person appointments. The patient expressed understanding and agreed to proceed.   HPI: 63 year old male who is being evaluated today for 40-monthfollow-up regarding diabetes and hyperlipidemia.  DM -is currently controlled with metformin 500 mg daily. His last A1c in February 2020.  He is working on diet and exercise.  Denies any symptoms of hypoglycemia.  Does not have any GI issues with metformin  Hyperlipidemia -was started on Lipitor 20 mg in February 2020, at this time he was taking TriCor but his cholesterol panel was not improving..Marland KitchenHe denies any side effects such as myalgias since taking statin medication.    Lab Results  Component Value Date   CHOL 174 02/06/2019   CHOL 245 (H) 09/30/2018   CHOL 258 (H) 09/30/2017   Lab Results  Component Value Date   HDL 47.40 02/06/2019   HDL 51.60 09/30/2018   HDL 44.30 09/30/2017   Lab Results  Component Value Date   LDLCALC 93 02/06/2019   LDLCALC 181 (H) 09/30/2017   LDLCALC 154 (H) 09/29/2016   Lab Results  Component Value Date   TRIG 171.0 (H) 02/06/2019   TRIG 267.0 (H) 09/30/2018   TRIG 162.0 (H) 09/30/2017   Lab Results  Component Value Date   CHOLHDL 4 02/06/2019   CHOLHDL 5 09/30/2018   CHOLHDL 6 09/30/2017   Lab Results  Component Value Date   LDLDIRECT 167.0 09/30/2018   LDLDIRECT 97.0 02/18/2015   LDLDIRECT 115.5 04/21/2013     ROS: See pertinent positives and negatives per HPI.  Past Medical History:  Diagnosis Date  . Anxiety and depression   . [place]   . GERD (gastroesophageal reflux disease)   . Hyperlipidemia     Past Surgical History:  Procedure Laterality Date  . ROTATOR CUFF REPAIR    . SHOULDER SURGERY  2010   B/L    Family History  Problem Relation Age of Onset  . Diabetes Mother   . Hypertension Mother   . Cancer Sister        uterine cancer     Current Outpatient Medications:  .  atorvastatin (LIPITOR) 20 MG tablet, Take 1 tablet (20 mg total) by mouth daily., Disp: 90 tablet, Rfl: 3 .  Blood Glucose Monitoring Suppl (ONETOUCH VERIO IQ SYSTEM) w/Device KIT, 1 kit, Disp: 1 kit, Rfl: 0 .  fenofibrate (TRICOR) 145 MG tablet, TAKE ONE TABLET BY MOUTH DAILY, Disp: 90 tablet, Rfl: 3 .  glucose blood test strip, Use as instructed, Disp: 100 each, Rfl: 12 .  Lancets (ONETOUCH ULTRASOFT) lancets, Use as instructed, Disp: 100 each, Rfl: 12 .  lisinopril (PRINIVIL,ZESTRIL) 2.5 MG tablet, TAKE 1 TABLET BY MOUTH EVERY DAY, Disp: 90 tablet, Rfl: 1 .  metFORMIN (GLUCOPHAGE) 500 MG tablet, TAKE 1 TABLET (500 MG TOTAL) BY MOUTH 2 (TWO) TIMES DAILY WITH A MEAL., Disp: 180 tablet, Rfl: 0 .  omeprazole (PRILOSEC) 20 MG capsule, TAKE ONE CAPSULE BY MOUTH TWICE DAILY FOR THREE WEEKS THEN ONE CAPSULE BEFORE EVENING MEAL, Disp: 90 capsule, Rfl: 0 .  traZODone (DESYREL) 50 MG tablet, TAKE  1/2 TO 1 TABLET BY MOUTH EVERY DAY AT BEDTIME AS NEEDED FOR SLEEP, Disp: 90 tablet, Rfl: 0  EXAM:  VITALS per patient if applicable:  GENERAL: alert, oriented, appears well and in no acute distress  HEENT: atraumatic, conjunttiva clear, no obvious abnormalities on inspection of external nose and ears  NECK: normal movements of the head and neck  LUNGS: on inspection no signs of respiratory distress, breathing rate appears normal, no obvious gross SOB, gasping or wheezing  CV: no obvious cyanosis  MS: moves all visible extremities without noticeable abnormality  PSYCH/NEURO: pleasant and cooperative, no obvious  depression or anxiety, speech and thought processing grossly intact  ASSESSMENT AND PLAN:  Discussed the following assessment and plan:  1. Controlled type 2 diabetes mellitus with complication, without long-term current use of insulin ([facility]) -Increased slightly.  Advised to work on diet and exercise.  No dose change.  He has been controlled for a long time.  We will follow-up in February at his CPE or sooner before that  2. HYPERTRIGLYCERIDEMIA -Has improved significantly since starting statin.  No dose change.  We will follow-up with repeat lab at his physical in February     I discussed the assessment and treatment plan with the patient. The patient was provided an opportunity to ask questions and all were answered. The patient agreed with the plan and demonstrated an understanding of the instructions.   The patient was advised to call back or seek an in-person evaluation if the symptoms worsen or if the condition fails to improve as anticipated.   Dorothyann Peng, NP

## 2019-05-06 ENCOUNTER — Other Ambulatory Visit: Payer: Self-pay | Admitting: Adult Health

## 2019-05-06 DIAGNOSIS — E119 Type 2 diabetes mellitus without complications: Secondary | ICD-10-CM

## 2019-05-09 NOTE — Telephone Encounter (Signed)
Sent to the pharmacy by e-scribe. 

## 2019-07-08 ENCOUNTER — Other Ambulatory Visit: Payer: Self-pay | Admitting: Adult Health

## 2019-07-11 NOTE — Telephone Encounter (Signed)
Sent to the pharmacy by e-scribe. 

## 2019-10-01 ENCOUNTER — Emergency Department (HOSPITAL_BASED_OUTPATIENT_CLINIC_OR_DEPARTMENT_OTHER)
Admission: EM | Admit: 2019-10-01 | Discharge: 2019-10-01 | Disposition: A | Discharge status: Home / Self Care | Payer: 59 | Attending: Emergency Medicine | Admitting: Emergency Medicine

## 2019-10-01 ENCOUNTER — Encounter (HOSPITAL_BASED_OUTPATIENT_CLINIC_OR_DEPARTMENT_OTHER): Payer: Self-pay | Admitting: Emergency Medicine

## 2019-10-01 ENCOUNTER — Emergency Department (HOSPITAL_BASED_OUTPATIENT_CLINIC_OR_DEPARTMENT_OTHER): Payer: 59

## 2019-10-01 ENCOUNTER — Other Ambulatory Visit: Payer: Self-pay

## 2019-10-01 DIAGNOSIS — K5732 Diverticulitis of large intestine without perforation or abscess without bleeding: Secondary | ICD-10-CM | POA: Diagnosis not present

## 2019-10-01 DIAGNOSIS — Z7984 Long term (current) use of oral hypoglycemic drugs: Secondary | ICD-10-CM | POA: Insufficient documentation

## 2019-10-01 DIAGNOSIS — Z79899 Other long term (current) drug therapy: Secondary | ICD-10-CM | POA: Insufficient documentation

## 2019-10-01 DIAGNOSIS — E119 Type 2 diabetes mellitus without complications: Secondary | ICD-10-CM | POA: Insufficient documentation

## 2019-10-01 DIAGNOSIS — R1030 Lower abdominal pain, unspecified: Secondary | ICD-10-CM

## 2019-10-01 DIAGNOSIS — E785 Hyperlipidemia, unspecified: Secondary | ICD-10-CM | POA: Diagnosis not present

## 2019-10-01 DIAGNOSIS — K5792 Diverticulitis of intestine, part unspecified, without perforation or abscess without bleeding: Secondary | ICD-10-CM

## 2019-10-01 DIAGNOSIS — R102 Pelvic and perineal pain: Secondary | ICD-10-CM | POA: Diagnosis present

## 2019-10-01 LAB — URINALYSIS, ROUTINE W REFLEX MICROSCOPIC
Bilirubin Urine: NEGATIVE
Glucose, UA: 250 mg/dL — AB
Hgb urine dipstick: NEGATIVE
Ketones, ur: NEGATIVE mg/dL
Leukocytes,Ua: NEGATIVE
Nitrite: NEGATIVE
Protein, ur: NEGATIVE mg/dL
Specific Gravity, Urine: 1.02 (ref 1.005–1.030)
pH: 7 (ref 5.0–8.0)

## 2019-10-01 LAB — COMPREHENSIVE METABOLIC PANEL
ALT: 23 U/L (ref 0–44)
AST: 16 U/L (ref 15–41)
Albumin: 4 g/dL (ref 3.5–5.0)
Alkaline Phosphatase: 49 U/L (ref 38–126)
Anion gap: 9 (ref 5–15)
BUN: 15 mg/dL (ref 8–23)
CO2: 25 mmol/L (ref 22–32)
Calcium: 8.7 mg/dL — ABNORMAL LOW (ref 8.9–10.3)
Chloride: 102 mmol/L (ref 98–111)
Creatinine, Ser: 1.21 mg/dL (ref 0.61–1.24)
GFR calc Af Amer: >60 mL/min (ref >60)
GFR calc non Af Amer: >60 mL/min (ref >60)
Glucose, Bld: 207 mg/dL — ABNORMAL HIGH (ref 70–99)
Potassium: 4.3 mmol/L (ref 3.5–5.1)
Sodium: 136 mmol/L (ref 135–145)
Total Bilirubin: 1.6 mg/dL — ABNORMAL HIGH (ref 0.3–1.2)
Total Protein: 7.5 g/dL (ref 6.5–8.1)

## 2019-10-01 LAB — CBC WITH DIFFERENTIAL/PLATELET
Abs Immature Granulocytes: 0.06 10*3/uL (ref 0.00–0.07)
Basophils Absolute: 0.1 10*3/uL (ref 0.0–0.1)
Basophils Relative: 1 %
Eosinophils Absolute: 0.1 10*3/uL (ref 0.0–0.5)
Eosinophils Relative: 1 %
HCT: 47 % (ref 39.0–52.0)
Hemoglobin: 15.8 g/dL (ref 13.0–17.0)
Immature Granulocytes: 1 %
Lymphocytes Relative: 9 %
Lymphs Abs: 0.9 10*3/uL (ref 0.7–4.0)
MCH: 31.6 pg (ref 26.0–34.0)
MCHC: 33.6 g/dL (ref 30.0–36.0)
MCV: 94 fL (ref 80.0–100.0)
Monocytes Absolute: 0.8 10*3/uL (ref 0.1–1.0)
Monocytes Relative: 7 %
Neutro Abs: 8.4 10*3/uL — ABNORMAL HIGH (ref 1.7–7.7)
Neutrophils Relative %: 81 %
Platelets: 219 10*3/uL (ref 150–400)
RBC: 5 MIL/uL (ref 4.22–5.81)
RDW: 11.9 % (ref 11.5–15.5)
WBC: 10.2 10*3/uL (ref 4.0–10.5)
nRBC: 0 % (ref 0.0–0.2)

## 2019-10-01 LAB — LIPASE, BLOOD: Lipase: 22 U/L (ref 11–51)

## 2019-10-01 LAB — LACTIC ACID, PLASMA: Lactic Acid, Venous: 1.5 mmol/L (ref 0.5–1.9)

## 2019-10-01 MED ORDER — OXYCODONE-ACETAMINOPHEN 5-325 MG PO TABS: 1.0000 | ORAL_TABLET | Freq: Four times a day (QID) | ORAL | 0 refills | Status: DC | PRN

## 2019-10-01 MED ORDER — CIPROFLOXACIN HCL 500 MG PO TABS: 500.0000 mg | ORAL_TABLET | Freq: Two times a day (BID) | ORAL | 0 refills | Status: DC

## 2019-10-01 MED ORDER — IOHEXOL 300 MG/ML  SOLN
100.0000 mL | Freq: Once | INTRAMUSCULAR | Status: AC | PRN
  Administered 2019-10-01: 100 mL via INTRAVENOUS

## 2019-10-01 MED ORDER — MORPHINE SULFATE (PF) 4 MG/ML IV SOLN
4.0000 mg | Freq: Once | INTRAVENOUS | Status: AC
  Administered 2019-10-01: 4 mg via INTRAVENOUS
  Filled 2019-10-01: qty 1

## 2019-10-01 MED ORDER — LEVOFLOXACIN IN D5W 750 MG/150ML IV SOLN
750.0000 mg | Freq: Once | INTRAVENOUS | Status: AC
  Administered 2019-10-01: 750 mg via INTRAVENOUS
  Filled 2019-10-01: qty 150

## 2019-10-01 MED ORDER — LACTATED RINGERS IV BOLUS
1000.0000 mL | Freq: Once | INTRAVENOUS | Status: AC
  Administered 2019-10-01: 08:00:00 1000 mL via INTRAVENOUS

## 2019-10-01 MED ORDER — METRONIDAZOLE 500 MG PO TABS: 500.0000 mg | ORAL_TABLET | Freq: Three times a day (TID) | ORAL | 0 refills | Status: DC

## 2019-10-01 MED ORDER — METRONIDAZOLE IN NACL 5-0.79 MG/ML-% IV SOLN
500.0000 mg | Freq: Once | INTRAVENOUS | Status: AC
  Administered 2019-10-01: 500 mg via INTRAVENOUS
  Filled 2019-10-01: qty 100

## 2019-10-01 NOTE — ED Provider Notes (Signed)
[facility] EMERGENCY DEPARTMENT Provider Note   CSN: [id number] Arrival date & time: 10/01/19  0544     History Chief Complaint  Patient presents with  . Abdominal Pain    Matthew Patterson is a 64 y.o. male.  The history is provided by the patient.  Abdominal Pain He has history of diabetes, hyperlipidemia and comes in complaining of suprapubic pain for the last 3 days.  Pain is constant and he rates it at 3/10.  It is worse with changing positions.  Nothing makes it better.  He denies fever, chills, sweats.  He denies nausea, vomiting, diarrhea.  He has noted urinary frequency without dysuria, urgency, tenesmus.  Pain does not radiate.  He has not done anything to treat it.   Past Medical History:  Diagnosis Date  . Anxiety and depression   . Diabetes mellitus without complication ([place])   . GERD (gastroesophageal reflux disease)   . Hyperlipidemia     Patient Active Problem List   Diagnosis Date Noted  . Controlled diabetes mellitus type 2 with complications ([place]) 70/35/0093  . Anxiety and depression 09/15/2016  . Insomnia 09/15/2016  . Impaired glucose tolerance 02/25/2015  . Degeneration of cervical intervertebral disc 01/29/2014  . Chest tightness 11/15/2012  . Reflux esophagitis 02/11/2012  . Routine general medical examination at a health care facility 02/11/2012  . Left ankle pain 08/20/2011  . OTHER ACUTE REACTIONS TO STRESS 02/10/2008  . HYPERTRIGLYCERIDEMIA 10/27/2007    Past Surgical History:  Procedure Laterality Date  . ROTATOR CUFF REPAIR    . SHOULDER SURGERY  2010   B/L       Family History  Problem Relation Age of Onset  . Diabetes Mother   . Hypertension Mother   . Cancer Sister        uterine cancer    Social History   Tobacco Use  . Smoking status: Never Smoker  . Smokeless tobacco: Never Used  Substance Use Topics  . Alcohol use: Yes    Alcohol/week: 1.0 - 2.0 standard drinks    Types: 1 - 2 Cans of beer per week  .  Drug use: No    Home Medications Prior to Admission medications   Medication Sig Start Date End Date Taking? Authorizing Provider  atorvastatin (LIPITOR) 20 MG tablet Take 1 tablet (20 mg total) by mouth daily. 09/30/18  Yes Nafziger, Tommi Rumps, NP  fenofibrate (TRICOR) 145 MG tablet TAKE 1 TABLET BY MOUTH EVERY DAY 07/11/19  Yes Nafziger, Tommi Rumps, NP  lisinopril (ZESTRIL) 2.5 MG tablet TAKE 1 TABLET BY MOUTH EVERY DAY 05/09/19  Yes Nafziger, Tommi Rumps, NP  metFORMIN (GLUCOPHAGE) 500 MG tablet TAKE 1 TABLET (500 MG TOTAL) BY MOUTH 2 (TWO) TIMES DAILY WITH A MEAL. Patient taking differently: Take 500 mg by mouth daily with breakfast.  02/01/19  Yes Nafziger, Tommi Rumps, NP  omeprazole (PRILOSEC) 20 MG capsule TAKE ONE CAPSULE BY MOUTH TWICE DAILY FOR THREE WEEKS THEN ONE CAPSULE BEFORE EVENING MEAL 05/28/15  Yes Dorena Cookey, MD  traZODone (DESYREL) 50 MG tablet TAKE 1/2 TO 1 TABLET BY MOUTH EVERY DAY AT BEDTIME AS NEEDED FOR SLEEP 02/01/19  Yes Nafziger, Tommi Rumps, NP  Blood Glucose Monitoring Suppl (ONETOUCH VERIO IQ SYSTEM) w/Device KIT 1 kit 12/29/16   Dorothyann Peng, NP  glucose blood test strip Use as instructed 12/29/16   Dorothyann Peng, NP  Lancets [facility] ULTRASOFT) lancets Use as instructed 12/29/16   Dorothyann Peng, NP    Allergies    Patient  has no known allergies.  Review of Systems   Review of Systems  Gastrointestinal: Positive for abdominal pain.  All other systems reviewed and are negative.   Physical Exam Updated Vital Signs BP (!) 144/102 (BP Location: Right Arm)   Pulse 81   Temp 98.1 F (36.7 C) (Oral)   Resp 18   Ht 5' 9"  (1.753 m)   Wt 85.7 kg   SpO2 98%   BMI 27.91 kg/m   Physical Exam Vitals and nursing note reviewed.   64 year old male, resting comfortably and in no acute distress. Vital signs are significant for elevated blood pressure. Oxygen saturation is 98%, which is normal. Head is normocephalic and atraumatic. PERRLA, EOMI. Oropharynx is clear. Neck is nontender and  supple without adenopathy or JVD. Back is nontender and there is no CVA tenderness. Lungs are clear without rales, wheezes, or rhonchi. Chest is nontender. Heart has regular rate and rhythm without murmur. Abdomen is soft, flat, with mild tenderness across the mid abdomen and moderate tenderness in the suprapubic area.  There is no rebound or guarding.  There are no masses or hepatosplenomegaly and peristalsis is hypoactive. Extremities have no cyanosis or edema, full range of motion is present. Skin is warm and dry without rash. Neurologic: Mental status is normal, cranial nerves are intact, there are no motor or sensory deficits.  ED Results / Procedures / Treatments   Labs (all labs ordered are listed, but only abnormal results are displayed) Labs Reviewed  CBC WITH DIFFERENTIAL/PLATELET - Abnormal; Notable for the following components:      Result Value   Neutro Abs 8.4 (*)    All other components within normal limits  URINALYSIS, ROUTINE W REFLEX MICROSCOPIC - Abnormal; Notable for the following components:   Glucose, UA 250 (*)    All other components within normal limits  COMPREHENSIVE METABOLIC PANEL  LIPASE, BLOOD   Radiology No results found.  Procedures Procedures   Medications Ordered in ED Medications - No data to display  ED Course  I have reviewed the triage vital signs and the nursing notes.  Pertinent labs & imaging results that were available during my care of the patient were reviewed by me and considered in my medical decision making (see chart for details).  MDM Rules/Calculators/A&P Suprapubic pain, cause unclear.  Consider urinary tract infection, diverticulitis, appendicitis, urolithiasis.  Screening labs were obtained and he will be sent for CT of abdomen and pelvis.  Old records are reviewed, and he has no relevant past visits.  There has been no prior abdominal imaging.  CBC is normal, urinalysis shows no evidence of infection.  Comprehensive  metabolic panel and lipase are pending.  CT of abdomen and pelvis is pending Case is signed out to Dr. Johnney Killian, oncoming physician.  Final Clinical Impression(s) / ED Diagnoses Final diagnoses:  Lower abdominal pain    Rx / DC Orders ED Discharge Orders    None       Delora Fuel, MD 69/48/54 [phone]

## 2019-10-01 NOTE — Discharge Instructions (Signed)
1.  Start taking Flagyl as prescribed at dinnertime.  Start your ciprofloxacin tomorrow morning.  You may take 1-2 Percocet every 6 hours if needed for pain control.  If you have any tendency to get constipation, start taking a stool softener such as Colace twice daily while you are taking Percocet. 2.  Return to the emergency department if you develop a fever, worsening pain or other concerning symptoms.  Have a recheck with your doctor within the next 2 to 4 days.

## 2019-10-01 NOTE — ED Triage Notes (Signed)
Pt reports lower mid abd pain for 3 days, states constantly. States he has been passing gas and having normal bowel movements. Denies urinary symptoms.

## 2019-10-01 NOTE — ED Provider Notes (Signed)
CT scan is consistent with diverticulitis. Physical Exam  BP (!) 144/102 (BP Location: Right Arm)   Pulse 81   Temp 98.8 F (37.1 C) (Oral)   Resp 18   Ht 5\' 9"  (1.753 m)   Wt 85.7 kg   SpO2 98%   BMI 27.91 kg/m   Physical Exam Constitutional:      Comments: Alert and appropriate with normal mental status.  Pulmonary:     Effort: Pulmonary effort is normal.  Abdominal:     Comments: Abdomen is soft patient does have significant reproducible left lower quadrant and suprapubic pain.  He is not guarding.     ED Course/Procedures     Procedures  MDM  Patient has significant persisting pain.  He is given morphine 4 mg for pain control.  Diverticulitis identified on CT scan.  Patient was given IV dose of Levaquin and Flagyl.  No lactic acidosis.  At this time review of diverticulitis and return precautions done.  Patient is aware of follow-up plan and outpatient management.       Charlesetta Shanks, MD 10/01/19 1108

## 2019-10-02 ENCOUNTER — Emergency Department (HOSPITAL_BASED_OUTPATIENT_CLINIC_OR_DEPARTMENT_OTHER)
Admission: EM | Admit: 2019-10-02 | Discharge: 2019-10-02 | Disposition: A | Discharge status: Home / Self Care | Payer: 59 | Attending: Emergency Medicine | Admitting: Emergency Medicine

## 2019-10-02 ENCOUNTER — Other Ambulatory Visit: Payer: Self-pay

## 2019-10-02 ENCOUNTER — Encounter (HOSPITAL_BASED_OUTPATIENT_CLINIC_OR_DEPARTMENT_OTHER): Payer: Self-pay

## 2019-10-02 DIAGNOSIS — K5732 Diverticulitis of large intestine without perforation or abscess without bleeding: Secondary | ICD-10-CM | POA: Insufficient documentation

## 2019-10-02 DIAGNOSIS — E785 Hyperlipidemia, unspecified: Secondary | ICD-10-CM | POA: Diagnosis not present

## 2019-10-02 DIAGNOSIS — R1032 Left lower quadrant pain: Secondary | ICD-10-CM | POA: Diagnosis present

## 2019-10-02 DIAGNOSIS — E119 Type 2 diabetes mellitus without complications: Secondary | ICD-10-CM | POA: Insufficient documentation

## 2019-10-02 DIAGNOSIS — K5792 Diverticulitis of intestine, part unspecified, without perforation or abscess without bleeding: Secondary | ICD-10-CM

## 2019-10-02 LAB — COMPREHENSIVE METABOLIC PANEL
ALT: 22 U/L (ref 0–44)
AST: 18 U/L (ref 15–41)
Albumin: 3.6 g/dL (ref 3.5–5.0)
Alkaline Phosphatase: 46 U/L (ref 38–126)
Anion gap: 8 (ref 5–15)
BUN: 15 mg/dL (ref 8–23)
CO2: 20 mmol/L — ABNORMAL LOW (ref 22–32)
Calcium: 8.8 mg/dL — ABNORMAL LOW (ref 8.9–10.3)
Chloride: 104 mmol/L (ref 98–111)
Creatinine, Ser: 1.19 mg/dL (ref 0.61–1.24)
GFR calc Af Amer: >60 mL/min (ref >60)
GFR calc non Af Amer: >60 mL/min (ref >60)
Glucose, Bld: 221 mg/dL — ABNORMAL HIGH (ref 70–99)
Potassium: 4.1 mmol/L (ref 3.5–5.1)
Sodium: 132 mmol/L — ABNORMAL LOW (ref 135–145)
Total Bilirubin: 1.4 mg/dL — ABNORMAL HIGH (ref 0.3–1.2)
Total Protein: 7.2 g/dL (ref 6.5–8.1)

## 2019-10-02 LAB — CBC
HCT: 43.7 % (ref 39.0–52.0)
Hemoglobin: 14.6 g/dL (ref 13.0–17.0)
MCH: 31.1 pg (ref 26.0–34.0)
MCHC: 33.4 g/dL (ref 30.0–36.0)
MCV: 93.2 fL (ref 80.0–100.0)
Platelets: 221 10*3/uL (ref 150–400)
RBC: 4.69 MIL/uL (ref 4.22–5.81)
RDW: 11.9 % (ref 11.5–15.5)
WBC: 11.9 10*3/uL — ABNORMAL HIGH (ref 4.0–10.5)
nRBC: 0 % (ref 0.0–0.2)

## 2019-10-02 MED ORDER — ONDANSETRON 4 MG PO TBDP: 4.0000 mg | ORAL_TABLET | Freq: Three times a day (TID) | ORAL | 1 refills | Status: DC | PRN

## 2019-10-02 MED ORDER — SODIUM CHLORIDE 0.9 % IV BOLUS
1000.0000 mL | Freq: Once | INTRAVENOUS | Status: AC
  Administered 2019-10-02: 1000 mL via INTRAVENOUS

## 2019-10-02 MED ORDER — ONDANSETRON HCL 4 MG/2ML IJ SOLN
4.0000 mg | Freq: Once | INTRAMUSCULAR | Status: AC
  Administered 2019-10-02: 4 mg via INTRAVENOUS
  Filled 2019-10-02: qty 2

## 2019-10-02 MED ORDER — CIPROFLOXACIN IN D5W 400 MG/200ML IV SOLN
400.0000 mg | Freq: Once | INTRAVENOUS | Status: AC
  Administered 2019-10-02: 400 mg via INTRAVENOUS
  Filled 2019-10-02: qty 200

## 2019-10-02 MED ORDER — METRONIDAZOLE IN NACL 5-0.79 MG/ML-% IV SOLN
500.0000 mg | Freq: Once | INTRAVENOUS | Status: AC
  Administered 2019-10-02: 500 mg via INTRAVENOUS
  Filled 2019-10-02: qty 100

## 2019-10-02 NOTE — ED Notes (Signed)
Pt given crackers and soda per dr Sedonia Small

## 2019-10-02 NOTE — ED Provider Notes (Signed)
[facility] Emergency Department Provider Note MRN:  [id number]  Arrival date & time: 10/02/19     Chief Complaint   Abdominal Pain   History of Present Illness   Matthew Patterson is a 64 y.o. year-old male with a history of diabetes, hyperlipidemia presenting to the ED with chief complaint of abdominal.  Patient explains that he was seen in the emergency department yesterday and diagnosed with diverticulitis, was discharged on antibiotics.  He explains that he has had significant nausea that has prevented him from being able to take his antibiotics.  Was only able to take 1 dose of the Flagyl.  Has had 2 episodes of nonbloody nonbilious emesis but persistent nausea, not eating or drinking.  Pain in the left lower quadrant is moderate, unchanged.  Denies fevers or chills, no other complaints.  Review of Systems  A complete 10 system review of systems was obtained and all systems are negative except as noted in the HPI and PMH.   Patient's Health History    Past Medical History:  Diagnosis Date  . Anxiety and depression   . Diabetes mellitus without complication ([place])   . GERD (gastroesophageal reflux disease)   . Hyperlipidemia     Past Surgical History:  Procedure Laterality Date  . ROTATOR CUFF REPAIR    . SHOULDER SURGERY  2010   B/L    Family History  Problem Relation Age of Onset  . Diabetes Mother   . Hypertension Mother   . Cancer Sister        uterine cancer    Social History   Socioeconomic History  . Marital status: Married    Spouse name: Not on file  . Number of children: Not on file  . Years of education: Not on file  . Highest education level: Not on file  Occupational History  . Not on file  Tobacco Use  . Smoking status: Never Smoker  . Smokeless tobacco: Never Used  Substance and Sexual Activity  . Alcohol use: Yes    Alcohol/week: 1.0 - 2.0 standard drinks    Types: 1 - 2 Cans of beer per week  . Drug use: No  .  Sexual activity: Not on file  Other Topics Concern  . Not on file  Social History Narrative   Scientist, 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    One grandchild      He likes to play golf    Social Determinants of Health   Financial Resource Strain:   . Difficulty of Paying Living Expenses: Not on file  Food Insecurity:   . Worried About Charity fundraiser in the Last Year: Not on file  . Ran Out of Food in the Last Year: Not on file  Transportation Needs:   . Lack of Transportation (Medical): Not on file  . Lack of Transportation (Non-Medical): Not on file  Physical Activity:   . Days of Exercise per Week: Not on file  . Minutes of Exercise per Session: Not on file  Stress:   . Feeling of Stress : Not on file  Social Connections:   . Frequency of Communication with Friends and Family: Not on file  . Frequency of Social Gatherings with Friends and Family: Not on file  . Attends Religious Services: Not on file  . Active Member of Clubs or Organizations: Not on file  . Attends Archivist Meetings: Not on file  . Marital  Status: Not on file  Intimate Partner Violence:   . Fear of Current or Ex-Partner: Not on file  . Emotionally Abused: Not on file  . Physically Abused: Not on file  . Sexually Abused: Not on file     Physical Exam   Vitals:   10/02/19 0914 10/02/19 1335  BP: (!) 142/97 112/73  Pulse: 80 76  Resp: 18   Temp: 99.1 F (37.3 C)   SpO2: 98% 98%    CONSTITUTIONAL: Well-appearing, NAD NEURO:  Alert and oriented x 3, no focal deficits EYES:  eyes equal and reactive ENT/NECK:  no LAD, no JVD CARDIO: Regular rate, well-perfused, normal S1 and S2 PULM:  CTAB no wheezing or rhonchi GI/GU:  normal bowel sounds, non-distended, moderate left lower quadrant tenderness to palpation MSK/SPINE:  No gross deformities, no edema SKIN:  no rash, atraumatic PSYCH:  Appropriate speech and behavior  *Additional and/or pertinent findings included in  MDM below  Diagnostic and Interventional Summary    EKG Interpretation  Date/Time:    Ventricular Rate:    PR Interval:    QRS Duration:   QT Interval:    QTC Calculation:   R Axis:     Text Interpretation:        Cardiac Monitoring Interpretation:  Labs Reviewed  CBC - Abnormal; Notable for the following components:      Result Value   WBC 11.9 (*)    All other components within normal limits  COMPREHENSIVE METABOLIC PANEL - Abnormal; Notable for the following components:   Sodium 132 (*)    CO2 20 (*)    Glucose, Bld 221 (*)    Calcium 8.8 (*)    Total Bilirubin 1.4 (*)    All other components within normal limits    No orders to display    Medications  sodium chloride 0.9 % bolus 1,000 mL (0 mLs Intravenous Stopped 10/02/19 1336)  ondansetron (ZOFRAN) injection 4 mg (4 mg Intravenous Given 10/02/19 0959)  ciprofloxacin (CIPRO) IVPB 400 mg (0 mg Intravenous Stopped 10/02/19 1117)  metroNIDAZOLE (FLAGYL) IVPB 500 mg (0 mg Intravenous Stopped 10/02/19 1300)     Procedures  /  Critical Care Procedures  ED Course and Medical Decision Making  I have reviewed the triage vital signs, the nursing notes, and pertinent available records from the EMR.  Pertinent labs & imaging results that were available during my care of the patient were reviewed by me and considered in my medical decision making (see below for details).     P.o. intolerance in this 64 year old male with known diverticulitis, will provide antinausea medication, fluids, IV antibiotics, attempt p.o. challenge and discharge but may need admission based on his symptoms.  1:38 PM update: Feeling better, tolerating food and drink without any vomiting here in the emergency department.  Labs overall reassuring, rehydrated here with IV fluids, IV antibiotics.  No peritonitis, nothing to suggest worsening diverticulitis or abscess or perforation.  Options discussed with patient regarding admission versus discharge on  antinausea medications and patient elected to be discharged with strict return precautions.  Barth Kirks. Sedonia Small, [place] [email or web address]  Final Clinical Impressions(s) / ED Diagnoses     ICD-10-CM   1. Diverticulitis  K57.92     ED Discharge Orders         Ordered    ondansetron (ZOFRAN ODT) 4 MG disintegrating tablet  Every 8 hours PRN     10/02/19 1333  Discharge Instructions Discussed with and Provided to Patient:     Discharge Instructions     You were evaluated in the Emergency Department and after careful evaluation, we did not find any emergent condition requiring admission or further testing in the hospital.  Your exam/testing today is overall reassuring.  Your symptoms are still explained by the diverticulitis.  Please use the nausea medicine at home so that you can drink fluids and take your antibiotics.  Please return to the Emergency Department if you experience any worsening of your condition.  We encourage you to follow up with a primary care provider.  Thank you for allowing [place] to be a part of your care.       Maudie Flakes, MD 10/02/19 [phone]

## 2019-10-02 NOTE — Discharge Instructions (Addendum)
You were evaluated in the Emergency Department and after careful evaluation, we did not find any emergent condition requiring admission or further testing in the hospital.  Your exam/testing today is overall reassuring.  Your symptoms are still explained by the diverticulitis.  Please use the nausea medicine at home so that you can drink fluids and take your antibiotics.  Please return to the Emergency Department if you experience any worsening of your condition.  We encourage you to follow up with a primary care provider.  Thank you for allowing [place] to be a part of your care.

## 2019-10-02 NOTE — ED Triage Notes (Signed)
Pt arrives with reports of being seen yesterday states he was dx with diverticulitis, states he was sent home but is unable to keep anything down, thinks he needs to stay in the hospital.

## 2019-10-04 ENCOUNTER — Other Ambulatory Visit: Payer: Self-pay

## 2019-10-05 ENCOUNTER — Encounter: Payer: Self-pay | Admitting: Adult Health

## 2019-10-05 ENCOUNTER — Ambulatory Visit (INDEPENDENT_AMBULATORY_CARE_PROVIDER_SITE_OTHER): Payer: 59 | Admitting: Adult Health

## 2019-10-05 VITALS — BP 120/84 | Temp 97.3°F | Wt 188.0 lb

## 2019-10-05 DIAGNOSIS — R112 Nausea with vomiting, unspecified: Secondary | ICD-10-CM | POA: Diagnosis not present

## 2019-10-05 DIAGNOSIS — K5792 Diverticulitis of intestine, part unspecified, without perforation or abscess without bleeding: Secondary | ICD-10-CM | POA: Diagnosis not present

## 2019-10-05 NOTE — Progress Notes (Signed)
Subjective:    Patient ID: Matthew Patterson, male    DOB: 11-Oct-1955, 64 y.o.   MRN: [id number]  HPI 64 year old male who  has a past medical history of Anxiety and depression, Diabetes mellitus without complication ([place]), GERD (gastroesophageal reflux disease), and Hyperlipidemia.  He presents to the clinic today for follow up after two recent ER visits.   He was originally seen in the emergency room on 10/01/2019 for left lower abdominal pain.  In the ER he was diagnosed with diverticulitis and prescribed 10-day course of Cipro and Flagyl as well as Percocet for her pain management.  He was then seen 1 day later back in the emergency room for significant nausea that prevented him from taking his antibiotics.  It was noted that he was able to take 1 dose of Flagyl but had 2 episodes of nonbloody nonbilious emesis afterwards and persistent nausea.  He was not eating or drinking at this time.  They provided IV fluids, IV antibiotics and antinausea medicines, he was feeling better and was tolerating food and drink without any vomiting so he was discharged home.  Today he reports that he is feeling slightly better but continues to be nauseous and had an episode of vomiting this morning after he had taken his medication.  He reports that there was no formed pill in the vomit this morning.  He continues to have decreased appetite, but is trying to stay hydrated.  Denies fevers or chills.  He has not picked up his prescription for Zofran yet    Review of Systems See HPI   Past Medical History:  Diagnosis Date  . Anxiety and depression   . Diabetes mellitus without complication ([place])   . GERD (gastroesophageal reflux disease)   . Hyperlipidemia     Social History   Socioeconomic History  . Marital status: Married    Spouse name: Not on file  . Number of children: Not on file  . Years of education: Not on file  . Highest education level: Not on file  Occupational History  . Not on file    Tobacco Use  . Smoking status: Never Smoker  . Smokeless tobacco: Never Used  Substance and Sexual Activity  . Alcohol use: Yes    Alcohol/week: 1.0 - 2.0 standard drinks    Types: 1 - 2 Cans of beer per week  . Drug use: No  . Sexual activity: Not on file  Other Topics Concern  . Not on file  Social History Narrative   Scientist, 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    One grandchild      He likes to play golf    Social Determinants of Health   Financial Resource Strain:   . Difficulty of Paying Living Expenses: Not on file  Food Insecurity:   . Worried About Charity fundraiser in the Last Year: Not on file  . Ran Out of Food in the Last Year: Not on file  Transportation Needs:   . Lack of Transportation (Medical): Not on file  . Lack of Transportation (Non-Medical): Not on file  Physical Activity:   . Days of Exercise per Week: Not on file  . Minutes of Exercise per Session: Not on file  Stress:   . Feeling of Stress : Not on file  Social Connections:   . Frequency of Communication with Friends and Family: Not on file  . Frequency of Social Gatherings with Friends  and Family: Not on file  . Attends Religious Services: Not on file  . Active Member of Clubs or Organizations: Not on file  . Attends Archivist Meetings: Not on file  . Marital Status: Not on file  Intimate Partner Violence:   . Fear of Current or Ex-Partner: Not on file  . Emotionally Abused: Not on file  . Physically Abused: Not on file  . Sexually Abused: Not on file    Past Surgical History:  Procedure Laterality Date  . ROTATOR CUFF REPAIR    . SHOULDER SURGERY  2010   B/L    Family History  Problem Relation Age of Onset  . Diabetes Mother   . Hypertension Mother   . Cancer Sister        uterine cancer    No Known Allergies  Current Outpatient Medications on File Prior to Visit  Medication Sig Dispense Refill  . atorvastatin (LIPITOR) 20 MG tablet Take 1 tablet  (20 mg total) by mouth daily. 90 tablet 3  . Blood Glucose Monitoring Suppl (ONETOUCH VERIO IQ SYSTEM) w/Device KIT 1 kit 1 kit 0  . ciprofloxacin (CIPRO) 500 MG tablet Take 1 tablet (500 mg total) by mouth 2 (two) times daily. One po bid x 7 days.  Start time 3\1\2020 14 tablet 0  . fenofibrate (TRICOR) 145 MG tablet TAKE 1 TABLET BY MOUTH EVERY DAY 90 tablet 0  . glucose blood test strip Use as instructed 100 each 12  . Lancets (ONETOUCH ULTRASOFT) lancets Use as instructed 100 each 12  . lisinopril (ZESTRIL) 2.5 MG tablet TAKE 1 TABLET BY MOUTH EVERY DAY 90 tablet 1  . metFORMIN (GLUCOPHAGE) 500 MG tablet TAKE 1 TABLET (500 MG TOTAL) BY MOUTH 2 (TWO) TIMES DAILY WITH A MEAL. (Patient taking differently: Take 500 mg by mouth daily with breakfast. ) 180 tablet 0  . metroNIDAZOLE (FLAGYL) 500 MG tablet Take 1 tablet (500 mg total) by mouth 3 (three) times daily. One po bid x 7 days.  Start [phone]  in the evening. 21 tablet 0  . omeprazole (PRILOSEC) 20 MG capsule TAKE ONE CAPSULE BY MOUTH TWICE DAILY FOR THREE WEEKS THEN ONE CAPSULE BEFORE EVENING MEAL 90 capsule 0  . oxyCODONE-acetaminophen (PERCOCET) 5-325 MG tablet Take 1-2 tablets by mouth every 6 (six) hours as needed. 20 tablet 0  . traZODone (DESYREL) 50 MG tablet TAKE 1/2 TO 1 TABLET BY MOUTH EVERY DAY AT BEDTIME AS NEEDED FOR SLEEP 90 tablet 0  . ondansetron (ZOFRAN ODT) 4 MG disintegrating tablet Take 1 tablet (4 mg total) by mouth every 8 (eight) hours as needed for nausea or vomiting. (Patient not taking: Reported on 10/05/2019) 20 tablet 1   No current facility-administered medications on file prior to visit.    BP 120/84   Temp (!) 97.3 F (36.3 C) (Temporal)   Wt 188 lb (85.3 kg)   BMI 27.76 kg/m       Objective:   Physical Exam Vitals and nursing note reviewed.  Constitutional:      Appearance: Normal appearance.  Cardiovascular:     Rate and Rhythm: Normal rate and regular rhythm.     Pulses: Normal pulses.      Heart sounds: Normal heart sounds.  Pulmonary:     Effort: Pulmonary effort is normal.     Breath sounds: Normal breath sounds.  Abdominal:     General: Abdomen is flat. Bowel sounds are normal.     Palpations: Abdomen is soft.  Tenderness: There is abdominal tenderness (throughout abdomen but worse in LLQ).  Musculoskeletal:        General: Normal range of motion.  Skin:    General: Skin is warm and dry.     Capillary Refill: Capillary refill takes less than 2 seconds.  Neurological:     General: No focal deficit present.     Mental Status: He is alert and oriented to person, place, and time.  Psychiatric:        Mood and Affect: Mood normal.        Behavior: Behavior normal.        Thought Content: Thought content normal.        Judgment: Judgment normal.        Assessment & Plan:  He was encouraged to pick up prescription for Zofran and take this 30 minutes before taking any medications.  He can drink sugar-free Gatorade to help stay hydrated.  Small bland meals for the next 48 hours. Follow-up if symptoms do not resolve  Dorothyann Peng, NP

## 2019-10-05 NOTE — Progress Notes (Signed)
   Subjective:    Patient ID: Matthew Patterson, male    DOB: 11-Nov-1955, 64 y.o.   MRN: [id number]  HPI    Review of Systems     Objective:   Physical Exam        Assessment & Plan:

## 2019-10-28 ENCOUNTER — Ambulatory Visit: Payer: 59 | Attending: Internal Medicine

## 2019-10-28 DIAGNOSIS — Z23 Encounter for immunization: Secondary | ICD-10-CM

## 2019-10-28 NOTE — Progress Notes (Signed)
   Covid-19 Vaccination Clinic  Name:  Matthew Patterson    MRN: [id number] DOB: 1955-10-30  10/28/2019  [name] was observed post Covid-19 immunization for 15 minutes without incident. He was provided with Vaccine Information Sheet and instruction to access the V-Safe system.   [name] was instructed to call 911 with any severe reactions post vaccine: [place] Kitchen Difficulty breathing  . Swelling of face and throat  . A fast heartbeat  . A bad rash all over body  . Dizziness and weakness   Immunizations Administered    Name Date Dose VIS Date Route   Pfizer COVID-19 Vaccine 10/28/2019  3:30 PM 0.3 mL 07/14/2019 Intramuscular   Manufacturer: [place]   Lot: [id number]   [place]: [id number]

## 2019-11-18 ENCOUNTER — Other Ambulatory Visit: Payer: Self-pay | Admitting: Adult Health

## 2019-11-20 ENCOUNTER — Ambulatory Visit: Payer: 59 | Attending: Internal Medicine

## 2019-11-20 DIAGNOSIS — Z23 Encounter for immunization: Secondary | ICD-10-CM

## 2019-11-20 NOTE — Progress Notes (Signed)
   Covid-19 Vaccination Clinic  Name:  Matthew Patterson    MRN: [id number] DOB: 10/23/1955  11/20/2019  [name] was observed post Covid-19 immunization for 15 minutes without incident. He was provided with Vaccine Information Sheet and instruction to access the V-Safe system.   [name] was instructed to call 911 with any severe reactions post vaccine: [place] Kitchen Difficulty breathing  . Swelling of face and throat  . A fast heartbeat  . A bad rash all over body  . Dizziness and weakness   Immunizations Administered    Name Date Dose VIS Date Route   Pfizer COVID-19 Vaccine 11/20/2019  4:34 PM 0.3 mL 09/27/2018 Intramuscular   Manufacturer: [place]   Lot: [id number]   [place]: [id number]

## 2019-11-27 ENCOUNTER — Ambulatory Visit: Payer: 59

## 2019-12-07 ENCOUNTER — Other Ambulatory Visit: Payer: Self-pay | Admitting: Adult Health

## 2019-12-14 ENCOUNTER — Other Ambulatory Visit: Payer: Self-pay

## 2019-12-15 ENCOUNTER — Encounter: Payer: Self-pay | Admitting: Adult Health

## 2019-12-15 ENCOUNTER — Other Ambulatory Visit: Payer: Self-pay | Admitting: Family Medicine

## 2019-12-15 ENCOUNTER — Telehealth: Payer: Self-pay | Admitting: Adult Health

## 2019-12-15 ENCOUNTER — Ambulatory Visit (INDEPENDENT_AMBULATORY_CARE_PROVIDER_SITE_OTHER): Payer: 59 | Admitting: Adult Health

## 2019-12-15 ENCOUNTER — Other Ambulatory Visit: Payer: Self-pay | Admitting: Adult Health

## 2019-12-15 VITALS — BP 120/80 | Temp 97.7°F | Ht 69.0 in | Wt 189.0 lb

## 2019-12-15 DIAGNOSIS — E118 Type 2 diabetes mellitus with unspecified complications: Secondary | ICD-10-CM | POA: Diagnosis not present

## 2019-12-15 DIAGNOSIS — Z125 Encounter for screening for malignant neoplasm of prostate: Secondary | ICD-10-CM | POA: Diagnosis not present

## 2019-12-15 DIAGNOSIS — E781 Pure hyperglyceridemia: Secondary | ICD-10-CM | POA: Diagnosis not present

## 2019-12-15 DIAGNOSIS — Z0000 Encounter for general adult medical examination without abnormal findings: Secondary | ICD-10-CM | POA: Diagnosis not present

## 2019-12-15 DIAGNOSIS — G4700 Insomnia, unspecified: Secondary | ICD-10-CM | POA: Diagnosis not present

## 2019-12-15 LAB — HEMOGLOBIN A1C: Hgb A1c MFr Bld: 7.6 % — ABNORMAL HIGH (ref 4.6–6.5)

## 2019-12-15 LAB — LIPID PANEL
Cholesterol: 186 mg/dL (ref 0–200)
HDL: 47.6 mg/dL (ref >39.00)
LDL Cholesterol: 111 mg/dL — ABNORMAL HIGH (ref 0–99)
NonHDL: 138.61
Total CHOL/HDL Ratio: 4
Triglycerides: 140 mg/dL (ref 0.0–149.0)
VLDL: 28 mg/dL (ref 0.0–40.0)

## 2019-12-15 LAB — COMPREHENSIVE METABOLIC PANEL
ALT: 19 U/L (ref 0–53)
AST: 16 U/L (ref 0–37)
Albumin: 4.4 g/dL (ref 3.5–5.2)
Alkaline Phosphatase: 52 U/L (ref 39–117)
BUN: 18 mg/dL (ref 6–23)
CO2: 28 mEq/L (ref 19–32)
Calcium: 8.9 mg/dL (ref 8.4–10.5)
Chloride: 102 mEq/L (ref 96–112)
Creatinine, Ser: 1.07 mg/dL (ref 0.40–1.50)
GFR: 69.56 mL/min (ref >60.00)
Glucose, Bld: 177 mg/dL — ABNORMAL HIGH (ref 70–99)
Potassium: 4.2 mEq/L (ref 3.5–5.1)
Sodium: 136 mEq/L (ref 135–145)
Total Bilirubin: 0.8 mg/dL (ref 0.2–1.2)
Total Protein: 6.7 g/dL (ref 6.0–8.3)

## 2019-12-15 LAB — CBC WITH DIFFERENTIAL/PLATELET
Basophils Absolute: 0 10*3/uL (ref 0.0–0.1)
Basophils Relative: 0.7 % (ref 0.0–3.0)
Eosinophils Absolute: 0.1 10*3/uL (ref 0.0–0.7)
Eosinophils Relative: 1.5 % (ref 0.0–5.0)
HCT: 42.2 % (ref 39.0–52.0)
Hemoglobin: 14.7 g/dL (ref 13.0–17.0)
Lymphocytes Relative: 21.9 % (ref 12.0–46.0)
Lymphs Abs: 1.4 10*3/uL (ref 0.7–4.0)
MCHC: 34.8 g/dL (ref 30.0–36.0)
MCV: 93.1 fl (ref 78.0–100.0)
Monocytes Absolute: 0.4 10*3/uL (ref 0.1–1.0)
Monocytes Relative: 6 % (ref 3.0–12.0)
Neutro Abs: 4.5 10*3/uL (ref 1.4–7.7)
Neutrophils Relative %: 69.9 % (ref 43.0–77.0)
Platelets: 237 10*3/uL (ref 150.0–400.0)
RBC: 4.53 Mil/uL (ref 4.22–5.81)
RDW: 13.7 % (ref 11.5–15.5)
WBC: 6.4 10*3/uL (ref 4.0–10.5)

## 2019-12-15 LAB — TSH: TSH: 2.07 u[IU]/mL (ref 0.35–4.50)

## 2019-12-15 LAB — PSA: PSA: 0.6 ng/mL (ref 0.10–4.00)

## 2019-12-15 MED ORDER — METFORMIN HCL 500 MG PO TABS: 500.0000 mg | ORAL_TABLET | Freq: Two times a day (BID) | ORAL | 0 refills | Status: DC

## 2019-12-15 MED ORDER — CIPROFLOXACIN HCL 500 MG PO TABS: 500.0000 mg | ORAL_TABLET | Freq: Two times a day (BID) | ORAL | 0 refills | Status: DC

## 2019-12-15 MED ORDER — LISINOPRIL 2.5 MG PO TABS: 2.5000 mg | ORAL_TABLET | Freq: Every day | ORAL | 3 refills | Status: DC

## 2019-12-15 MED ORDER — METRONIDAZOLE 500 MG PO TABS: 500.0000 mg | ORAL_TABLET | Freq: Three times a day (TID) | ORAL | 0 refills | Status: DC

## 2019-12-15 MED ORDER — FENOFIBRATE 145 MG PO TABS: 145.0000 mg | ORAL_TABLET | Freq: Every day | ORAL | 3 refills | Status: DC

## 2019-12-15 NOTE — Telephone Encounter (Signed)
[facility], calling to get clarification on Metronidazole 500 mg tablet. Thanks  [facility] [phone]

## 2019-12-15 NOTE — Telephone Encounter (Signed)
Cory, there are 2 different directions on the prescription.  Which is correct?

## 2019-12-15 NOTE — Telephone Encounter (Signed)
Sent in new prescription.

## 2019-12-15 NOTE — Progress Notes (Addendum)
Subjective:    Patient ID: Matthew Patterson, male    DOB: October 27, 1955, 64 y.o.   MRN: [id number]  HPI Patient presents for yearly preventative medicine examination.  He is a pleasant 64 year old male who  has a past medical history of Anxiety and depression, Diabetes mellitus without complication ([place]), GERD (gastroesophageal reflux disease), and Hyperlipidemia.  DM II -currently controlled with Metformin 500 mg daily.  His most recent A1c in July 2020 was 6.6.  He does  monitor his blood sugars at home on a regular basis and has not had any readings above 150, he denies any symptoms of hypoglycemia.  Does try and eat a heart healthy diet and exercises on a routine basis.  He takes lisinopril 2.5 mg for kidney protection. Lab Results  Component Value Date   HGBA1C 6.6 (H) 02/06/2019   Hyperlipidemia-currently maintained with Lipitor 20 mg and TriCor.  He denies myalgia or fatigue Lab Results  Component Value Date   CHOL 174 02/06/2019   HDL 47.40 02/06/2019   LDLCALC 93 02/06/2019   LDLDIRECT 167.0 09/30/2018   TRIG 171.0 (H) 02/06/2019   CHOLHDL 4 02/06/2019    Insomnia - takes trazodone PRN   All immunizations and health maintenance protocols were reviewed with the patient and needed orders were placed.  Appropriate screening laboratory values were ordered for the patient including screening of hyperlipidemia, renal function and hepatic function.   Medication reconciliation,  past medical history, social history, problem list and allergies were reviewed in detail with the patient  Goals were established with regard to weight loss, exercise, and  diet in compliance with medications  Wt Readings from Last 3 Encounters:  12/15/19 189 lb (85.7 kg)  10/05/19 188 lb (85.3 kg)  10/02/19 189 lb (85.7 kg)   He is due for a repeat colonoscopy this month. Has not had an eye exam   He has no acute complaints.   Review of Systems  Constitutional: Negative.   HENT: Negative.   Eyes:  Negative.   Respiratory: Negative.   Cardiovascular: Negative.   Gastrointestinal: Negative.   Endocrine: Negative.   Genitourinary: Negative.   Musculoskeletal: Negative.   Skin: Negative.   Allergic/Immunologic: Negative.   Neurological: Negative.   Hematological: Negative.   Psychiatric/Behavioral: Negative.   All other systems reviewed and are negative.  Past Medical History:  Diagnosis Date  . Anxiety and depression   . Diabetes mellitus without complication ([place])   . GERD (gastroesophageal reflux disease)   . Hyperlipidemia     Social History   Socioeconomic History  . Marital status: Married    Spouse name: Not on file  . Number of children: Not on file  . Years of education: Not on file  . Highest education level: Not on file  Occupational History  . Not on file  Tobacco Use  . Smoking status: Never Smoker  . Smokeless tobacco: Never Used  Substance and Sexual Activity  . Alcohol use: Yes    Alcohol/week: 1.0 - 2.0 standard drinks    Types: 1 - 2 Cans of beer per week  . Drug use: No  . Sexual activity: Not on file  Other Topics Concern  . Not on file  Social History Narrative   Scientist, 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    One grandchild      He likes to play golf    Social Determinants of Radio broadcast assistant Strain:   .  Difficulty of Paying Living Expenses:   Food Insecurity:   . Worried About Charity fundraiser in the Last Year:   . Arboriculturist in the Last Year:   Transportation Needs:   . Film/video editor (Medical):   Marland Kitchen Lack of Transportation (Non-Medical):   Physical Activity:   . Days of Exercise per Week:   . Minutes of Exercise per Session:   Stress:   . Feeling of Stress :   Social Connections:   . Frequency of Communication with Friends and Family:   . Frequency of Social Gatherings with Friends and Family:   . Attends Religious Services:   . Active Member of Clubs or Organizations:   . Attends  Archivist Meetings:   Marland Kitchen Marital Status:   Intimate Partner Violence:   . Fear of Current or Ex-Partner:   . Emotionally Abused:   Marland Kitchen Physically Abused:   . Sexually Abused:     Past Surgical History:  Procedure Laterality Date  . ROTATOR CUFF REPAIR    . SHOULDER SURGERY  2010   B/L    Family History  Problem Relation Age of Onset  . Diabetes Mother   . Hypertension Mother   . Cancer Sister        uterine cancer    No Known Allergies  Current Outpatient Medications on File Prior to Visit  Medication Sig Dispense Refill  . atorvastatin (LIPITOR) 20 MG tablet TAKE 1 TABLET BY MOUTH EVERY DAY 90 tablet 3  . Blood Glucose Monitoring Suppl (ONETOUCH VERIO IQ SYSTEM) w/Device KIT 1 kit 1 kit 0  . fenofibrate (TRICOR) 145 MG tablet TAKE 1 TABLET BY MOUTH EVERY DAY 90 tablet 0  . glucose blood test strip Use as instructed 100 each 12  . Lancets (ONETOUCH ULTRASOFT) lancets Use as instructed 100 each 12  . lisinopril (ZESTRIL) 2.5 MG tablet TAKE 1 TABLET BY MOUTH EVERY DAY 90 tablet 1  . metFORMIN (GLUCOPHAGE) 500 MG tablet TAKE 1 TABLET (500 MG TOTAL) BY MOUTH 2 (TWO) TIMES DAILY WITH A MEAL. (Patient taking differently: Take 500 mg by mouth daily with breakfast. ) 180 tablet 0  . omeprazole (PRILOSEC) 20 MG capsule TAKE ONE CAPSULE BY MOUTH TWICE DAILY FOR THREE WEEKS THEN ONE CAPSULE BEFORE EVENING MEAL 90 capsule 0  . traZODone (DESYREL) 50 MG tablet TAKE 1/2 TO 1 TABLET BY MOUTH EVERY DAY AT BEDTIME AS NEEDED FOR SLEEP 90 tablet 0   No current facility-administered medications on file prior to visit.    BP 120/80   Temp 97.7 F (36.5 C)   Ht '5\' 9"'  (1.753 m)   Wt 189 lb (85.7 kg)   BMI 27.91 kg/m       Objective:   Physical Exam Vitals and nursing note reviewed.  Constitutional:      General: He is not in acute distress.    Appearance: Normal appearance. He is well-developed and normal weight.  HENT:     Head: Normocephalic and atraumatic.     Right  Ear: Tympanic membrane, ear canal and external ear normal. There is no impacted cerumen.     Left Ear: Tympanic membrane, ear canal and external ear normal. There is no impacted cerumen.     Nose: Nose normal. No congestion or rhinorrhea.     Mouth/Throat:     Mouth: Mucous membranes are moist.     Pharynx: Oropharynx is clear. No oropharyngeal exudate or posterior oropharyngeal erythema.  Eyes:  General:        Right eye: No discharge.        Left eye: No discharge.     Extraocular Movements: Extraocular movements intact.     Conjunctiva/sclera: Conjunctivae normal.     Pupils: Pupils are equal, round, and reactive to light.  Neck:     Vascular: No carotid bruit.     Trachea: No tracheal deviation.  Cardiovascular:     Rate and Rhythm: Normal rate and regular rhythm.     Pulses: Normal pulses.     Heart sounds: Normal heart sounds. No murmur. No friction rub. No gallop.   Pulmonary:     Effort: Pulmonary effort is normal. No respiratory distress.     Breath sounds: Normal breath sounds. No stridor. No wheezing, rhonchi or rales.  Chest:     Chest wall: No tenderness.  Abdominal:     General: Bowel sounds are normal. There is no distension.     Palpations: Abdomen is soft. There is no mass.     Tenderness: There is no abdominal tenderness. There is no right CVA tenderness, left CVA tenderness, guarding or rebound.     Hernia: No hernia is present.  Musculoskeletal:        General: No swelling, tenderness, deformity or signs of injury. Normal range of motion.     Right lower leg: No edema.     Left lower leg: No edema.  Lymphadenopathy:     Cervical: No cervical adenopathy.  Skin:    General: Skin is warm and dry.     Capillary Refill: Capillary refill takes less than 2 seconds.     Coloration: Skin is not jaundiced or pale.     Findings: No bruising, erythema, lesion or rash.  Neurological:     General: No focal deficit present.     Mental Status: He is alert and  oriented to person, place, and time.     Cranial Nerves: No cranial nerve deficit.     Sensory: No sensory deficit.     Motor: No weakness.     Coordination: Coordination normal.     Gait: Gait normal.     Deep Tendon Reflexes: Reflexes normal.  Psychiatric:        Mood and Affect: Mood normal.        Behavior: Behavior normal.        Thought Content: Thought content normal.        Judgment: Judgment normal.       Assessment & Plan:  1. Routine general medical examination at a health care facility - Continue to work on diet and exercise - Follow up in one year or sooner if needd - CBC with Differential/Platelet - Comprehensive metabolic panel - Hemoglobin A1c - Lipid panel - TSH  2. Controlled type 2 diabetes mellitus with complication, without long-term current use of insulin (HCC) - Consider increase in metformin  - Follow up in 6 months  - CBC with Differential/Platelet - Comprehensive metabolic panel - Hemoglobin A1c - Lipid panel - TSH - lisinopril (ZESTRIL) 2.5 MG tablet; Take 1 tablet (2.5 mg total) by mouth daily.  Dispense: 90 tablet; Refill: 3  3. Insomnia, unspecified type - Continue with Trazodone as needed - CBC with Differential/Platelet - Comprehensive metabolic panel - Hemoglobin A1c - Lipid panel - TSH  4. Prostate cancer screening  - PSA  5. HYPERTRIGLYCERIDEMIA - Consider increase in statin  - CBC with Differential/Platelet - Comprehensive metabolic panel - Hemoglobin A1c -  Lipid panel - TSH - fenofibrate (TRICOR) 145 MG tablet; Take 1 tablet (145 mg total) by mouth daily.  Dispense: 90 tablet; Refill: 3   Dorothyann Peng, NP

## 2020-06-29 IMAGING — CT CT ABD-PELV W/ CM
2 of 5 series · 16 of 46 positions shown, 18 images · IV contrast (omnipaque)
Comparison: None.

CLINICAL DATA: Lower abdominal pain several days. Rule out
diverticulitis.

EXAM:
CT ABDOMEN AND PELVIS WITH CONTRAST
TECHNIQUE: Multidetector CT imaging of the abdomen and pelvis was performed
using the standard protocol following bolus administration of
intravenous contrast.
CONTRAST:  100mL OMNIPAQUE IOHEXOL 300 MG/ML  SOLN

[Series 3: axial st · axial · 0.88mm/px · z∈[-562,-46]mm · 13 of 117 slices shown, 15 images]
[im 7/117  soft-tissue]
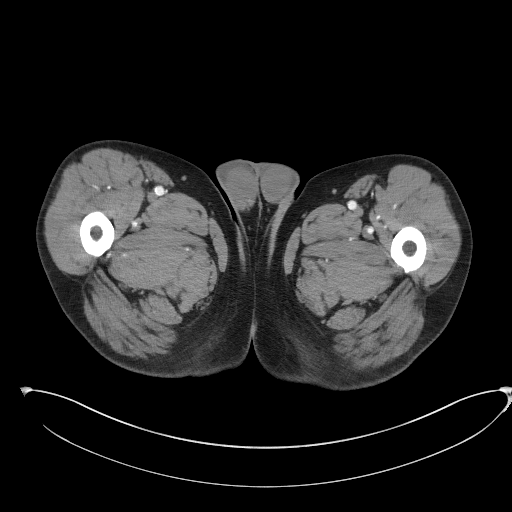
[im 7/117  bone]
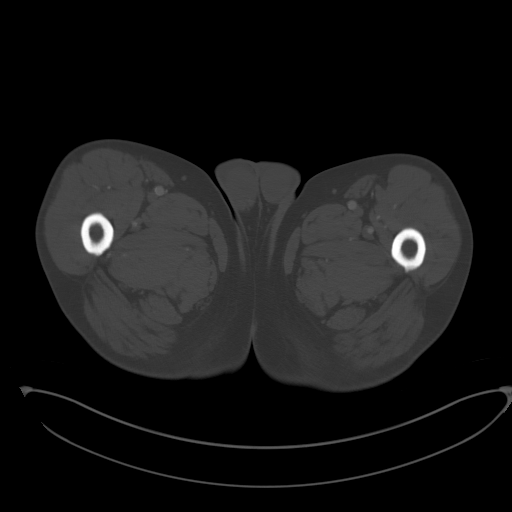
[im 19/117  soft-tissue]
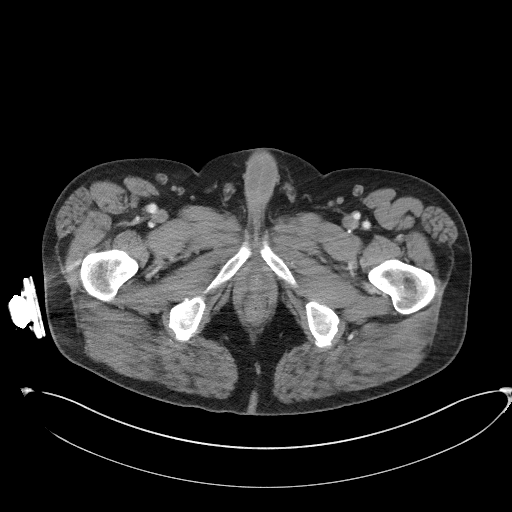
[im 25/117  soft-tissue]
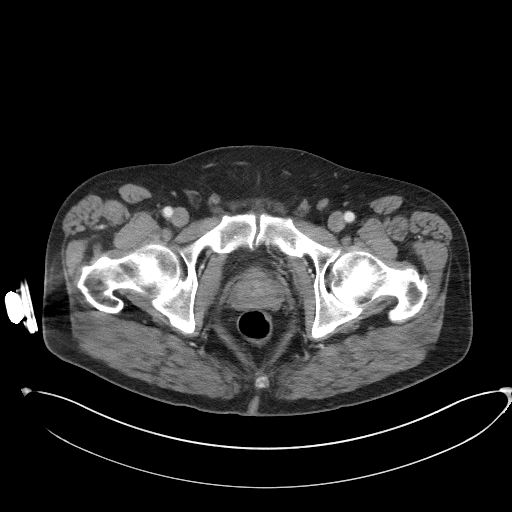
[im 31/117  soft-tissue]
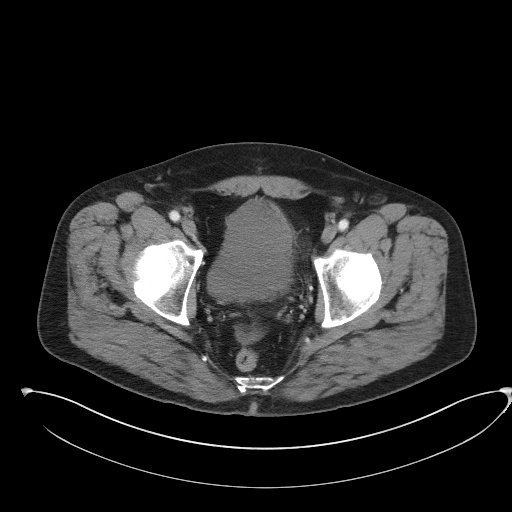
[im 43/117  soft-tissue]
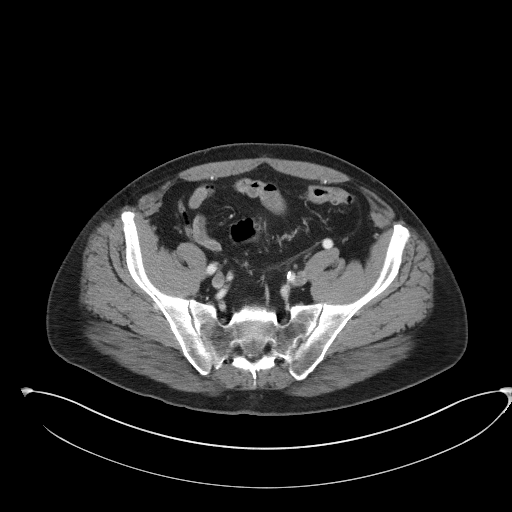
[im 49/117  soft-tissue]
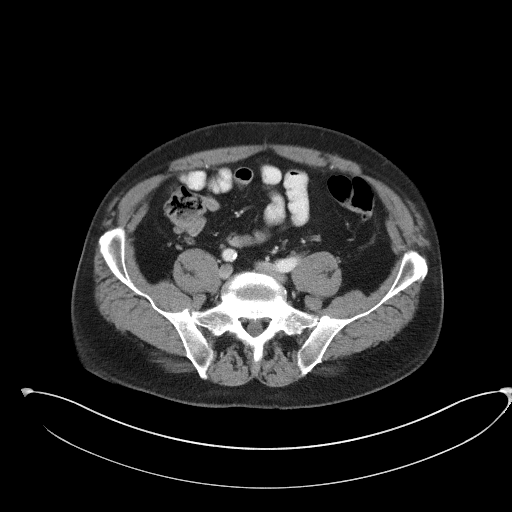
[im 62/117  soft-tissue]
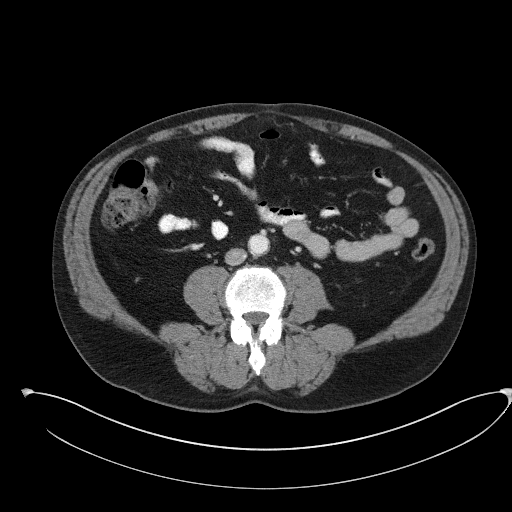
[im 68/117  soft-tissue]
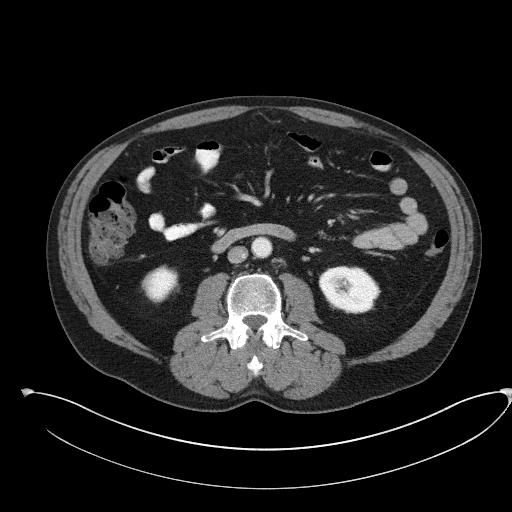
[im 74/117  soft-tissue]
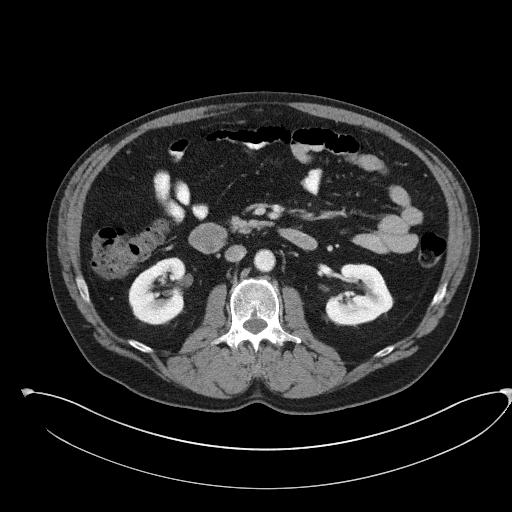
[im 74/117  bone]
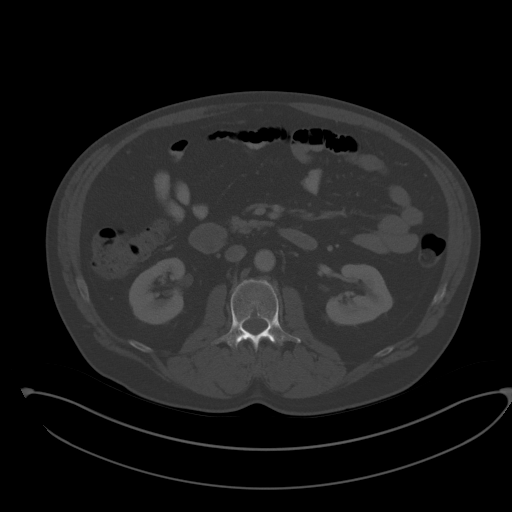
[im 86/117  soft-tissue]
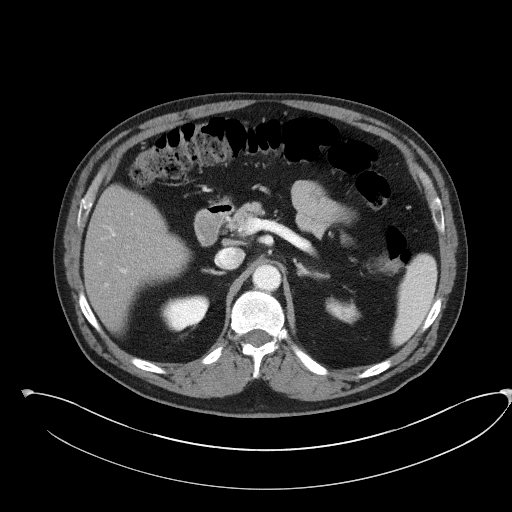
[im 92/117  soft-tissue]
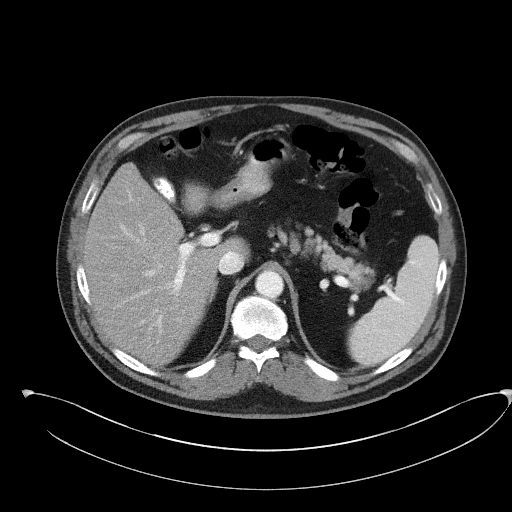
[im 98/117  soft-tissue]
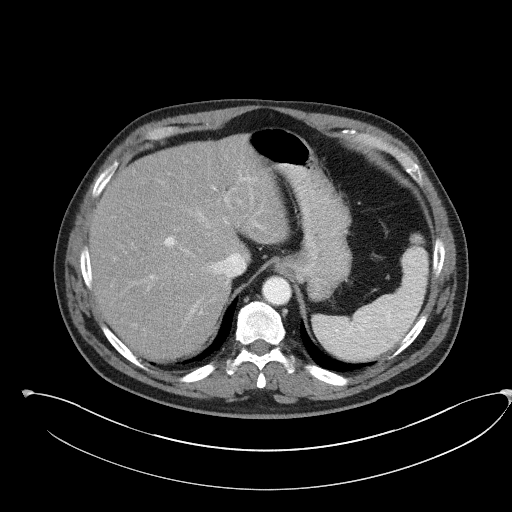
[im 110/117  soft-tissue]
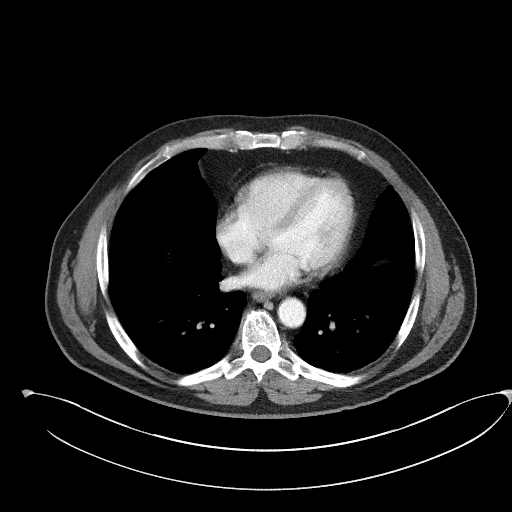

[Series 6: coronal st · coronal · 1.03mm/px · 3 of 97 slices shown]
[im 33/97  soft-tissue]
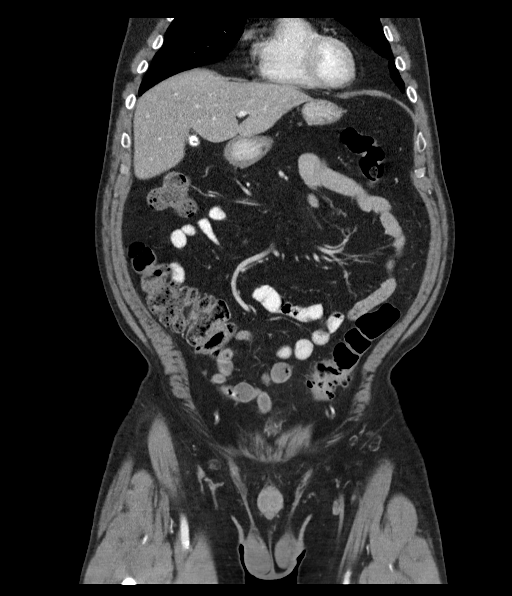
[im 43/97  soft-tissue]
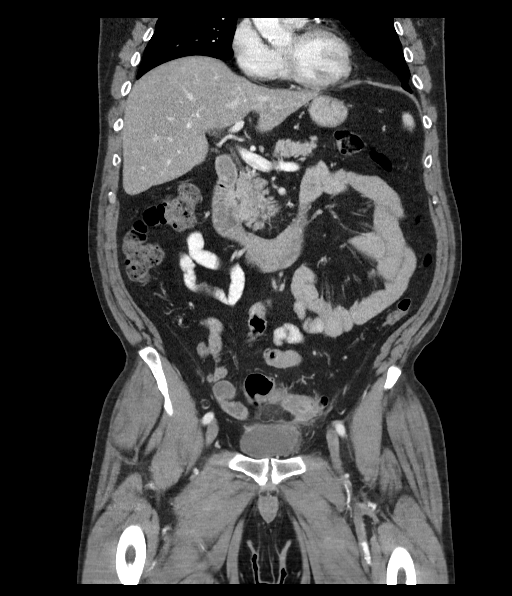
[im 54/97  soft-tissue]
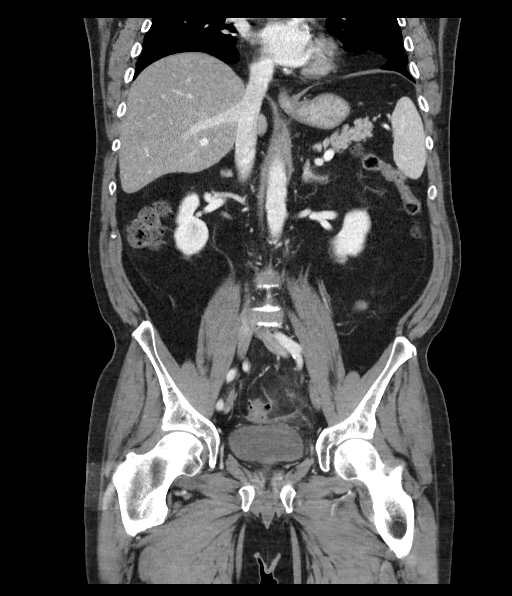

[16 of 46 positions shown; findings below may reference images not displayed]

FINDINGS: Lower chest: Lung bases clear bilaterally.

Hepatobiliary: Mild fatty infiltration liver without focal liver
lesion. Gallbladder is collapsed multiple gallstones. No gallbladder
wall thickening. No biliary dilatation

Pancreas: Negative

Spleen: Negative

Adrenals/Urinary Tract: Adrenal glands are unremarkable. Kidneys are
normal, without renal calculi, focal lesion, or hydronephrosis.
Bladder is unremarkable.

Stomach/Bowel: Negative for bowel obstruction. Normal appendix.
Normal stomach.

Sigmoid diverticular change with segmental thickening and edema in
the sigmoid colon. There is mild pericolonic edema in the area. No
abscess. Findings compatible with diverticulitis.

Vascular/Lymphatic: Mild atherosclerotic disease in the aorta. No
aneurysm or adenopathy.

Reproductive: Mild prostate enlargement.

Other: Negative for free fluid.

Musculoskeletal: No acute skeletal abnormality.
IMPRESSION: 1. Sigmoid diverticulitis. Negative for abscess.
2. Cholelithiasis.
3. Normal appendix

Aortic Atherosclerosis ([id number]).

## 2020-10-04 ENCOUNTER — Other Ambulatory Visit: Payer: Self-pay | Admitting: Adult Health

## 2020-10-04 DIAGNOSIS — E781 Pure hyperglyceridemia: Secondary | ICD-10-CM

## 2020-10-12 ENCOUNTER — Other Ambulatory Visit: Payer: Self-pay | Admitting: Adult Health

## 2020-10-20 ENCOUNTER — Other Ambulatory Visit: Payer: Self-pay | Admitting: Adult Health

## 2020-10-20 DIAGNOSIS — E118 Type 2 diabetes mellitus with unspecified complications: Secondary | ICD-10-CM

## 2021-01-10 ENCOUNTER — Other Ambulatory Visit: Payer: Self-pay | Admitting: Adult Health

## 2021-01-10 DIAGNOSIS — E118 Type 2 diabetes mellitus with unspecified complications: Secondary | ICD-10-CM

## 2021-01-10 DIAGNOSIS — E781 Pure hyperglyceridemia: Secondary | ICD-10-CM

## 2021-02-04 ENCOUNTER — Telehealth (INDEPENDENT_AMBULATORY_CARE_PROVIDER_SITE_OTHER): Payer: 59 | Admitting: Adult Health

## 2021-02-04 ENCOUNTER — Encounter: Payer: Self-pay | Admitting: Adult Health

## 2021-02-04 VITALS — HR 57 | Ht 69.0 in | Wt 172.0 lb

## 2021-02-04 DIAGNOSIS — Z1211 Encounter for screening for malignant neoplasm of colon: Secondary | ICD-10-CM

## 2021-02-04 DIAGNOSIS — D492 Neoplasm of unspecified behavior of bone, soft tissue, and skin: Secondary | ICD-10-CM

## 2021-02-04 NOTE — Progress Notes (Addendum)
Virtual Visit via Video Note  I connected with Matthew Patterson  02/04/21 at  4:30 PM EDT by a video enabled telemedicine application and verified that I am speaking with the correct person using two identifiers.  Location patient: home Location provider:work or home office Persons participating in the virtual visit: patient, provider  I discussed the limitations of evaluation and management by telemedicine and the availability of in person appointments. The patient expressed understanding and agreed to proceed.   HPI:  65 year old male who  has a past medical history of Anxiety and depression, Diabetes mellitus without complication ([place]), GERD (gastroesophageal reflux disease), and Hyperlipidemia.  He is being evaluated today virtually for referral to dermatology. His wife has noted a "mole" on his back and would like for him to get it checked out. He would like to see a dermatologist.   Would also like to do cologuard for his colon cancer screening. He was due in May 2021. Last was normal.   ROS: See pertinent positives and negatives per HPI.  Past Medical History:  Diagnosis Date   Anxiety and depression    Diabetes mellitus without complication ([facility])    GERD (gastroesophageal reflux disease)    Hyperlipidemia     Past Surgical History:  Procedure Laterality Date   ROTATOR CUFF REPAIR     SHOULDER SURGERY  2010   B/L    Family History  Problem Relation Age of Onset   Diabetes Mother    Hypertension Mother    Cancer Sister        uterine cancer       Current Outpatient Medications:    atorvastatin (LIPITOR) 20 MG tablet, TAKE EVERY TABLET BY MOUTH DAILY, Disp: 90 tablet, Rfl: 3   Blood Glucose Monitoring Suppl (ONETOUCH VERIO IQ SYSTEM) w/Device KIT, 1 kit, Disp: 1 kit, Rfl: 0   ciprofloxacin (CIPRO) 500 MG tablet, Take 1 tablet (500 mg total) by mouth 2 (two) times daily. One po bid x 7 days.  Start time 3\1\2020, Disp: 14 tablet, Rfl: 0   fenofibrate (TRICOR) 145 MG  tablet, TAKE 1 TABLET BY MOUTH EVERY DAY, Disp: 90 tablet, Rfl: 0   glucose blood test strip, Use as instructed, Disp: 100 each, Rfl: 12   Lancets (ONETOUCH ULTRASOFT) lancets, Use as instructed, Disp: 100 each, Rfl: 12   lisinopril (ZESTRIL) 2.5 MG tablet, TAKE 1 TABLET BY MOUTH EVERY DAY, Disp: 90 tablet, Rfl: 0   metFORMIN (GLUCOPHAGE) 500 MG tablet, Take 1 tablet (500 mg total) by mouth 2 (two) times daily with a meal., Disp: 180 tablet, Rfl: 0   metroNIDAZOLE (FLAGYL) 500 MG tablet, Take 1 tablet (500 mg total) by mouth 3 (three) times daily., Disp: 21 tablet, Rfl: 0   omeprazole (PRILOSEC) 20 MG capsule, TAKE ONE CAPSULE BY MOUTH TWICE DAILY FOR THREE WEEKS THEN ONE CAPSULE BEFORE EVENING MEAL, Disp: 90 capsule, Rfl: 0   traZODone (DESYREL) 50 MG tablet, TAKE 1/2 TO 1 TABLET BY MOUTH EVERY DAY AT BEDTIME AS NEEDED FOR SLEEP, Disp: 90 tablet, Rfl: 0  EXAM:  VITALS per patient if applicable:  GENERAL: alert, oriented, appears well and in no acute distress  HEENT: atraumatic, conjunttiva clear, no obvious abnormalities on inspection of external nose and ears  NECK: normal movements of the head and neck  LUNGS: on inspection no signs of respiratory distress, breathing rate appears normal, no obvious gross SOB, gasping or wheezing  CV: no obvious cyanosis  MS: moves all visible extremities without  noticeable abnormality  PSYCH/NEURO: pleasant and cooperative, no obvious depression or anxiety, speech and thought processing grossly intact  SKIN: unable to assess neoplasm during video conference   ASSESSMENT AND PLAN:  Discussed the following assessment and plan:   1. Skin neoplasm  - Ambulatory referral to Dermatology  2. Colon cancer screening  - Cologuard     I discussed the assessment and treatment plan with the patient. The patient was provided an opportunity to ask questions and all were answered. The patient agreed with the plan and demonstrated an understanding of  the instructions.   The patient was advised to call back or seek an in-person evaluation if the symptoms worsen or if the condition fails to improve as anticipated.   Dorothyann Peng, NP

## 2021-04-08 ENCOUNTER — Ambulatory Visit (INDEPENDENT_AMBULATORY_CARE_PROVIDER_SITE_OTHER): Payer: BLUE CROSS/BLUE SHIELD | Admitting: Adult Health

## 2021-04-08 ENCOUNTER — Other Ambulatory Visit: Payer: Self-pay | Admitting: Adult Health

## 2021-04-08 ENCOUNTER — Other Ambulatory Visit: Payer: Self-pay

## 2021-04-08 ENCOUNTER — Encounter: Payer: Self-pay | Admitting: Adult Health

## 2021-04-08 ENCOUNTER — Telehealth: Payer: Self-pay | Admitting: Adult Health

## 2021-04-08 VITALS — BP 130/80 | HR 58 | Temp 97.9°F | Ht 68.5 in | Wt 173.3 lb

## 2021-04-08 DIAGNOSIS — E781 Pure hyperglyceridemia: Secondary | ICD-10-CM

## 2021-04-08 DIAGNOSIS — Z125 Encounter for screening for malignant neoplasm of prostate: Secondary | ICD-10-CM

## 2021-04-08 DIAGNOSIS — G4700 Insomnia, unspecified: Secondary | ICD-10-CM

## 2021-04-08 DIAGNOSIS — E1165 Type 2 diabetes mellitus with hyperglycemia: Secondary | ICD-10-CM

## 2021-04-08 DIAGNOSIS — E118 Type 2 diabetes mellitus with unspecified complications: Secondary | ICD-10-CM

## 2021-04-08 DIAGNOSIS — Z1211 Encounter for screening for malignant neoplasm of colon: Secondary | ICD-10-CM

## 2021-04-08 DIAGNOSIS — Z0000 Encounter for general adult medical examination without abnormal findings: Secondary | ICD-10-CM | POA: Diagnosis not present

## 2021-04-08 LAB — COMPREHENSIVE METABOLIC PANEL
ALT: 21 U/L (ref 0–53)
AST: 12 U/L (ref 0–37)
Albumin: 4.2 g/dL (ref 3.5–5.2)
Alkaline Phosphatase: 60 U/L (ref 39–117)
BUN: 18 mg/dL (ref 6–23)
CO2: 28 mEq/L (ref 19–32)
Calcium: 9.1 mg/dL (ref 8.4–10.5)
Chloride: 100 mEq/L (ref 96–112)
Creatinine, Ser: 1.28 mg/dL (ref 0.40–1.50)
GFR: 58.78 mL/min — ABNORMAL LOW (ref >60.00)
Glucose, Bld: 324 mg/dL — ABNORMAL HIGH (ref 70–99)
Potassium: 4.4 mEq/L (ref 3.5–5.1)
Sodium: 137 mEq/L (ref 135–145)
Total Bilirubin: 1 mg/dL (ref 0.2–1.2)
Total Protein: 6.7 g/dL (ref 6.0–8.3)

## 2021-04-08 LAB — LIPID PANEL
Cholesterol: 262 mg/dL — ABNORMAL HIGH (ref 0–200)
HDL: 46 mg/dL (ref >39.00)
NonHDL: 216.14
Total CHOL/HDL Ratio: 6
Triglycerides: 399 mg/dL — ABNORMAL HIGH (ref 0.0–149.0)
VLDL: 79.8 mg/dL — ABNORMAL HIGH (ref 0.0–40.0)

## 2021-04-08 LAB — TSH: TSH: 2.33 u[IU]/mL (ref 0.35–5.50)

## 2021-04-08 LAB — CBC WITH DIFFERENTIAL/PLATELET
Basophils Absolute: 0.1 10*3/uL (ref 0.0–0.1)
Basophils Relative: 1.2 % (ref 0.0–3.0)
Eosinophils Absolute: 0.1 10*3/uL (ref 0.0–0.7)
Eosinophils Relative: 1.4 % (ref 0.0–5.0)
HCT: 46.2 % (ref 39.0–52.0)
Hemoglobin: 16.1 g/dL (ref 13.0–17.0)
Lymphocytes Relative: 22 % (ref 12.0–46.0)
Lymphs Abs: 1.1 10*3/uL (ref 0.7–4.0)
MCHC: 34.8 g/dL (ref 30.0–36.0)
MCV: 92.5 fl (ref 78.0–100.0)
Monocytes Absolute: 0.3 10*3/uL (ref 0.1–1.0)
Monocytes Relative: 6 % (ref 3.0–12.0)
Neutro Abs: 3.4 10*3/uL (ref 1.4–7.7)
Neutrophils Relative %: 69.4 % (ref 43.0–77.0)
Platelets: 220 10*3/uL (ref 150.0–400.0)
RBC: 4.99 Mil/uL (ref 4.22–5.81)
RDW: 12.7 % (ref 11.5–15.5)
WBC: 4.9 10*3/uL (ref 4.0–10.5)

## 2021-04-08 LAB — PSA: PSA: 0.61 ng/mL (ref 0.10–4.00)

## 2021-04-08 LAB — HEMOGLOBIN A1C: Hgb A1c MFr Bld: 11.9 % — ABNORMAL HIGH (ref 4.6–6.5)

## 2021-04-08 LAB — LDL CHOLESTEROL, DIRECT: Direct LDL: 156 mg/dL

## 2021-04-08 MED ORDER — FENOFIBRATE 145 MG PO TABS: 145.0000 mg | ORAL_TABLET | Freq: Every day | ORAL | 3 refills | Status: DC

## 2021-04-08 MED ORDER — GLIPIZIDE ER 2.5 MG PO TB24: 2.5000 mg | ORAL_TABLET | Freq: Every day | ORAL | 0 refills | Status: DC

## 2021-04-08 MED ORDER — TRAZODONE HCL 100 MG PO TABS: 100.0000 mg | ORAL_TABLET | Freq: Every day | ORAL | 1 refills | Status: DC

## 2021-04-08 MED ORDER — LISINOPRIL 2.5 MG PO TABS: 2.5000 mg | ORAL_TABLET | Freq: Every day | ORAL | 3 refills | Status: DC

## 2021-04-08 MED ORDER — METFORMIN HCL 1000 MG PO TABS: 1000.0000 mg | ORAL_TABLET | Freq: Two times a day (BID) | ORAL | 0 refills | Status: DC

## 2021-04-08 MED ORDER — ATORVASTATIN CALCIUM 40 MG PO TABS: ORAL_TABLET | ORAL | 3 refills | Status: DC

## 2021-04-08 MED ORDER — FREESTYLE LIBRE 2 SENSOR MISC: 3 refills | Status: DC

## 2021-04-08 NOTE — Telephone Encounter (Signed)
Spoke to patient informed him of his labs.  His A1c has increased from 7.6 a year ago to 11.9.  Despite the last metformin prescription being sent in on 12/2019 x90 days he does report that he takes his medication twice daily.  His cholesterol panel has also significantly increased, he does report taking his medication daily.  Will increase metformin to 1000 mg twice daily and add low-dose glipizide extended release.  I think there is some confusion on the part of the patient on what medications he is taking daily.   Increase Lipitor from 20 mg to 40 mg  Follow-up in 3 months

## 2021-04-08 NOTE — Addendum Note (Signed)
Addended by: Elmer Picker on: 04/08/2021 07:43 AM   Modules accepted: Orders

## 2021-04-08 NOTE — Progress Notes (Signed)
Subjective:    Patient ID: Matthew Patterson, male    DOB: [birth year], 65 y.o.   MRN: [id number]  HPI Patient presents for yearly preventative medicine examination. He is a pleasant 65 year old male who  has a past medical history of Anxiety and depression, Diabetes mellitus without complication ([place]), GERD (gastroesophageal reflux disease), and Hyperlipidemia.  DM II -only prescribed metformin 500 mg BID.  He does monitor his blood sugars at home on a regular basis and had reports that he has not had any blood sugar readings above 150, denies symptoms of hypoglycemia.  Does try to eat a heart healthy diet and exercise on a regular basis.  He takes lisinopril 2.5 mg for kidney protection Lab Results  Component Value Date   HGBA1C 7.6 (H) 12/15/2019   Hyperlipidemia -managed with Lipitor 20 mg daily and Tricor 145 mg.  He denies myalgia or fatigue Lab Results  Component Value Date   CHOL 186 12/15/2019   HDL 47.60 12/15/2019   LDLCALC 111 (H) 12/15/2019   LDLDIRECT 167.0 09/30/2018   TRIG 140.0 12/15/2019   CHOLHDL 4 12/15/2019   Insomnia - takes Trazodone 50 mg PRN. He reports that he takes the trazodone around 8 pm and does not fall asleep until 11-1 am. He usually gets about 4 hours of sleep a night. Does not snore and does not have apneic episodes   All immunizations and health maintenance protocols were reviewed with the patient and needed orders were placed.  Appropriate screening laboratory values were ordered for the patient including screening of hyperlipidemia, renal function and hepatic function. If indicated by BPH, a PSA was ordered.  Medication reconciliation,  past medical history, social history, problem list and allergies were reviewed in detail with the patient  Goals were established with regard to weight loss, exercise, and  diet in compliance with medications. He is no longer eating fast food.  Wt Readings from Last 3 Encounters:  04/08/21 173 lb 4.8 oz (78.6 kg)   02/04/21 172 lb (78 kg)  12/15/19 189 lb (85.7 kg)   He has cologuard at home but has not done it yet.    Review of Systems  Constitutional: Negative.   HENT: Negative.    Eyes: Negative.   Respiratory: Negative.    Cardiovascular: Negative.   Gastrointestinal: Negative.   Endocrine: Negative.   Genitourinary: Negative.   Musculoskeletal: Negative.   Skin: Negative.   Allergic/Immunologic: Negative.   Neurological: Negative.   Hematological: Negative.   Psychiatric/Behavioral:  Positive for sleep disturbance.   All other systems reviewed and are negative.  Past Medical History:  Diagnosis Date   Anxiety and depression    Diabetes mellitus without complication (HCC)    GERD (gastroesophageal reflux disease)    Hyperlipidemia     Social History   Socioeconomic History   Marital status: Married    Spouse name: Not on file   Number of children: Not on file   Years of education: Not on file   Highest education level: Not on file  Occupational History   Not on file  Tobacco Use   Smoking status: Never   Smokeless tobacco: Never  Substance and Sexual Activity   Alcohol use: Yes    Alcohol/week: 1.0 - 2.0 standard drink    Types: 1 - 2 Cans of beer per week   Drug use: No   Sexual activity: Not on file  Other Topics Concern   Not on file  Social History Narrative  Scientist, 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    One grandchild      He likes to play golf    Social Determinants of Radio broadcast assistant Strain: Not on file  Food Insecurity: Not on file  Transportation Needs: Not on file  Physical Activity: Not on file  Stress: Not on file  Social Connections: Not on file  Intimate Partner Violence: Not on file    Past Surgical History:  Procedure Laterality Date   ROTATOR CUFF REPAIR     SHOULDER SURGERY  2010   B/L    Family History  Problem Relation Age of Onset   Diabetes Mother    Hypertension Mother    Cancer Sister         uterine cancer    No Known Allergies  Current Outpatient Medications on File Prior to Visit  Medication Sig Dispense Refill   atorvastatin (LIPITOR) 20 MG tablet TAKE EVERY TABLET BY MOUTH DAILY 90 tablet 3   Blood Glucose Monitoring Suppl (ONETOUCH VERIO IQ SYSTEM) w/Device KIT 1 kit 1 kit 0   fenofibrate (TRICOR) 145 MG tablet TAKE 1 TABLET BY MOUTH EVERY DAY 90 tablet 0   glucose blood test strip Use as instructed 100 each 12   Lancets (ONETOUCH ULTRASOFT) lancets Use as instructed 100 each 12   lisinopril (ZESTRIL) 2.5 MG tablet TAKE 1 TABLET BY MOUTH EVERY DAY 90 tablet 0   metFORMIN (GLUCOPHAGE) 500 MG tablet Take 1 tablet (500 mg total) by mouth 2 (two) times daily with a meal. 180 tablet 0   traZODone (DESYREL) 50 MG tablet TAKE 1/2 TO 1 TABLET BY MOUTH EVERY DAY AT BEDTIME AS NEEDED FOR SLEEP 90 tablet 0   No current facility-administered medications on file prior to visit.    BP 130/80 (BP Location: Left Arm, Patient Position: Sitting, Cuff Size: Normal)   Pulse (!) 58   Temp 97.9 F (36.6 C) (Oral)   Ht 5' 8.5" (1.74 m)   Wt 173 lb 4.8 oz (78.6 kg)   SpO2 96%   BMI 25.97 kg/m        Objective:   Physical Exam Vitals and nursing note reviewed.  Constitutional:      General: He is not in acute distress.    Appearance: Normal appearance. He is well-developed and normal weight.  HENT:     Head: Normocephalic and atraumatic.     Right Ear: Tympanic membrane, ear canal and external ear normal. There is no impacted cerumen.     Left Ear: Tympanic membrane, ear canal and external ear normal. There is no impacted cerumen.     Nose: Nose normal. No congestion or rhinorrhea.     Mouth/Throat:     Mouth: Mucous membranes are moist.     Pharynx: Oropharynx is clear. No oropharyngeal exudate or posterior oropharyngeal erythema.  Eyes:     General:        Right eye: No discharge.        Left eye: No discharge.     Extraocular Movements: Extraocular movements intact.      Conjunctiva/sclera: Conjunctivae normal.     Pupils: Pupils are equal, round, and reactive to light.  Neck:     Vascular: No carotid bruit.     Trachea: No tracheal deviation.  Cardiovascular:     Rate and Rhythm: Normal rate and regular rhythm.     Pulses: Normal pulses.     Heart sounds: Normal  heart sounds. No murmur heard.   No friction rub. No gallop.  Pulmonary:     Effort: Pulmonary effort is normal. No respiratory distress.     Breath sounds: Normal breath sounds. No stridor. No wheezing, rhonchi or rales.  Chest:     Chest wall: No tenderness.  Abdominal:     General: Bowel sounds are normal. There is no distension.     Palpations: Abdomen is soft. There is no mass.     Tenderness: There is no abdominal tenderness. There is no right CVA tenderness, left CVA tenderness, guarding or rebound.     Hernia: No hernia is present.  Musculoskeletal:        General: No swelling, tenderness, deformity or signs of injury. Normal range of motion.     Right lower leg: No edema.     Left lower leg: No edema.  Lymphadenopathy:     Cervical: No cervical adenopathy.  Skin:    General: Skin is warm and dry.     Capillary Refill: Capillary refill takes less than 2 seconds.     Coloration: Skin is not jaundiced or pale.     Findings: No bruising, erythema, lesion or rash.  Neurological:     General: No focal deficit present.     Mental Status: He is alert and oriented to person, place, and time.     Cranial Nerves: No cranial nerve deficit.     Sensory: No sensory deficit.     Motor: No weakness.     Coordination: Coordination normal.     Gait: Gait normal.     Deep Tendon Reflexes: Reflexes normal.  Psychiatric:        Mood and Affect: Mood normal.        Behavior: Behavior normal.        Thought Content: Thought content normal.        Judgment: Judgment normal.      Assessment & Plan:  1. Routine general medical examination at a health care facility - Refused vaccinations -  Needs to have diabetic eye exam done - Follow up in three months  - CBC with Differential/Platelet; Future - Comprehensive metabolic panel; Future - Hemoglobin A1c; Future - Lipid panel; Future - TSH; Future - PSA; Future  2. Colon cancer screening - Encouraged to do cologuard   3. Insomnia, unspecified type - Will increase Trazodone to 100 mg - Follow up in 1-2 weeks if sleep is not at goal  - traZODone (DESYREL) 100 MG tablet; Take 1 tablet (100 mg total) by mouth at bedtime.  Dispense: 90 tablet; Refill: 1  4. Prostate cancer screening  - PSA; Future  5. HYPERTRIGLYCERIDEMIA - Consider increase in lipitor  - CBC with Differential/Platelet; Future - Comprehensive metabolic panel; Future - Hemoglobin A1c; Future - Lipid panel; Future - TSH; Future  6. Uncontrolled type 2 diabetes mellitus with hyperglycemia (HCC) - likely increase metformin or add agent - Follow up in 3 months or sooner if needed - CBC with Differential/Platelet; Future - Comprehensive metabolic panel; Future - Hemoglobin A1c; Future - Lipid panel; Future - TSH; Future  Dorothyann Peng, NP

## 2021-04-08 NOTE — Telephone Encounter (Signed)
Patient wants to speak about results from labs today with [facility]. I let patient know that he may get someone from [facility]'s team, but he would like to speak to [facility] specifically.    Good callback number [phone]   Please advise

## 2021-04-10 ENCOUNTER — Other Ambulatory Visit: Payer: Self-pay | Admitting: Adult Health

## 2021-04-10 DIAGNOSIS — E118 Type 2 diabetes mellitus with unspecified complications: Secondary | ICD-10-CM

## 2021-04-10 DIAGNOSIS — E781 Pure hyperglyceridemia: Secondary | ICD-10-CM

## 2021-04-10 NOTE — Telephone Encounter (Signed)
Called pt no answer °

## 2021-04-11 NOTE — Telephone Encounter (Signed)
Pt stated that Tommi Rumps has already talked to him about his labs Tuesday. Pt stated that he had a question to ask Tommi Rumps but he stated that he has figured it out. Pt stated that nothing further is needed!

## 2021-04-16 ENCOUNTER — Encounter: Payer: Self-pay | Admitting: Adult Health

## 2021-04-16 DIAGNOSIS — Z1211 Encounter for screening for malignant neoplasm of colon: Secondary | ICD-10-CM | POA: Diagnosis not present

## 2021-04-22 LAB — COLOGUARD: Cologuard: NEGATIVE

## 2021-04-23 ENCOUNTER — Encounter: Payer: Self-pay | Admitting: Adult Health

## 2021-04-23 ENCOUNTER — Other Ambulatory Visit: Payer: Self-pay

## 2021-04-23 ENCOUNTER — Ambulatory Visit (INDEPENDENT_AMBULATORY_CARE_PROVIDER_SITE_OTHER): Payer: BLUE CROSS/BLUE SHIELD | Admitting: Adult Health

## 2021-04-23 VITALS — BP 138/78 | HR 59 | Temp 97.9°F | Wt 177.4 lb

## 2021-04-23 DIAGNOSIS — E1165 Type 2 diabetes mellitus with hyperglycemia: Secondary | ICD-10-CM

## 2021-04-23 DIAGNOSIS — Z978 Presence of other specified devices: Secondary | ICD-10-CM | POA: Diagnosis not present

## 2021-04-23 NOTE — Progress Notes (Signed)
Subjective:    Patient ID: Matthew Patterson, male    DOB: 1956/02/03, 65 y.o.   MRN: [id number]  HPI 65 year old male who  has a past medical history of Anxiety and depression, Diabetes mellitus without complication ([place]), GERD (gastroesophageal reflux disease), and Hyperlipidemia.  He presents to the office today for follow-up regarding diabetes mellitus.  He was seen earlier this month, at which time his A1c had increased from 7.6-11.9.  He was prescribed the [place] system to monitor his blood sugars, metformin was increased to 1000 mg twice daily and glipizide 2.5 mg extended release was added.  When he picked up the [place] system the pharmacist told him that "usually the first monitor is placed in the office".  He is also confused on why he was prescribed glipizide, he does not remember discussing this   Review of Systems See HPI   Past Medical History:  Diagnosis Date  . Anxiety and depression   . Diabetes mellitus without complication ([place])   . GERD (gastroesophageal reflux disease)   . Hyperlipidemia     Social History   Socioeconomic History  . Marital status: Married    Spouse name: Not on file  . Number of children: Not on file  . Years of education: Not on file  . Highest education level: Not on file  Occupational History  . Not on file  Tobacco Use  . Smoking status: Never  . Smokeless tobacco: Never  Substance and Sexual Activity  . Alcohol use: Yes    Alcohol/week: 1.0 - 2.0 standard drink    Types: 1 - 2 Cans of beer per week  . Drug use: No  . Sexual activity: Not on file  Other Topics Concern  . Not on file  Social History Narrative   Scientist, 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    One grandchild      He likes to play golf    Social Determinants of Health   Financial Resource Strain: Not on file  Food Insecurity: Not on file  Transportation Needs: Not on file  Physical Activity: Not on file  Stress: Not on file  Social  Connections: Not on file  Intimate Partner Violence: Not on file    Past Surgical History:  Procedure Laterality Date  . ROTATOR CUFF REPAIR    . SHOULDER SURGERY  2010   B/L    Family History  Problem Relation Age of Onset  . Diabetes Mother   . Hypertension Mother   . Cancer Sister        uterine cancer    No Known Allergies  Current Outpatient Medications on File Prior to Visit  Medication Sig Dispense Refill  . atorvastatin (LIPITOR) 40 MG tablet TAKE EVERY TABLET BY MOUTH DAILY 90 tablet 3  . Blood Glucose Monitoring Suppl (ONETOUCH VERIO IQ SYSTEM) w/Device KIT 1 kit 1 kit 0  . Continuous Blood Gluc Sensor (FREESTYLE LIBRE 2 SENSOR) MISC Use with libre app 6 each 3  . fenofibrate (TRICOR) 145 MG tablet Take 1 tablet (145 mg total) by mouth daily. 90 tablet 3  . glipiZIDE (GLUCOTROL XL) 2.5 MG 24 hr tablet Take 1 tablet (2.5 mg total) by mouth daily with breakfast. 90 tablet 0  . glucose blood test strip Use as instructed 100 each 12  . Lancets (ONETOUCH ULTRASOFT) lancets Use as instructed 100 each 12  . lisinopril (ZESTRIL) 2.5 MG tablet Take 1 tablet (2.5 mg total)  by mouth daily. 90 tablet 3  . metFORMIN (GLUCOPHAGE) 1000 MG tablet Take 1 tablet (1,000 mg total) by mouth 2 (two) times daily with a meal. 180 tablet 0  . traZODone (DESYREL) 100 MG tablet Take 1 tablet (100 mg total) by mouth at bedtime. 90 tablet 1   No current facility-administered medications on file prior to visit.    BP 138/78 (BP Location: Left Arm, Patient Position: Sitting, Cuff Size: Normal)   Pulse (!) 59   Temp 97.9 F (36.6 C) (Oral)   Wt 177 lb 6.4 oz (80.5 kg)   SpO2 96%   BMI 26.58 kg/m       Objective:   Physical Exam Vitals and nursing note reviewed.  Constitutional:      Appearance: Normal appearance.  Skin:    Capillary Refill: Capillary refill takes less than 2 seconds.  Neurological:     General: No focal deficit present.     Mental Status: He is alert and oriented  to person, place, and time.  Psychiatric:        Mood and Affect: Mood normal.        Behavior: Behavior normal.        Thought Content: Thought content normal.        Judgment: Judgment normal.      Assessment & Plan:   1. Uncontrolled type 2 diabetes mellitus with hyperglycemia ([place]) -There may have been some miscommunication on the medications that he supposed be taking.  We reviewed all of his medications and the reason for them.  He will start taking glipizide.  2. Uses self-applied continuous glucose monitoring device -Patient was shown instructions on how to apply his continuous glucose monitor.  He was able to demonstrate without difficulty.  No advise follow-up with any other questions  Dorothyann Peng, NP

## 2021-05-12 ENCOUNTER — Telehealth: Payer: Self-pay | Admitting: Adult Health

## 2021-05-12 NOTE — Telephone Encounter (Signed)
Patient's wife called on patient's behalf because glucose level is dropping to around 57 throughout day and night. A1C is around 11.7 and they don't understand why the glucose levels and A1C are doing this. Wife states this has been happening for around a month. Patient would just like some advice on this.   Good callback number is [phone]   Please advise

## 2021-05-13 NOTE — Telephone Encounter (Signed)
Left message to return phone call.

## 2021-05-14 ENCOUNTER — Other Ambulatory Visit: Payer: Self-pay

## 2021-05-15 ENCOUNTER — Ambulatory Visit: Payer: BLUE CROSS/BLUE SHIELD | Admitting: Adult Health

## 2021-05-15 ENCOUNTER — Encounter: Payer: Self-pay | Admitting: Adult Health

## 2021-05-15 VITALS — BP 100/62 | HR 58 | Temp 98.5°F | Ht 68.5 in | Wt 177.0 lb

## 2021-05-15 DIAGNOSIS — E1165 Type 2 diabetes mellitus with hyperglycemia: Secondary | ICD-10-CM

## 2021-05-15 NOTE — Progress Notes (Signed)
Subjective:    Patient ID: Matthew Patterson, male    DOB: 1956/04/04, 65 y.o.   MRN: [id number]  HPI 65 year old male who  has a past medical history of Anxiety and depression, Diabetes mellitus without complication ([place]), GERD (gastroesophageal reflux disease), and Hyperlipidemia.  \Presents to the office today for follow-up regarding diabetes mellitus.  When he was last seen in early September 2022 his A1c had increased from 7.6-11.9.  He was prescribed a libre system to monitor his blood sugars, metformin was increased to 1000 mg twice daily and glipizide 2.5 mg ER was added.  Since then his blood sugars have been spiking and dropping and he has been experiencing episodes of hypoglycemia throughout the day and night with readings as low as 57 and as high as 245. He has been working on his diet. Eating less carbs, less sodas, and eating more vegetables and lean meats.   Wt Readings from Last 3 Encounters:  05/15/21 177 lb (80.3 kg)  04/23/21 177 lb 6.4 oz (80.5 kg)  04/08/21 173 lb 4.8 oz (78.6 kg)   Review of Systems See HPI   Past Medical History:  Diagnosis Date   Anxiety and depression    Diabetes mellitus without complication (HCC)    GERD (gastroesophageal reflux disease)    Hyperlipidemia     Social History   Socioeconomic History   Marital status: Married    Spouse name: Not on file   Number of children: Not on file   Years of education: Not on file   Highest education level: Not on file  Occupational History   Not on file  Tobacco Use   Smoking status: Never   Smokeless tobacco: Never  Substance and Sexual Activity   Alcohol use: Yes    Alcohol/week: 1.0 - 2.0 standard drink    Types: 1 - 2 Cans of beer per week   Drug use: No   Sexual activity: Not on file  Other Topics Concern   Not on file  Social History Narrative   Scientist, 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    One grandchild      He likes to play golf    Social Determinants of  Radio broadcast assistant Strain: Not on file  Food Insecurity: Not on file  Transportation Needs: Not on file  Physical Activity: Not on file  Stress: Not on file  Social Connections: Not on file  Intimate Partner Violence: Not on file    Past Surgical History:  Procedure Laterality Date   ROTATOR CUFF REPAIR     SHOULDER SURGERY  2010   B/L    Family History  Problem Relation Age of Onset   Diabetes Mother    Hypertension Mother    Cancer Sister        uterine cancer    No Known Allergies  Current Outpatient Medications on File Prior to Visit  Medication Sig Dispense Refill   atorvastatin (LIPITOR) 40 MG tablet TAKE EVERY TABLET BY MOUTH DAILY 90 tablet 3   Blood Glucose Monitoring Suppl (ONETOUCH VERIO IQ SYSTEM) w/Device KIT 1 kit 1 kit 0   Continuous Blood Gluc Sensor (FREESTYLE LIBRE 2 SENSOR) MISC Use with libre app 6 each 3   fenofibrate (TRICOR) 145 MG tablet Take 1 tablet (145 mg total) by mouth daily. 90 tablet 3   glipiZIDE (GLUCOTROL XL) 2.5 MG 24 hr tablet Take 1 tablet (2.5 mg total) by mouth  daily with breakfast. 90 tablet 0   glucose blood test strip Use as instructed 100 each 12   Lancets (ONETOUCH ULTRASOFT) lancets Use as instructed 100 each 12   lisinopril (ZESTRIL) 2.5 MG tablet Take 1 tablet (2.5 mg total) by mouth daily. 90 tablet 3   metFORMIN (GLUCOPHAGE) 1000 MG tablet Take 1 tablet (1,000 mg total) by mouth 2 (two) times daily with a meal. 180 tablet 0   traZODone (DESYREL) 100 MG tablet Take 1 tablet (100 mg total) by mouth at bedtime. 90 tablet 1   No current facility-administered medications on file prior to visit.    BP 100/62   Pulse (!) 58   Temp 98.5 F (36.9 C) (Oral)   Ht 5' 8.5" (1.74 m)   Wt 177 lb (80.3 kg)   SpO2 97%   BMI 26.52 kg/m       Objective:   Physical Exam Vitals and nursing note reviewed.  Constitutional:      Appearance: Normal appearance.  Cardiovascular:     Rate and Rhythm: Normal rate and regular  rhythm.     Pulses: Normal pulses.     Heart sounds: Normal heart sounds.  Pulmonary:     Effort: Pulmonary effort is normal.     Breath sounds: Normal breath sounds.  Skin:    General: Skin is warm and dry.  Neurological:     Mental Status: He is alert.       Assessment & Plan:  1. Uncontrolled type 2 diabetes mellitus with hyperglycemia ([place]) - Will d/c Glipizide - Place on Mounjaro 2.5 mg weekly.  - He was shown how to use the pen. And reviewed side effects. I expect his glucose to be slightly higher on the low dose of Mounjaro  - Follow up in one month virtually to see how he is doing   Dorothyann Peng, NP

## 2021-05-15 NOTE — Telephone Encounter (Signed)
Pt has been scheduled.  °

## 2021-06-09 ENCOUNTER — Encounter: Payer: Self-pay | Admitting: Adult Health

## 2021-06-09 DIAGNOSIS — E118 Type 2 diabetes mellitus with unspecified complications: Secondary | ICD-10-CM

## 2021-06-10 MED ORDER — MOUNJARO 2.5 MG/0.5ML ~~LOC~~ SOAJ: 2.5000 mg | SUBCUTANEOUS | 0 refills | Status: DC

## 2021-06-16 ENCOUNTER — Telehealth: Payer: Self-pay | Admitting: Adult Health

## 2021-06-16 NOTE — Telephone Encounter (Signed)
Noted! Spoke to pt and advised that Tommi Rumps was not in office on Mondays. I advised pt that I will route message to [facility] for advise and call him back as soon as Tommi Rumps is able to advise. Pt verbalized understanding.

## 2021-06-16 NOTE — Telephone Encounter (Signed)
Mounjaro injectable isn't covered under his insurance.  Matthew Patterson has list of covered medications.  Please call Gerard with recommended medication and he can check his list.  His list is 219 injectables long.  Patient was supposed to take injection last Friday.  [phone] -- Lennette Bihari

## 2021-06-17 NOTE — Telephone Encounter (Signed)
Patient notified of update  and verbalized understanding. 

## 2021-06-18 ENCOUNTER — Telehealth: Payer: Self-pay | Admitting: Adult Health

## 2021-06-18 NOTE — Telephone Encounter (Signed)
Patient's wife called to ask for a referral to a diabetic dietician named Janalee Dane at [place], [address], [place]  Phone number for Janalee Dane is [phone]  Email is [email or web address]   Wife states they will need referral, demographics, labs and insurance sent over.  Good fax number is [phone]  Wife also mentioned that while they do have a free sample of mounjaro it is not covered by [employer], but ozempic is, so next prescription will have to be for ozempic.     Please advise

## 2021-06-19 ENCOUNTER — Telehealth: Payer: Self-pay | Admitting: Adult Health

## 2021-06-19 DIAGNOSIS — E118 Type 2 diabetes mellitus with unspecified complications: Secondary | ICD-10-CM

## 2021-06-19 NOTE — Telephone Encounter (Signed)
Noted  

## 2021-06-19 NOTE — Telephone Encounter (Signed)
Pt is calling and would like a referral to dr Collier Flowers endocrinologist for dm. Phone number [phone]. Pt has bcbs

## 2021-06-19 NOTE — Telephone Encounter (Signed)
Awaiting PA response for monjauro. Okay for referral?

## 2021-06-19 NOTE — Telephone Encounter (Addendum)
Pt is calling back to let cory know with the coupon mounjaro cost 1100.00. Pt will wait for PA. Pt also has new pharm  [facility], [place] [facility] [place] Phone:  [phone]  Fax:  [phone]

## 2021-06-20 ENCOUNTER — Other Ambulatory Visit: Payer: Self-pay | Admitting: Adult Health

## 2021-06-20 ENCOUNTER — Telehealth: Payer: Self-pay

## 2021-06-20 DIAGNOSIS — E118 Type 2 diabetes mellitus with unspecified complications: Secondary | ICD-10-CM

## 2021-06-20 MED ORDER — OZEMPIC (0.25 OR 0.5 MG/DOSE) 2 MG/1.5ML ~~LOC~~ SOPN
PEN_INJECTOR | SUBCUTANEOUS | 2 refills | Status: DC
Start: 2021-06-20 — End: 2021-07-04

## 2021-06-20 MED ORDER — MOUNJARO 2.5 MG/0.5ML ~~LOC~~ SOAJ: 2.5000 mg | SUBCUTANEOUS | 0 refills | Status: DC

## 2021-06-20 NOTE — Telephone Encounter (Signed)
Wife of patient called stating patient can be reached at [phone] and that rx was approved by insurance and that patient needs rx right away

## 2021-06-20 NOTE — Telephone Encounter (Signed)
Patient is requesting for the Ozempic to be sent to [facility] at [facility], [place], [place] [address].  Please advise.

## 2021-06-20 NOTE — Telephone Encounter (Signed)
Left message to return phone call.

## 2021-06-20 NOTE — Telephone Encounter (Signed)
Patient spouse called in to see what was the update on this. She said the patient would like to be prescribed ozempic instead of mounjaro.   Patient could be contacted at [phone].  Please advise.

## 2021-06-20 NOTE — Telephone Encounter (Signed)
Noted! Please advise on dosage

## 2021-06-20 NOTE — Telephone Encounter (Signed)
Noted  Referral placed.

## 2021-06-20 NOTE — Telephone Encounter (Signed)
Pt is aware endo referral has been placed

## 2021-06-20 NOTE — Telephone Encounter (Addendum)
Pt is calling and [facility] system at [address] in [place]  is down  and pt will call another [facility] to see who has medication in stock and will callback with information

## 2021-06-20 NOTE — Telephone Encounter (Signed)
Please advise 

## 2021-06-20 NOTE — Telephone Encounter (Signed)
Called pt back as spouse is not on pt DPR. We are waiting for the insurance to state if they are approving this medication or denying it on our end. Looks like it is pending. If pt is wanting [place] to just cancel the process and send in [place] anyway regardless of outcome then we can.

## 2021-06-23 NOTE — Telephone Encounter (Signed)
Noted  

## 2021-06-24 NOTE — Telephone Encounter (Signed)
Received a letter stating that the Annamary Rummage was not covered under pt insurance. Will call pt with an update.

## 2021-07-03 ENCOUNTER — Other Ambulatory Visit: Payer: Self-pay | Admitting: Adult Health

## 2021-07-03 DIAGNOSIS — E1165 Type 2 diabetes mellitus with hyperglycemia: Secondary | ICD-10-CM

## 2021-07-03 NOTE — Telephone Encounter (Signed)
Patient and his wife called wanting to get their pharmacy changed to [facility] at [address], [place], [place] [address].   Also, the patient would like to know if Tommi Rumps recommends a diabetic dietician within the [facility] system. Patient's wife says that they called [employer] [place] and the dieticians they were given are with [facility]. They are wanting to stay within [facility]. They would also like to know if a referral would be needed.  Patient is also needing a refill on his Continuous Blood Gluc Sensor (FREESTYLE LIBRE 2 SENSOR) MISC to be sent to the [facility] at [employer].  Patient's wife says that BCBS denied the request for mounjaro and they would like a new request for Semaglutide,0.25 or 0.5MG /DOS, (OZEMPIC, 0.25 OR 0.5 MG/DOSE,) 2 MG/1.5ML SOPN to be sent over high priority because Chung will need it. They would like this sent over to Krist[facility] Oppenheim at [facility].  Patient can be reached at [phone].  Please advise.

## 2021-07-04 ENCOUNTER — Other Ambulatory Visit: Payer: Self-pay | Admitting: Adult Health

## 2021-07-04 MED ORDER — FREESTYLE LIBRE 2 SENSOR MISC: 3 refills | Status: DC

## 2021-07-04 MED ORDER — OZEMPIC (0.25 OR 0.5 MG/DOSE) 2 MG/1.5ML ~~LOC~~ SOPN
PEN_INJECTOR | SUBCUTANEOUS | 1 refills | Status: DC
Start: 2021-07-04 — End: 2021-09-12

## 2021-07-04 NOTE — Telephone Encounter (Signed)
Pt notified that meds refilled to pharmacy requested. Will send referral request to PCP for review. Pt has f/u appt scheduled for 07/11/21

## 2021-07-07 ENCOUNTER — Ambulatory Visit: Payer: BLUE CROSS/BLUE SHIELD | Admitting: Dietician

## 2021-07-11 ENCOUNTER — Ambulatory Visit: Payer: BLUE CROSS/BLUE SHIELD | Admitting: Adult Health

## 2021-07-11 ENCOUNTER — Encounter: Payer: Self-pay | Admitting: Adult Health

## 2021-07-11 VITALS — BP 108/62 | HR 77 | Temp 98.6°F | Ht 68.5 in | Wt 168.0 lb

## 2021-07-11 DIAGNOSIS — Z23 Encounter for immunization: Secondary | ICD-10-CM

## 2021-07-11 DIAGNOSIS — E118 Type 2 diabetes mellitus with unspecified complications: Secondary | ICD-10-CM

## 2021-07-11 LAB — POCT GLYCOSYLATED HEMOGLOBIN (HGB A1C): HbA1c, POC (controlled diabetic range): 5.9 % (ref 0.0–7.0)

## 2021-07-11 NOTE — Progress Notes (Signed)
Subjective:    Patient ID: Matthew Patterson, male    DOB: April 25, 1956, 64 y.o.   MRN: [id number]  HPI 65 year old male who  has a past medical history of Anxiety and depression, Diabetes mellitus without complication ([place]), GERD (gastroesophageal reflux disease), and Hyperlipidemia.  He presents to the office today for follow up regarding DM   He is currently prescribed metformin 1000 mg twice daily and Ozempic 0.25 mg weekly. In September his A1c had increased from 7.6 to 11.9.   Originally tried to get him prescribed on [place] but insurance would not cover and he was unable to use a savings card.  Since starting on Ozempic 0.25 mg.   He uses the [facility] system to monitor his blood sugars. He has been in goal range 94 % of the time with no hypogycemic events.   He has changed his diet and is eating healthier.   He is also scheduled to see a diabetic nutritionist in February  Lab Results  Component Value Date   HGBA1C 11.9 (H) 04/08/2021   Wt Readings from Last 3 Encounters:  07/11/21 168 lb (76.2 kg)  05/15/21 177 lb (80.3 kg)  04/23/21 177 lb 6.4 oz (80.5 kg)    Review of Systems See HPI   Past Medical History:  Diagnosis Date   Anxiety and depression    Diabetes mellitus without complication (HCC)    GERD (gastroesophageal reflux disease)    Hyperlipidemia     Social History   Socioeconomic History   Marital status: Married    Spouse name: Not on file   Number of children: Not on file   Years of education: Not on file   Highest education level: Not on file  Occupational History   Not on file  Tobacco Use   Smoking status: Never   Smokeless tobacco: Never  Substance and Sexual Activity   Alcohol use: Yes    Alcohol/week: 1.0 - 2.0 standard drink    Types: 1 - 2 Cans of beer per week   Drug use: No   Sexual activity: Not on file  Other Topics Concern   Not on file  Social History Narrative   Scientist, 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     One grandchild      He likes to play golf    Social Determinants of Health   Financial Resource Strain: Not on file  Food Insecurity: Not on file  Transportation Needs: Not on file  Physical Activity: Not on file  Stress: Not on file  Social Connections: Not on file  Intimate Partner Violence: Not on file    Past Surgical History:  Procedure Laterality Date   ROTATOR CUFF REPAIR     SHOULDER SURGERY  2010   B/L    Family History  Problem Relation Age of Onset   Diabetes Mother    Hypertension Mother    Cancer Sister        uterine cancer    No Known Allergies  Current Outpatient Medications on File Prior to Visit  Medication Sig Dispense Refill   atorvastatin (LIPITOR) 40 MG tablet TAKE EVERY TABLET BY MOUTH DAILY 90 tablet 3   Blood Glucose Monitoring Suppl (ONETOUCH VERIO IQ SYSTEM) w/Device KIT 1 kit 1 kit 0   Continuous Blood Gluc Sensor (FREESTYLE LIBRE 2 SENSOR) MISC Use with libre app 6 each 3   fenofibrate (TRICOR) 145 MG tablet Take 1 tablet (145 mg total)  by mouth daily. 90 tablet 3   glucose blood test strip Use as instructed 100 each 12   Lancets (ONETOUCH ULTRASOFT) lancets Use as instructed 100 each 12   lisinopril (ZESTRIL) 2.5 MG tablet Take 1 tablet (2.5 mg total) by mouth daily. 90 tablet 3   metFORMIN (GLUCOPHAGE) 1000 MG tablet TAKE 1 TABLET(1000 MG) BY MOUTH TWICE DAILY WITH A MEAL 180 tablet 0   Semaglutide,0.25 or 0.5MG/DOS, (OZEMPIC, 0.25 OR 0.5 MG/DOSE,) 2 MG/1.5ML SOPN 0.25 mg subQ weekly x 4 weeks and then increase to 0.5 mg weekly 1.5 mL 1   traZODone (DESYREL) 100 MG tablet Take 1 tablet (100 mg total) by mouth at bedtime. 90 tablet 1   No current facility-administered medications on file prior to visit.    BP 108/62   Pulse 77   Temp 98.6 F (37 C) (Oral)   Ht 5' 8.5" (1.74 m)   Wt 168 lb (76.2 kg)   SpO2 98%   BMI 25.17 kg/m       Objective:   Physical Exam Vitals and nursing note reviewed.  Constitutional:       Appearance: Normal appearance.  Musculoskeletal:        General: Normal range of motion.  Skin:    General: Skin is warm and dry.     Capillary Refill: Capillary refill takes less than 2 seconds.  Neurological:     General: No focal deficit present.     Mental Status: He is alert and oriented to person, place, and time.  Psychiatric:        Mood and Affect: Mood normal.        Behavior: Behavior normal.        Thought Content: Thought content normal.        Judgment: Judgment normal.       Assessment & Plan:  1. Controlled type 2 diabetes mellitus with complication, without long-term current use of insulin (HCC)  - POC HgB A1c- 5.9 - now at goal.  - Will have him increase Ozempic to 0.5 mg - Follow up in three months or sooner if needed   2. Need for immunization against influenza  - Flu Vaccine QUAD High Dose(Fluad)   Dorothyann Peng, NP

## 2021-07-11 NOTE — Patient Instructions (Addendum)
Your A1c was 5.9, this is down from 11.9 in September!   On your fifth shot of Ozempic increase to 0.5 mg dose and stay at this dose.   Send me a message the first week in January to see if things have changed with the [place].   I will see you back in three months

## 2021-08-03 DIAGNOSIS — C439 Malignant melanoma of skin, unspecified: Secondary | ICD-10-CM

## 2021-08-03 HISTORY — DX: Malignant melanoma of skin, unspecified: C43.9

## 2021-08-03 HISTORY — PX: MELANOMA EXCISION: SHX5266

## 2021-09-11 ENCOUNTER — Telehealth: Payer: Self-pay | Admitting: Adult Health

## 2021-09-11 NOTE — Telephone Encounter (Signed)
Patient called because he is out of his ozempic and the pharmacy has stated they do not know when they will get more ozempic in. Patient is asking for a sample of ozempic or mounjaro to hold him over.    Good callback number is [phone]    Please advise

## 2021-09-12 ENCOUNTER — Other Ambulatory Visit: Payer: Self-pay

## 2021-09-12 MED ORDER — OZEMPIC (0.25 OR 0.5 MG/DOSE) 2 MG/1.5ML ~~LOC~~ SOPN: 0.5000 mg | PEN_INJECTOR | SUBCUTANEOUS | 1 refills | Status: DC

## 2021-09-12 NOTE — Telephone Encounter (Signed)
Called the pharmacy this morning ans they advised that they had the medication. Pt advised of update. Called pt later today and he stated he was able to pick medication up.

## 2021-09-15 ENCOUNTER — Ambulatory Visit: Payer: BLUE CROSS/BLUE SHIELD | Admitting: Dietician

## 2021-09-16 ENCOUNTER — Ambulatory Visit: Payer: BLUE CROSS/BLUE SHIELD | Admitting: Internal Medicine

## 2021-09-16 ENCOUNTER — Encounter: Payer: Self-pay | Admitting: Internal Medicine

## 2021-09-16 VITALS — BP 122/80 | HR 65 | Ht 69.0 in | Wt 171.0 lb

## 2021-09-16 DIAGNOSIS — E118 Type 2 diabetes mellitus with unspecified complications: Secondary | ICD-10-CM | POA: Diagnosis not present

## 2021-09-16 DIAGNOSIS — E782 Mixed hyperlipidemia: Secondary | ICD-10-CM | POA: Insufficient documentation

## 2021-09-16 LAB — BASIC METABOLIC PANEL
BUN: 21 mg/dL (ref 6–23)
CO2: 30 mEq/L (ref 19–32)
Calcium: 9.5 mg/dL (ref 8.4–10.5)
Chloride: 105 mEq/L (ref 96–112)
Creatinine, Ser: 1.2 mg/dL (ref 0.40–1.50)
GFR: 63.32 mL/min (ref >60.00)
Glucose, Bld: 98 mg/dL (ref 70–99)
Potassium: 4.4 mEq/L (ref 3.5–5.1)
Sodium: 139 mEq/L (ref 135–145)

## 2021-09-16 LAB — LIPID PANEL
Cholesterol: 169 mg/dL (ref 0–200)
HDL: 54.3 mg/dL (ref >39.00)
LDL Cholesterol: 90 mg/dL (ref 0–99)
NonHDL: 114.72
Total CHOL/HDL Ratio: 3
Triglycerides: 124 mg/dL (ref 0.0–149.0)
VLDL: 24.8 mg/dL (ref 0.0–40.0)

## 2021-09-16 LAB — POCT GLYCOSYLATED HEMOGLOBIN (HGB A1C): Hemoglobin A1C: 5.6 % (ref 4.0–5.6)

## 2021-09-16 NOTE — Progress Notes (Signed)
Name: Matthew Patterson  MRN/ DOB: [id number], Apr 26, 1956   Age/ Sex: 66 y.o., male    PCP: Dorothyann Peng, NP   Reason for Endocrinology Evaluation: Type 2 Diabetes Mellitus     Date of Initial Endocrinology Visit: 09/16/2021     PATIENT IDENTIFIER: Matthew Patterson is a 66 y.o. male with a past medical history of T2DM and dyslipidemia . The patient presented for initial endocrinology clinic visit on 09/16/2021 for consultative assistance with his diabetes management.    HPI: Matthew Patterson was    Diagnosed with DM 2018 Prior Medications tried/Intolerance: Has been on Metformin for a few years . Ozempic added 06/2021 Currently checking blood sugars multiple  x / day,  through CGM Hypoglycemia episodes : yes  , its believed to be false           Symptoms: no                 Frequency: 1/ week  Hemoglobin A1c has ranged from 6.6% in 2020, peaking at 11.9% in 2022. Patient required assistance for hypoglycemia:  Patient has required hospitalization within the last 1 year from hyper or hypoglycemia:   In terms of diet, the patient 2 meals a day, snacks on fruits .    Has occasional diarrhea but no nausea or vomiting  He had made drastic dietary changes since last year.    HOME DIABETES REGIMEN: Ozempic 0.5 mg weekly  Metformin 1000 mg BID   Statin: yes ACE-I/ARB: yes     CONTINUOUS GLUCOSE MONITORING RECORD INTERPRETATION    Dates of Recording: 2/1-2/14/2023  Sensor description: freestyle libre   Results statistics:   CGM use % of time 79  Average and SD 114/22.9  Time in range  96      %  % Time Above 180 4  % Time above 250 0  % Time Below target 1   Glycemic patterns summary: Bg's optimal during the night with hyperglycemia at breakfast   Hyperglycemic episodes  with meals   Hypoglycemic episodes occurred post meals   Overnight periods: tight BG's without hypoglycemia      DIABETIC COMPLICATIONS: Microvascular complications:   Denies: CKD, neuropathy   Last eye exam: Completed 2022  Macrovascular complications:   Denies: CAD, PVD, CVA   PAST HISTORY: Past Medical History:  Past Medical History:  Diagnosis Date   Anxiety and depression    Diabetes mellitus without complication (HCC)    GERD (gastroesophageal reflux disease)    Hyperlipidemia    Past Surgical History:  Past Surgical History:  Procedure Laterality Date   ROTATOR CUFF REPAIR     SHOULDER SURGERY  2010   B/L    Social History:  reports that he has never smoked. He has never used smokeless tobacco. He reports current alcohol use of about 1.0 - 2.0 standard drink per week. He reports that he does not use drugs. Family History:  Family History  Problem Relation Age of Onset   Diabetes Mother    Hypertension Mother    Cancer Sister        uterine cancer     HOME MEDICATIONS: Allergies as of 09/16/2021   No Known Allergies      Medication List        Accurate as of September 16, 2021  8:21 AM. If you have any questions, ask your nurse or doctor.          atorvastatin 40 MG tablet Commonly known as:  LIPITOR TAKE EVERY TABLET BY MOUTH DAILY   fenofibrate 145 MG tablet Commonly known as: TRICOR Take 1 tablet (145 mg total) by mouth daily.   FreeStyle Libre 2 Sensor Misc Use with libre app   glucose blood test strip Use as instructed   lisinopril 2.5 MG tablet Commonly known as: ZESTRIL Take 1 tablet (2.5 mg total) by mouth daily.   metFORMIN 1000 MG tablet Commonly known as: GLUCOPHAGE TAKE 1 TABLET(1000 MG) BY MOUTH TWICE DAILY WITH A MEAL   onetouch ultrasoft lancets Use as instructed   OneTouch Verio IQ System w/Device Kit 1 kit   Ozempic (0.25 or 0.5 MG/DOSE) 2 MG/1.5ML Sopn Generic drug: Semaglutide(0.25 or 0.5MG/DOS) Inject 0.5 mg into the skin once a week. 0.25 mg subQ weekly x 4 weeks and then increase to 0.5 mg weekly   traZODone 100 MG tablet Commonly known as: DESYREL Take 1 tablet (100 mg total) by mouth at  bedtime.         ALLERGIES: No Known Allergies   REVIEW OF SYSTEMS: A comprehensive ROS was conducted with the patient and is negative except as per HPI   OBJECTIVE:   VITAL SIGNS: BP 122/80 (BP Location: Left Arm, Patient Position: Sitting, Cuff Size: Small)    Pulse 65    Ht 5' 9" (1.753 m)    Wt 171 lb (77.6 kg)    SpO2 99%    BMI 25.25 kg/m    PHYSICAL EXAM:  General: Pt appears well and is in NAD  Neck: General: Supple without adenopathy or carotid bruits. Thyroid: Thyroid size normal.  No goiter or nodules appreciated. No thyroid bruit.  Lungs: Clear with good BS bilat with no rales, rhonchi, or wheezes  Heart: RRR with normal S1 and S2 and no gallops; no murmurs; no rub  Abdomen: Normoactive bowel sounds, soft, nontender, without masses or organomegaly palpable  Extremities:  Lower extremities - No pretibial edema. No lesions.  Neuro: MS is good with appropriate affect, pt is alert and Ox3    DM foot exam: 09/16/2021  The skin of the feet is intact without sores or ulcerations. The pedal pulses are 2+ on right and 2+ on left. The sensation is intact to a screening 5.07, 10 gram monofilament bilaterally   DATA REVIEWED:  Lab Results  Component Value Date   HGBA1C 5.6 09/16/2021   HGBA1C 5.9 07/11/2021   HGBA1C 11.9 (H) 04/08/2021    Latest Reference Range & Units 09/16/21 07:55  Sodium 135 - 145 mEq/L 139  Potassium 3.5 - 5.1 mEq/L 4.4  Chloride 96 - 112 mEq/L 105  CO2 19 - 32 mEq/L 30  Glucose 70 - 99 mg/dL 98  BUN 6 - 23 mg/dL 21  Creatinine 0.40 - 1.50 mg/dL 1.20  Calcium 8.4 - 10.5 mg/dL 9.5  GFR >60.00 mL/min 63.32    Latest Reference Range & Units 09/16/21 07:55  Total CHOL/HDL Ratio  3  Cholesterol 0 - 200 mg/dL 169  HDL Cholesterol >39.00 mg/dL 54.30  LDL (calc) 0 - 99 mg/dL 90  MICROALB/CREAT RATIO 0.0 - 30.0 mg/g 0.8  NonHDL  114.72  Triglycerides 0.0 - 149.0 mg/dL 124.0  VLDL 0.0 - 40.0 mg/dL 24.8    Latest Reference Range & Units  09/16/21 07:55  Creatinine,U mg/dL 88.4  Microalb, Ur 0.0 - 1.9 mg/dL <0.7  MICROALB/CREAT RATIO 0.0 - 30.0 mg/g 0.8   ASSESSMENT / PLAN / RECOMMENDATIONS:   1) Type 2 Diabetes Mellitus, Optimally controlled, Without complications - Most  recent A1c of 5.6 %. Goal A1c < 7.0 %.    Plan: GENERAL: I have discussed with the patient the pathophysiology of diabetes. We went over the natural progression of the disease. We talked about both insulin resistance and insulin deficiency. We stressed the importance of lifestyle changes including diet and exercise. I explained the complications associated with diabetes including retinopathy, nephropathy, neuropathy as well as increased risk of cardiovascular disease. We went over the benefit seen with glycemic control.   I explained to the patient that diabetic patients are at higher than normal risk for amputations.  I have praised the pt on improved glycemic control and encouraged him to continue lifestyle changes  No changes today   MEDICATIONS: Continue Metformin 1000 mg BID  Continue Ozempic 0.25 mg weekly   EDUCATION / INSTRUCTIONS: BG monitoring instructions: Patient is instructed to check his blood sugars 3 times a day, before meals . Call [facility] if: BG persistently < 70 I reviewed the Rule of 15 for the treatment of hypoglycemia in detail with the patient. Literature supplied.   2) Diabetic complications:  Eye: Does not have known diabetic retinopathy.  Neuro/ Feet: Does not have known diabetic peripheral neuropathy. Renal: Patient does not have known baseline CKD. He is  on an ACEI/ARB at present.   3) Dyslipidemia :   - Tg elevated at 399 mg/dL with LDL at 111 mg/dL in9/2022 - Encouraged low fat diet and low carb diet  - Discussed increased risk of cardiovascular disease with hyperlipidemia.  - Repeat lipid panel showed improvement in Tg and LDL    Medication  Atorvastatin 40 mg daily  Fenofibrate 145 mg  daily    F/U in 4 months     Signed electronically by: Mack Guise, MD  [facility]  [facility] [address] [place], [place] [address] Phone: [phone] FAX: [phone]   CC: Dorothyann Peng, NP [place] [address] Phone: [phone]  Fax: [phone]    Return to Endocrinology clinic as below: Future Appointments  Date Time Provider [place]  10/09/2021  7:00 AM Carlisle Cater, Tommi Rumps, NP [id number]

## 2021-09-16 NOTE — Patient Instructions (Signed)
-   Keep Up the Good Work ! - Continue Metformin 1000 mg , Twice daily  - Continue Ozempic 0.5 mg weekly     HOW TO TREAT LOW BLOOD SUGARS (Blood sugar LESS THAN 70 MG/DL) Please follow the RULE OF 15 for the treatment of hypoglycemia treatment (when your (blood sugars are less than 70 mg/dL)   STEP 1: Take 15 grams of carbohydrates when your blood sugar is low, which includes:  3-4 GLUCOSE TABS  OR 3-4 OZ OF JUICE OR REGULAR SODA OR ONE TUBE OF GLUCOSE GEL    STEP 2: RECHECK blood sugar in 15 MINUTES STEP 3: If your blood sugar is still low at the 15 minute recheck --> then, go back to STEP 1 and treat AGAIN with another 15 grams of carbohydrates.

## 2021-09-17 ENCOUNTER — Other Ambulatory Visit: Payer: Self-pay

## 2021-09-17 ENCOUNTER — Telehealth: Payer: Self-pay | Admitting: Adult Health

## 2021-09-17 DIAGNOSIS — E118 Type 2 diabetes mellitus with unspecified complications: Secondary | ICD-10-CM

## 2021-09-17 LAB — MICROALBUMIN / CREATININE URINE RATIO
Creatinine,U: 88.4 mg/dL
Microalb Creat Ratio: 0.8 mg/g (ref 0.0–30.0)
Microalb, Ur: <0.7 mg/dL (ref 0.0–1.9)

## 2021-09-17 MED ORDER — OZEMPIC (0.25 OR 0.5 MG/DOSE) 2 MG/1.5ML ~~LOC~~ SOPN: 0.5000 mg | PEN_INJECTOR | SUBCUTANEOUS | 3 refills | Status: DC

## 2021-09-17 MED ORDER — LISINOPRIL 2.5 MG PO TABS: 2.5000 mg | ORAL_TABLET | Freq: Every day | ORAL | 2 refills | Status: DC

## 2021-09-17 MED ORDER — METFORMIN HCL 1000 MG PO TABS: 1000.0000 mg | ORAL_TABLET | Freq: Two times a day (BID) | ORAL | 3 refills | Status: DC

## 2021-09-17 MED ORDER — ATORVASTATIN CALCIUM 40 MG PO TABS: ORAL_TABLET | ORAL | 2 refills | Status: DC

## 2021-09-17 NOTE — Telephone Encounter (Signed)
Rx refilled and sent to updated pharmacy.

## 2021-09-17 NOTE — Telephone Encounter (Signed)
Pt still has refills on file. Will call pt to see if he has checked with his pharmacy.

## 2021-09-17 NOTE — Telephone Encounter (Signed)
Pt notified of upate

## 2021-09-17 NOTE — Telephone Encounter (Signed)
Patient needs a refill on atorvastatin (LIPITOR) 40 MG tablet  lisinopril (ZESTRIL) 2.5 MG tablet   Please send to  [facility] [id number] - [place], [address] Phone:  [phone]  Fax:  [phone]        Please advise

## 2021-10-04 ENCOUNTER — Other Ambulatory Visit: Payer: Self-pay | Admitting: Adult Health

## 2021-10-04 DIAGNOSIS — G4700 Insomnia, unspecified: Secondary | ICD-10-CM

## 2021-10-09 ENCOUNTER — Ambulatory Visit: Payer: BLUE CROSS/BLUE SHIELD | Admitting: Internal Medicine

## 2021-10-09 ENCOUNTER — Encounter: Payer: BLUE CROSS/BLUE SHIELD | Admitting: Adult Health

## 2021-10-09 NOTE — Progress Notes (Deleted)
? ?  Subjective:  ? ? Patient ID: Matthew Patterson, male    DOB: 11-03-1955, 66 y.o.   MRN: [id number] ? ?HPI ? ? ? ?Review of Systems ? ?   ?Objective:  ? Physical Exam ? ? ? ? ?   ?Assessment & Plan:  ? ? ?

## 2021-10-10 NOTE — Progress Notes (Signed)
Opened in error

## 2021-10-16 ENCOUNTER — Telehealth: Payer: Self-pay

## 2021-10-16 NOTE — Telephone Encounter (Signed)
Patient has been having some nausea, diarrhea, fatigue. Thinks it may be due to medication. Patient has upcoming appt on 10/28/21. Would like to know what he can do until that time.  ?

## 2021-10-17 NOTE — Telephone Encounter (Signed)
Patient notified and verbalized understanding. 

## 2021-10-28 ENCOUNTER — Encounter: Payer: Self-pay | Admitting: Internal Medicine

## 2021-10-28 ENCOUNTER — Ambulatory Visit: Payer: BLUE CROSS/BLUE SHIELD | Admitting: Internal Medicine

## 2021-10-28 ENCOUNTER — Other Ambulatory Visit: Payer: Self-pay

## 2021-10-28 VITALS — BP 110/72 | HR 64 | Ht 69.0 in | Wt 170.6 lb

## 2021-10-28 DIAGNOSIS — E785 Hyperlipidemia, unspecified: Secondary | ICD-10-CM | POA: Diagnosis not present

## 2021-10-28 DIAGNOSIS — E119 Type 2 diabetes mellitus without complications: Secondary | ICD-10-CM | POA: Diagnosis not present

## 2021-10-28 MED ORDER — METFORMIN HCL 1000 MG PO TABS: 1000.0000 mg | ORAL_TABLET | Freq: Two times a day (BID) | ORAL | 3 refills | Status: DC

## 2021-10-28 NOTE — Progress Notes (Signed)
?Name: Matthew Patterson  ?MRN/ DOB: [id number], 1956-01-09   ?Age/ Sex: 66 y.o., male   ? ?PCP: Dorothyann Peng, NP   ?Reason for Endocrinology Evaluation: Type 2 Diabetes Mellitus  ?   ?Date of Initial Endocrinology Visit: 09/16/2021  ? ? ?PATIENT IDENTIFIER: Matthew Patterson is a 66 y.o. male with a past medical history of T2DM and dyslipidemia . The patient presented for initial endocrinology clinic visit on 09/16/2021 for consultative assistance with his diabetes management.  ? ? ?HPI: ?Matthew Patterson was  ? ? ?Diagnosed with DM 2018 ?Prior Medications tried/Intolerance: Has been on Metformin for a few years . Ozempic added 06/2021 ?Hemoglobin A1c has ranged from 6.6% in 2020, peaking at 11.9% in 2022. ? ?On his initial visit to our clinic his A1c was 5.6%, he was already on metformin and Ozempic and we did not make any changes ? ?SUBJECTIVE:  ? ?During the last visit (09/16/2021): A1c 5.6%, continue metformin and Ozempic ? ?Today (10/28/21): Matthew Patterson  is here for follow-up on diabetes management.  He checks his blood sugars multiple  times daily, through [employer]. The patient has  had hypoglycemic episodes since the last clinic visit, which typically occur 1 x /week - most often occuring at night . The patient is not symptomatic with these episodes ? ? ?Since his last visit here he contacted our office with fatigue, nausea, and diarrhea he was asked to hold Ozempic and continue metformin in the meantime.  Of note the patient has been on Ozempic for~4 months ? ? ?Nausea and diarrhea have improved  ? ? ?HOME DIABETES REGIMEN: ?Ozempic 0.25 mg weekly -on hold ?Metformin 1000 mg BID ? ? ? ? ? ? ? ?Statin: yes ?ACE-I/ARB: yes ? ? ? ? ?CONTINUOUS GLUCOSE MONITORING RECORD INTERPRETATION   ? ?Dates of Recording: 3/15-3/28/2023 ? ?Sensor description: freestyle libre  ? ?Results statistics: ?  ?CGM use % of time 90  ?Average and SD 114/26.9  ?Time in range  94    %  ?% Time Above 180 5  ?% Time above 250 0  ?% Time  Below target 1  ? ?Glycemic patterns summary: Bg's optimal during the night with hyperglycemia at breakfast  ? ?Hyperglycemic episodes  with meals  ? ?Hypoglycemic episodes occurred at night  ? ?Overnight periods: variable  ? ? ? ? ?DIABETIC COMPLICATIONS: ?Microvascular complications:  ? ?Denies: CKD, neuropathy  ?Last eye exam: Completed 2022 ? ?Macrovascular complications:  ? ?Denies: CAD, PVD, CVA ? ? ?PAST HISTORY: ?Past Medical History:  ?Past Medical History:  ?Diagnosis Date  ? Anxiety and depression   ? Diabetes mellitus without complication ([place])   ? GERD (gastroesophageal reflux disease)   ? Hyperlipidemia   ? ?Past Surgical History:  ?Past Surgical History:  ?Procedure Laterality Date  ? ROTATOR CUFF REPAIR    ? SHOULDER SURGERY  2010  ? B/L  ?  ?Social History:  reports that he has never smoked. He has never used smokeless tobacco. He reports current alcohol use of about 1.0 - 2.0 standard drink per week. He reports that he does not use drugs. ?Family History:  ?Family History  ?Problem Relation Age of Onset  ? Diabetes Mother   ? Hypertension Mother   ? Cancer Sister   ?     uterine cancer  ? ? ? ?HOME MEDICATIONS: ?Allergies as of 10/28/2021   ?No Known Allergies ?  ? ?  ?Medication List  ?  ? ?  ? Accurate  as of October 28, 2021  3:19 PM. If you have any questions, ask your nurse or doctor.  ?  ?  ? ?  ? ?STOP taking these medications   ? ?Ozempic (0.25 or 0.5 MG/DOSE) 2 MG/1.5ML Sopn ?Generic drug: Semaglutide(0.25 or 0.5MG/DOS) ?Stopped by: Dorita Sciara, MD ?  ? ?  ? ?TAKE these medications   ? ?atorvastatin 40 MG tablet ?Commonly known as: LIPITOR ?TAKE EVERY TABLET BY MOUTH DAILY ?  ?fenofibrate 145 MG tablet ?Commonly known as: TRICOR ?Take 1 tablet (145 mg total) by mouth daily. ?  ?FreeStyle Libre 2 Sensor Misc ?Use with libre app ?  ?glucose blood test strip ?Use as instructed ?  ?lisinopril 2.5 MG tablet ?Commonly known as: ZESTRIL ?Take 1 tablet (2.5 mg total) by mouth daily. ?   ?metFORMIN 1000 MG tablet ?Commonly known as: GLUCOPHAGE ?Take 1 tablet (1,000 mg total) by mouth 2 (two) times daily with a meal. ?  ?onetouch ultrasoft lancets ?Use as instructed ?  ?OneTouch Verio IQ System w/Device Kit ?1 kit ?  ?traZODone 100 MG tablet ?Commonly known as: DESYREL ?Take 1 tablet (100 mg total) by mouth at bedtime. ?  ? ?  ? ? ? ?ALLERGIES: ?No Known Allergies ? ? ?REVIEW OF SYSTEMS: ?A comprehensive ROS was conducted with the patient and is negative except as per HPI ?  ?OBJECTIVE:  ? ?VITAL SIGNS: BP 110/72 (BP Location: Left Arm, Patient Position: Sitting, Cuff Size: Small)   Pulse 64   Ht 5' 9"  (1.753 m)   Wt 170 lb 9.6 oz (77.4 kg)   SpO2 98%   BMI 25.19 kg/m?   ? ?PHYSICAL EXAM:  ?General: Pt appears well and is in NAD  ?Neck: General: Supple without adenopathy or carotid bruits. ?Thyroid: Thyroid size normal.  No goiter or nodules appreciated. No thyroid bruit.  ?Lungs: Clear with good BS bilat with no rales, rhonchi, or wheezes  ?Heart: RRR with normal S1 and S2 and no gallops; no murmurs; no rub  ?Abdomen: Normoactive bowel sounds, soft, nontender, without masses or organomegaly palpable  ?Extremities:  ?Lower extremities - No pretibial edema. No lesions.  ?Neuro: MS is good with appropriate affect, pt is alert and Ox3  ? ? ?DM foot exam: 09/16/2021 ? ?The skin of the feet is intact without sores or ulcerations. ?The pedal pulses are 2+ on right and 2+ on left. ?The sensation is intact to a screening 5.07, 10 gram monofilament bilaterally ? ? ? ? ? ?DATA REVIEWED: ? ?Lab Results  ?Component Value Date  ? HGBA1C 5.6 09/16/2021  ? HGBA1C 5.9 07/11/2021  ? HGBA1C 11.9 (H) 04/08/2021  ? ? Latest Reference Range & Units 09/16/21 07:55  ?Sodium 135 - 145 mEq/L 139  ?Potassium 3.5 - 5.1 mEq/L 4.4  ?Chloride 96 - 112 mEq/L 105  ?CO2 19 - 32 mEq/L 30  ?Glucose 70 - 99 mg/dL 98  ?BUN 6 - 23 mg/dL 21  ?Creatinine 0.40 - 1.50 mg/dL 1.20  ?Calcium 8.4 - 10.5 mg/dL 9.5  ?GFR >60.00 mL/min 63.32   ? ? Latest Reference Range & Units 09/16/21 07:55  ?Total CHOL/HDL Ratio  3  ?Cholesterol 0 - 200 mg/dL 169  ?HDL Cholesterol >39.00 mg/dL 54.30  ?LDL (calc) 0 - 99 mg/dL 90  ?MICROALB/CREAT RATIO 0.0 - 30.0 mg/g 0.8  ?NonHDL  114.72  ?Triglycerides 0.0 - 149.0 mg/dL 124.0  ?VLDL 0.0 - 40.0 mg/dL 24.8  ? ? Latest Reference Range & Units 09/16/21 07:55  ?Creatinine,U  mg/dL 88.4  ?Microalb, Ur 0.0 - 1.9 mg/dL <0.7  ?MICROALB/CREAT RATIO 0.0 - 30.0 mg/g 0.8  ? ?ASSESSMENT / PLAN / RECOMMENDATIONS:  ? ?1) Type 2 Diabetes Mellitus, Optimally controlled, Without complications - Most recent A1c of 5.6 %. Goal A1c < 7.0 %.   ? ?-His A1c remains optimal ?-We held Ozempic due to nausea and diarrhea, the symptoms have been improving and is only been a week ?-I have asked him to confirm any hypoglycemic episodes on freestyle libre with a fingerstick ?-We will not restart Ozempic and continue metformin as below ? ? ? ?MEDICATIONS: ?Continue Metformin 1000 mg BID  ?Stop Ozempic ? ?EDUCATION / INSTRUCTIONS: ?BG monitoring instructions: Patient is instructed to check his blood sugars 3 times a day, before meals . ?Call [facility] if: BG persistently < 70 ?I reviewed the Rule of 15 for the treatment of hypoglycemia in detail with the patient. Literature supplied. ? ? ?2) Diabetic complications:  ?Eye: Does not have known diabetic retinopathy.  ?Neuro/ Feet: Does not have known diabetic peripheral neuropathy. ?Renal: Patient does not have known baseline CKD. He is  on an ACEI/ARB at present. ? ? ?3) Dyslipidemia :  ? ?- Tg elevated at 399 mg/dL with LDL at 111 mg/dL in 04/2021 but his LDL has improved to 90 mg/DL and TG is normal at 124 NG/DL by 09/2021 ? ? ?Medication  ?Continue atorvastatin 40 mg daily  ?Continue fenofibrate 145 mg daily  ? ? ?F/U in 6 months  ?  ? ?Signed electronically by: ?Abby Nena Jordan, MD ? ?[facility]  ?[facility] ?[address] ?[place],  [place] [address] ?Phone: [phone] ?FAX: [phone]  ? ?CC: ?Dorothyann Peng, NP ?[place] ?[place] [address] ?Phone: [phone]  ?Fax: [phone] ? ? ? ?Return to Endocrinology clinic as below: ?

## 2021-10-28 NOTE — Patient Instructions (Signed)
?-   Continue Metformin 1000 mg , Twice daily  ?- Stop Ozempic  ? ? ? ?HOW TO TREAT LOW BLOOD SUGARS (Blood sugar LESS THAN 70 MG/DL) ?Please follow the RULE OF 15 for the treatment of hypoglycemia treatment (when your (blood sugars are less than 70 mg/dL)  ? ?STEP 1: Take 15 grams of carbohydrates when your blood sugar is low, which includes:  ?3-4 GLUCOSE TABS  OR ?3-4 OZ OF JUICE OR REGULAR SODA OR ?ONE TUBE OF GLUCOSE GEL   ? ?STEP 2: RECHECK blood sugar in 15 MINUTES ?STEP 3: If your blood sugar is still low at the 15 minute recheck --> then, go back to STEP 1 and treat AGAIN with another 15 grams of carbohydrates. ? ?

## 2021-11-28 ENCOUNTER — Telehealth: Payer: Self-pay | Admitting: Internal Medicine

## 2021-11-28 NOTE — Telephone Encounter (Signed)
Spoke to patient's wife/20/2023.  Patient has been noted with hypoglycemia on freestyle libre with a BG of 69 mg/DL, this was confirmed with a fingerstick of 62 mg/DL ? ? ?Patient currently on metformin, this was discontinued today ? ? ?Mack Guise, MD ? ?[facility]  ?[facility] ?[address] ?[place], [place] [address] ?Phone: [phone] ?FAX: [phone] ? ?

## 2021-12-16 DIAGNOSIS — L814 Other melanin hyperpigmentation: Secondary | ICD-10-CM | POA: Diagnosis not present

## 2021-12-16 DIAGNOSIS — D0359 Melanoma in situ of other part of trunk: Secondary | ICD-10-CM | POA: Diagnosis not present

## 2021-12-16 DIAGNOSIS — L821 Other seborrheic keratosis: Secondary | ICD-10-CM | POA: Diagnosis not present

## 2022-01-08 DIAGNOSIS — D225 Melanocytic nevi of trunk: Secondary | ICD-10-CM | POA: Diagnosis not present

## 2022-01-08 DIAGNOSIS — D0359 Melanoma in situ of other part of trunk: Secondary | ICD-10-CM | POA: Diagnosis not present

## 2022-01-20 ENCOUNTER — Encounter: Payer: Self-pay | Admitting: Internal Medicine

## 2022-01-20 ENCOUNTER — Ambulatory Visit: Payer: BLUE CROSS/BLUE SHIELD | Admitting: Internal Medicine

## 2022-01-20 VITALS — BP 110/76 | HR 63 | Ht 69.0 in | Wt 172.6 lb

## 2022-01-20 DIAGNOSIS — E119 Type 2 diabetes mellitus without complications: Secondary | ICD-10-CM | POA: Diagnosis not present

## 2022-01-20 LAB — POCT GLYCOSYLATED HEMOGLOBIN (HGB A1C): Hemoglobin A1C: 5.8 % — AB (ref 4.0–5.6)

## 2022-01-20 NOTE — Progress Notes (Signed)
Name: Matthew Patterson  MRN/ DOB: [id number], 03/09/1956   Age/ Sex: 66 y.o., male    PCP: Dorothyann Peng, NP   Reason for Endocrinology Evaluation: Type 2 Diabetes Mellitus     Date of Initial Endocrinology Visit: 09/16/2021    PATIENT IDENTIFIER: Matthew Patterson is a 66 y.o. male with a past medical history of T2DM and dyslipidemia . The patient presented for initial endocrinology clinic visit on 09/16/2021 for consultative assistance with his diabetes management.    HPI: [name] was    Diagnosed with DM 2018 Prior Medications tried/Intolerance: Has been on Metformin for a few years . Ozempic added 06/2021 Hemoglobin A1c has ranged from 6.6% in 2020, peaking at 11.9% in 2022.  On his initial visit to our clinic his A1c was 5.6%, he was already on metformin and Ozempic and we did not make any changes  Ozempic stopped 10/2021 with an A1c 5.6%    Metformin discontinued 10/2021 with hypoglycemia on freestyle libre and finger stick of 62 mg/dL   SUBJECTIVE:   During the last visit (10/28/2021): A1c 5.6%, continue metformin and Ozempic     Today (01/20/22): [name]  is here for follow-up on diabetes management.  He checks his blood sugars multiple  times daily, through [employer]. The patient has  had hypoglycemic episodes since the last clinic visit, which typically occur 1 x /week - most often occuring at night . The patient is not symptomatic with these episodes   Denies nausea vomiting or diarrhea  Has been taking occasional metformin with postprandial hyperglycemia     HOME DIABETES REGIMEN: N/A         Statin: yes ACE-I/ARB: yes     CONTINUOUS GLUCOSE MONITORING RECORD INTERPRETATION    Dates of Recording: 6/7-6/20/2023  Sensor description: freestyle libre   Results statistics:   CGM use % of time 64  Average and SD 116/29.2  Time in range 93  %  % Time Above 180 5  % Time above 250 1  % Time Below target 1   Glycemic patterns  summary: Bg's optimal during the day and night   Hyperglycemic episodes  with meals   Hypoglycemic episodes  at night and early evening   Overnight periods: optimal      DIABETIC COMPLICATIONS: Microvascular complications:   Denies: CKD, neuropathy  Last eye exam: Completed 2022  Macrovascular complications:   Denies: CAD, PVD, CVA   PAST HISTORY: Past Medical History:  Past Medical History:  Diagnosis Date   Anxiety and depression    Diabetes mellitus without complication ([place])    GERD (gastroesophageal reflux disease)    Hyperlipidemia    Past Surgical History:  Past Surgical History:  Procedure Laterality Date   ROTATOR CUFF REPAIR     SHOULDER SURGERY  2010   B/L    Social History:  reports that he has never smoked. He has never used smokeless tobacco. He reports current alcohol use of about 1.0 - 2.0 standard drink of alcohol per week. He reports that he does not use drugs. Family History:  Family History  Problem Relation Age of Onset   Diabetes Mother    Hypertension Mother    Cancer Sister        uterine cancer     HOME MEDICATIONS: Allergies as of 01/20/2022   No Known Allergies      Medication List        Accurate as of January 20, 2022  6:53 AM.  If you have any questions, ask your nurse or doctor.          atorvastatin 40 MG tablet Commonly known as: LIPITOR TAKE EVERY TABLET BY MOUTH DAILY   fenofibrate 145 MG tablet Commonly known as: TRICOR Take 1 tablet (145 mg total) by mouth daily.   FreeStyle Libre 2 Sensor Misc Use with libre app   glucose blood test strip Use as instructed   lisinopril 2.5 MG tablet Commonly known as: ZESTRIL Take 1 tablet (2.5 mg total) by mouth daily.   onetouch ultrasoft lancets Use as instructed   OneTouch Verio IQ System w/Device Kit 1 kit   traZODone 100 MG tablet Commonly known as: DESYREL Take 1 tablet (100 mg total) by mouth at bedtime.         ALLERGIES: No Known  Allergies   REVIEW OF SYSTEMS: A comprehensive ROS was conducted with the patient and is negative except as per HPI   OBJECTIVE:   VITAL SIGNS: There were no vitals taken for this visit.   PHYSICAL EXAM:  General: Pt appears well and is in NAD  Neck: General: Supple without adenopathy or carotid bruits. Thyroid: Thyroid size normal.  No goiter or nodules appreciated. No thyroid bruit.  Lungs: Clear with good BS bilat with no rales, rhonchi, or wheezes  Heart: RRR with normal S1 and S2 and no gallops; no murmurs; no rub  Abdomen: Normoactive bowel sounds, soft, nontender, without masses or organomegaly palpable  Extremities:  Lower extremities - No pretibial edema. No lesions.  Neuro: MS is good with appropriate affect, pt is alert and Ox3    DM foot exam: 09/16/2021  The skin of the feet is intact without sores or ulcerations. The pedal pulses are 2+ on right and 2+ on left. The sensation is intact to a screening 5.07, 10 gram monofilament bilaterally      DATA REVIEWED:  Lab Results  Component Value Date   HGBA1C 5.6 09/16/2021   HGBA1C 5.9 07/11/2021   HGBA1C 11.9 (H) 04/08/2021    Latest Reference Range & Units 09/16/21 07:55  Sodium 135 - 145 mEq/L 139  Potassium 3.5 - 5.1 mEq/L 4.4  Chloride 96 - 112 mEq/L 105  CO2 19 - 32 mEq/L 30  Glucose 70 - 99 mg/dL 98  BUN 6 - 23 mg/dL 21  Creatinine 0.40 - 1.50 mg/dL 1.20  Calcium 8.4 - 10.5 mg/dL 9.5  GFR >60.00 mL/min 63.32    Latest Reference Range & Units 09/16/21 07:55  Total CHOL/HDL Ratio  3  Cholesterol 0 - 200 mg/dL 169  HDL Cholesterol >39.00 mg/dL 54.30  LDL (calc) 0 - 99 mg/dL 90  MICROALB/CREAT RATIO 0.0 - 30.0 mg/g 0.8  NonHDL  114.72  Triglycerides 0.0 - 149.0 mg/dL 124.0  VLDL 0.0 - 40.0 mg/dL 24.8    Latest Reference Range & Units 09/16/21 07:55  Creatinine,U mg/dL 88.4  Microalb, Ur 0.0 - 1.9 mg/dL <0.7  MICROALB/CREAT RATIO 0.0 - 30.0 mg/g 0.8   ASSESSMENT / PLAN / RECOMMENDATIONS:   1)  Type 2 Diabetes Mellitus, Optimally controlled, Without complications - Most recent A1c of 5.8 %. Goal A1c < 7.0 %.    -His A1c remains optimal -We held Ozempic due to nausea and diarrhea - I had stopped his metformin in 10/2021 based on hypoglycemic episodes on freestyle libre and finger stick . Today he tells me he has been using metformin occasional for post prandial hyperglycemia, which I have advised him against doing that especially  with hypoglycemia on CGM - We discussed using CGM data to adjust his eating habits and limit CHO intake    MEDICATIONS: N/A   EDUCATION / INSTRUCTIONS: BG monitoring instructions: Patient is instructed to check his blood sugars 3 times a day, before meals . Call [facility] if: BG persistently < 70 I reviewed the Rule of 15 for the treatment of hypoglycemia in detail with the patient. Literature supplied.   2) Diabetic complications:  Eye: Does not have known diabetic retinopathy.  Neuro/ Feet: Does not have known diabetic peripheral neuropathy. Renal: Patient does not have known baseline CKD. He is  on an ACEI/ARB at present.   3) Dyslipidemia :   - Tg elevated at 399 mg/dL with LDL at 111 mg/dL in 04/2021 but his LDL has improved to 90 mg/DL and TG is normal at 124 NG/DL by 09/2021   Medication  Continue atorvastatin 40 mg daily  Continue fenofibrate 145 mg daily     4) Hypoglycemia :  - This is mainly seen on CGM download. Pt noted this also occurs on the day that Metformin is not taken. I  have advised the pt that we need to eliminate Metformin from this and to also confirm each hypoglycemic episode with a fingerstick and to bring the meter on his next visit  - He has no Hx of gastric sx  - We may have to work him up for hypoglycemia if this is confirmed    F/U in 4 months     Signed electronically by: Mack Guise, MD  [facility]  [facility] [address] [place], [place] [address] Phone: [phone] FAX: [phone]   CC: Dorothyann Peng, NP [address] [place] [address] Phone: [phone]  Fax: [phone]    Return to Endocrinology clinic as below: Future Appointments  Date Time Provider [place]  01/20/2022  7:30 AM Romond Pipkins, Melanie Crazier, MD [facility] None  04/09/2022  7:00 AM Dorothyann Peng, NP [facility]

## 2022-02-05 ENCOUNTER — Ambulatory Visit: Payer: BLUE CROSS/BLUE SHIELD | Admitting: Family Medicine

## 2022-02-05 ENCOUNTER — Encounter: Payer: Self-pay | Admitting: Family Medicine

## 2022-02-05 VITALS — BP 118/68 | HR 51 | Temp 98.1°F | Wt 176.8 lb

## 2022-02-05 DIAGNOSIS — H66003 Acute suppurative otitis media without spontaneous rupture of ear drum, bilateral: Secondary | ICD-10-CM | POA: Diagnosis not present

## 2022-02-05 DIAGNOSIS — H9312 Tinnitus, left ear: Secondary | ICD-10-CM | POA: Diagnosis not present

## 2022-02-05 DIAGNOSIS — R040 Epistaxis: Secondary | ICD-10-CM

## 2022-02-05 MED ORDER — AMOXICILLIN-POT CLAVULANATE 500-125 MG PO TABS: 1.0000 | ORAL_TABLET | Freq: Two times a day (BID) | ORAL | 0 refills | Status: AC

## 2022-02-05 NOTE — Progress Notes (Signed)
Subjective:    Patient ID: Matthew Patterson, male    DOB: [birth year], 66 y.o.   MRN: [id number]  Chief Complaint  Patient presents with   Ear Pain    Pt c/o of ear pain on both ear. Pain worse on L side. Added when it first happened, felt dizzy. Pt reports it happened on Monday 7/3 went for swimming.    HPI Patient was seen today for acute concern.  Endorses bilateral ears feeling clogged and initially painful after swimming on Monday.  Patient states ears remain clogged.  Patient endorses history tenderness in left ear x yrs.  Patient also notes frequent epistaxis.  States may wake him up in the middle of the night.  Not currently on blood thinner or using any allergy nasal sprays.  Past Medical History:  Diagnosis Date   Anxiety and depression    Diabetes mellitus without complication ([facility])    GERD (gastroesophageal reflux disease)    Hyperlipidemia     No Known Allergies  ROS General: Denies fever, chills, night sweats, changes in weight, changes in appetite HEENT: Denies headaches, ear pain, changes in vision, rhinorrhea, sore throat + bilateral ears clogged, epistaxis, tinnitus CV: Denies CP, palpitations, SOB, orthopnea Pulm: Denies SOB, cough, wheezing GI: Denies abdominal pain, nausea, vomiting, diarrhea, constipation GU: Denies dysuria, hematuria, frequency Msk: Denies muscle cramps, joint pains Neuro: Denies weakness, numbness, tingling Skin: Denies rashes, bruising Psych: Denies depression, anxiety, hallucinations      Objective:    Blood pressure 118/68, pulse (!) 51, temperature 98.1 F (36.7 C), temperature source Oral, weight 176 lb 12.8 oz (80.2 kg), SpO2 99 %.  Gen. Pleasant, well-nourished, in no distress, normal affect   HEENT: [place]/AT, face symmetric, conjunctiva clear, no scleral icterus, PERRLA, EOMI, nares patent without drainage, left TM full with suppurative fluid and erythema, hemorrhage noted.  Right ear dull, gray with mild erythema and scant  suppurative fluid forming in upper right.  No discomfort with movement of external ears.   Lungs: no accessory muscle use Cardiovascular: RRR, no peripheral edema Musculoskeletal: No deformities, no cyanosis or clubbing, normal tone Neuro:  A&Ox3, CN II-XII intact, normal gait Skin:  Warm, no lesions/ rash   Wt Readings from Last 3 Encounters:  02/05/22 176 lb 12.8 oz (80.2 kg)  01/20/22 172 lb 9.6 oz (78.3 kg)  10/28/21 170 lb 9.6 oz (77.4 kg)    Lab Results  Component Value Date   WBC 4.9 04/08/2021   HGB 16.1 04/08/2021   HCT 46.2 04/08/2021   PLT 220.0 04/08/2021   GLUCOSE 98 09/16/2021   CHOL 169 09/16/2021   TRIG 124.0 09/16/2021   HDL 54.30 09/16/2021   LDLDIRECT 156.0 04/08/2021   LDLCALC 90 09/16/2021   ALT 21 04/08/2021   AST 12 04/08/2021   NA 139 09/16/2021   K 4.4 09/16/2021   CL 105 09/16/2021   CREATININE 1.20 09/16/2021   BUN 21 09/16/2021   CO2 30 09/16/2021   TSH 2.33 04/08/2021   PSA 0.61 04/08/2021   HGBA1C 5.8 (A) 01/20/2022   MICROALBUR <0.7 09/16/2021    Assessment/Plan:  Acute suppurative otitis media of both ears without spontaneous rupture of tympanic membranes, recurrence not specified -start abx -Tylenol or NSAIDs as needed for discomfort.  Consider saline nasal rinse or allergy pill for eustachian tube dysfunction.  - Plan: amoxicillin-clavulanate (AUGMENTIN) 500-125 MG tablet  Tinnitus of left ear -Chronic -Discussed various causes -Discussed may not be able to stop symptoms.  Can use techniques  to drown out the noise if the cough is bothersome especially at night -Given handout -Consider ENT follow-up  Epistaxis -New/ongoing -Discussed using saline nasal rinse, humidifier, or OTC A[place] to help with symptoms -Given handout -Continue follow-up PCP -Avoid nasal sprays with steroids as may cause worsening symptoms.  F/u as needed  Grier Mitts, MD

## 2022-02-05 NOTE — Patient Instructions (Signed)
You can use Ayr nasal gel to provide moisture to the inside of your nose to help prevent nosebleeds or you can use saline nasal rinse which can both be found over-the-counter at your local drugstore.

## 2022-02-18 DIAGNOSIS — D485 Neoplasm of uncertain behavior of skin: Secondary | ICD-10-CM | POA: Diagnosis not present

## 2022-02-18 DIAGNOSIS — L988 Other specified disorders of the skin and subcutaneous tissue: Secondary | ICD-10-CM | POA: Diagnosis not present

## 2022-02-26 ENCOUNTER — Ambulatory Visit: Payer: BLUE CROSS/BLUE SHIELD | Admitting: Family Medicine

## 2022-02-26 ENCOUNTER — Encounter: Payer: Self-pay | Admitting: Family Medicine

## 2022-02-26 VITALS — BP 110/80 | HR 52 | Temp 98.1°F | Wt 177.2 lb

## 2022-02-26 DIAGNOSIS — R58 Hemorrhage, not elsewhere classified: Secondary | ICD-10-CM

## 2022-02-26 LAB — COMPREHENSIVE METABOLIC PANEL
ALT: 19 U/L (ref 0–53)
AST: 18 U/L (ref 0–37)
Albumin: 4.2 g/dL (ref 3.5–5.2)
Alkaline Phosphatase: 27 U/L — ABNORMAL LOW (ref 39–117)
BUN: 27 mg/dL — ABNORMAL HIGH (ref 6–23)
CO2: 28 mEq/L (ref 19–32)
Calcium: 9.2 mg/dL (ref 8.4–10.5)
Chloride: 105 mEq/L (ref 96–112)
Creatinine, Ser: 1.26 mg/dL (ref 0.40–1.50)
GFR: 59.53 mL/min — ABNORMAL LOW (ref >60.00)
Glucose, Bld: 115 mg/dL — ABNORMAL HIGH (ref 70–99)
Potassium: 4.3 mEq/L (ref 3.5–5.1)
Sodium: 140 mEq/L (ref 135–145)
Total Bilirubin: 0.9 mg/dL (ref 0.2–1.2)
Total Protein: 6.8 g/dL (ref 6.0–8.3)

## 2022-02-26 LAB — CBC WITH DIFFERENTIAL/PLATELET
Basophils Absolute: 0.1 10*3/uL (ref 0.0–0.1)
Basophils Relative: 1 % (ref 0.0–3.0)
Eosinophils Absolute: 0.2 10*3/uL (ref 0.0–0.7)
Eosinophils Relative: 4.1 % (ref 0.0–5.0)
HCT: 43.2 % (ref 39.0–52.0)
Hemoglobin: 14.5 g/dL (ref 13.0–17.0)
Lymphocytes Relative: 18.3 % (ref 12.0–46.0)
Lymphs Abs: 1 10*3/uL (ref 0.7–4.0)
MCHC: 33.6 g/dL (ref 30.0–36.0)
MCV: 94.6 fl (ref 78.0–100.0)
Monocytes Absolute: 0.3 10*3/uL (ref 0.1–1.0)
Monocytes Relative: 6 % (ref 3.0–12.0)
Neutro Abs: 4 10*3/uL (ref 1.4–7.7)
Neutrophils Relative %: 70.6 % (ref 43.0–77.0)
Platelets: 201 10*3/uL (ref 150.0–400.0)
RBC: 4.57 Mil/uL (ref 4.22–5.81)
RDW: 13.1 % (ref 11.5–15.5)
WBC: 5.7 10*3/uL (ref 4.0–10.5)

## 2022-02-26 LAB — T4, FREE: Free T4: 0.73 ng/dL (ref 0.60–1.60)

## 2022-02-26 LAB — TSH: TSH: 1.4 u[IU]/mL (ref 0.35–5.50)

## 2022-02-26 NOTE — Progress Notes (Signed)
Subjective:    Patient ID: Matthew Patterson, male    DOB: [birth year], 66 y.o.   MRN: [id number]  Chief Complaint  Patient presents with   Bleeding/Bruising    Notice it two weeks ago. On both arms. Patient is not sure how he got the bruise. Denied injury.     HPI Patient was seen today for acute concern.  Patient seen by Dorothyann Peng, NP.  Endorses bruising on both arms 2 weeks ago.  Denies injury.  Not currently on blood thinner.  Denies new over-the-counter supplement or foods.  Has pictures of several circular areas of ecchymosis on right forearm now resolved.  Currently with area of ecchymosis on left forearm.  Patient notes taking more Advil in the last few weeks status post skin lesion removal x2 (May/June in early July).  Patient states he was taking 4 Advil per day.  Past Medical History:  Diagnosis Date   Anxiety and depression    Diabetes mellitus without complication ([facility])    GERD (gastroesophageal reflux disease)    Hyperlipidemia     No Known Allergies  ROS General: Denies fever, chills, night sweats, changes in weight, changes in appetite HEENT: Denies headaches, ear pain, changes in vision, rhinorrhea, sore throat CV: Denies CP, palpitations, SOB, orthopnea Pulm: Denies SOB, cough, wheezing GI: Denies abdominal pain, nausea, vomiting, diarrhea, constipation GU: Denies dysuria, hematuria, frequency Msk: Denies muscle cramps, joint pains Neuro: Denies weakness, numbness, tingling Skin: Denies rashes +bruising  Psych: Denies depression, anxiety, hallucinations       Objective:    Blood pressure 110/80, pulse (!) 52, temperature 98.1 F (36.7 C), temperature source Oral, weight 177 lb 3.2 oz (80.4 kg), SpO2 98 %.  Gen. Pleasant, well-nourished, in no distress, normal affect   HEENT: /AT, face symmetric, conjunctiva clear, no scleral icterus, PERRLA, EOMI, nares patent without drainage Lungs: no accessory muscle use Cardiovascular: RRR, no peripheral  edema Musculoskeletal: No deformities, no cyanosis or clubbing, normal tone Neuro:  A&Ox3, CN II-XII intact, normal gait Skin:  Warm, dry, intact.  Flat, circular area of ecchymosis on right anterior forearm.  Wt Readings from Last 3 Encounters:  02/26/22 177 lb 3.2 oz (80.4 kg)  02/05/22 176 lb 12.8 oz (80.2 kg)  01/20/22 172 lb 9.6 oz (78.3 kg)    Lab Results  Component Value Date   WBC 4.9 04/08/2021   HGB 16.1 04/08/2021   HCT 46.2 04/08/2021   PLT 220.0 04/08/2021   GLUCOSE 98 09/16/2021   CHOL 169 09/16/2021   TRIG 124.0 09/16/2021   HDL 54.30 09/16/2021   LDLDIRECT 156.0 04/08/2021   LDLCALC 90 09/16/2021   ALT 21 04/08/2021   AST 12 04/08/2021   NA 139 09/16/2021   K 4.4 09/16/2021   CL 105 09/16/2021   CREATININE 1.20 09/16/2021   BUN 21 09/16/2021   CO2 30 09/16/2021   TSH 2.33 04/08/2021   PSA 0.61 04/08/2021   HGBA1C 5.8 (A) 01/20/2022   MICROALBUR <0.7 09/16/2021    Assessment/Plan:  Ecchymosis -Discussed various causes including medications, thyroid dysfunction, liver dysfunction, vitamin deficiency, vasculitis, etc. -Will obtain labs -Not currently on blood thinner. -Will have patient follow-up with PCP in the next few weeks for continued symptoms. -Given strict precautions  - Plan: CBC with Differential/Platelet, TSH, T4, Free, Comprehensive metabolic panel, POCT urinalysis dipstick  F/u as needed  Grier Mitts, MD

## 2022-04-09 ENCOUNTER — Ambulatory Visit (INDEPENDENT_AMBULATORY_CARE_PROVIDER_SITE_OTHER): Payer: BLUE CROSS/BLUE SHIELD | Admitting: Adult Health

## 2022-04-09 ENCOUNTER — Encounter: Payer: Self-pay | Admitting: Adult Health

## 2022-04-09 VITALS — BP 110/68 | HR 57 | Temp 98.2°F | Ht 69.5 in | Wt 177.0 lb

## 2022-04-09 DIAGNOSIS — F5101 Primary insomnia: Secondary | ICD-10-CM

## 2022-04-09 DIAGNOSIS — E781 Pure hyperglyceridemia: Secondary | ICD-10-CM

## 2022-04-09 DIAGNOSIS — Z125 Encounter for screening for malignant neoplasm of prostate: Secondary | ICD-10-CM | POA: Diagnosis not present

## 2022-04-09 DIAGNOSIS — E118 Type 2 diabetes mellitus with unspecified complications: Secondary | ICD-10-CM | POA: Diagnosis not present

## 2022-04-09 DIAGNOSIS — E782 Mixed hyperlipidemia: Secondary | ICD-10-CM

## 2022-04-09 DIAGNOSIS — Z0000 Encounter for general adult medical examination without abnormal findings: Secondary | ICD-10-CM | POA: Diagnosis not present

## 2022-04-09 LAB — COMPREHENSIVE METABOLIC PANEL
ALT: 17 U/L (ref 0–53)
AST: 16 U/L (ref 0–37)
Albumin: 4 g/dL (ref 3.5–5.2)
Alkaline Phosphatase: 28 U/L — ABNORMAL LOW (ref 39–117)
BUN: 24 mg/dL — ABNORMAL HIGH (ref 6–23)
CO2: 26 mEq/L (ref 19–32)
Calcium: 9.3 mg/dL (ref 8.4–10.5)
Chloride: 104 mEq/L (ref 96–112)
Creatinine, Ser: 1.25 mg/dL (ref 0.40–1.50)
GFR: 60.06 mL/min (ref >60.00)
Glucose, Bld: 94 mg/dL (ref 70–99)
Potassium: 4.3 mEq/L (ref 3.5–5.1)
Sodium: 140 mEq/L (ref 135–145)
Total Bilirubin: 0.8 mg/dL (ref 0.2–1.2)
Total Protein: 6.5 g/dL (ref 6.0–8.3)

## 2022-04-09 LAB — CBC WITH DIFFERENTIAL/PLATELET
Basophils Absolute: 0 10*3/uL (ref 0.0–0.1)
Basophils Relative: 0.6 % (ref 0.0–3.0)
Eosinophils Absolute: 0.2 10*3/uL (ref 0.0–0.7)
Eosinophils Relative: 2.7 % (ref 0.0–5.0)
HCT: 43 % (ref 39.0–52.0)
Hemoglobin: 14.5 g/dL (ref 13.0–17.0)
Lymphocytes Relative: 18.8 % (ref 12.0–46.0)
Lymphs Abs: 1.2 10*3/uL (ref 0.7–4.0)
MCHC: 33.8 g/dL (ref 30.0–36.0)
MCV: 94.6 fl (ref 78.0–100.0)
Monocytes Absolute: 0.4 10*3/uL (ref 0.1–1.0)
Monocytes Relative: 5.9 % (ref 3.0–12.0)
Neutro Abs: 4.7 10*3/uL (ref 1.4–7.7)
Neutrophils Relative %: 72 % (ref 43.0–77.0)
Platelets: 218 10*3/uL (ref 150.0–400.0)
RBC: 4.55 Mil/uL (ref 4.22–5.81)
RDW: 12.8 % (ref 11.5–15.5)
WBC: 6.6 10*3/uL (ref 4.0–10.5)

## 2022-04-09 LAB — LIPID PANEL
Cholesterol: 158 mg/dL (ref 0–200)
HDL: 58.9 mg/dL (ref >39.00)
LDL Cholesterol: 80 mg/dL (ref 0–99)
NonHDL: 98.88
Total CHOL/HDL Ratio: 3
Triglycerides: 95 mg/dL (ref 0.0–149.0)
VLDL: 19 mg/dL (ref 0.0–40.0)

## 2022-04-09 LAB — TSH: TSH: 2.45 u[IU]/mL (ref 0.35–5.50)

## 2022-04-09 LAB — PSA: PSA: 0.6 ng/mL (ref 0.10–4.00)

## 2022-04-09 MED ORDER — FREESTYLE LIBRE 3 SENSOR MISC: 1.0000 | 6 refills | Status: DC

## 2022-04-09 NOTE — Progress Notes (Signed)
Subjective:    Patient ID: Matthew Patterson, male    DOB: 1955/12/23, 66 y.o.   MRN: [id number]  HPI Patient presents for yearly preventative medicine examination. He is a pleasant 66 year old male who  has a past medical history of Anxiety and depression, Diabetes mellitus without complication ([place]), GERD (gastroesophageal reflux disease), and Hyperlipidemia.  DM type 2 -managed by endocrinology.  He uses the freestyle libre CGM to manage his blood sugars.  Ozempic was held due to nausea and diarrhea and may 2023 metformin was stopped due to hypoglycemic episodes on freestyle libre and fingerstick.  He is now diet controlled. He reports that he continues to have episodes of hypoglycemia episodes after a spike but is not happening as much.  His 90 day average is 135 and he is in goal 85% of the time. He does take lisinopril 2.5 mg daily for kidney protection Lab Results  Component Value Date   HGBA1C 5.8 (A) 01/20/2022   Hyperlipidemia -managed with fenofibrate 145 mg daily. Lipitor 40 mg? Lab Results  Component Value Date   CHOL 169 09/16/2021   HDL 54.30 09/16/2021   LDLCALC 90 09/16/2021   LDLDIRECT 156.0 04/08/2021   TRIG 124.0 09/16/2021   CHOLHDL 3 09/16/2021   Insomnia-takes trazodone 100 mg nightly  Melanoma - reports having two spots of melanoma removed from his back. He is going to see Dermatology every 6 months   All immunizations and health maintenance protocols were reviewed with the patient and needed orders were placed. Refuses vaccinations today   Appropriate screening laboratory values were ordered for the patient including screening of hyperlipidemia, renal function and hepatic function. If indicated by BPH, a PSA was ordered.  Medication reconciliation,  past medical history, social history, problem list and allergies were reviewed in detail with the patient  Goals were established with regard to weight loss, exercise, and  diet in compliance with medications Lab  Results  Component Value Date   CHOL 169 09/16/2021   HDL 54.30 09/16/2021   LDLCALC 90 09/16/2021   LDLDIRECT 156.0 04/08/2021   TRIG 124.0 09/16/2021   CHOLHDL 3 09/16/2021    Review of Systems  Constitutional: Negative.   HENT: Negative.    Eyes: Negative.   Respiratory: Negative.    Cardiovascular: Negative.   Gastrointestinal: Negative.   Endocrine: Negative.   Genitourinary: Negative.   Musculoskeletal: Negative.   Skin: Negative.   Allergic/Immunologic: Negative.   Neurological: Negative.   Hematological: Negative.   Psychiatric/Behavioral: Negative.    All other systems reviewed and are negative.  Past Medical History:  Diagnosis Date   Anxiety and depression    Diabetes mellitus without complication (HCC)    GERD (gastroesophageal reflux disease)    Hyperlipidemia     Social History   Socioeconomic History   Marital status: Married    Spouse name: Not on file   Number of children: Not on file   Years of education: Not on file   Highest education level: Associate degree: academic program  Occupational History   Not on file  Tobacco Use   Smoking status: Never   Smokeless tobacco: Never  Substance and Sexual Activity   Alcohol use: Yes    Alcohol/week: 1.0 - 2.0 standard drink of alcohol    Types: 1 - 2 Cans of beer per week   Drug use: No   Sexual activity: Not on file  Other Topics Concern   Not on file  Social History Narrative  Scientist, 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    One grandchild      He likes to play golf    Social Determinants of Health   Financial Resource Strain: Low Risk  (10/09/2021)   Overall Financial Resource Strain (CARDIA)    Difficulty of Paying Living Expenses: Not hard at all  Food Insecurity: No Food Insecurity (10/09/2021)   Hunger Vital Sign    Worried About Running Out of Food in the Last Year: Never true    Ran Out of Food in the Last Year: Never true  Transportation Needs: No Transportation  Needs (10/09/2021)   PRAPARE - Hydrologist (Medical): No    Lack of Transportation (Non-Medical): No  Physical Activity: Insufficiently Active (10/09/2021)   Exercise Vital Sign    Days of Exercise per Week: 2 days    Minutes of Exercise per Session: 40 min  Stress: No Stress Concern Present (10/09/2021)   [place]    Feeling of Stress : Only a little  Social Connections: Socially Integrated (10/09/2021)   Social Connection and Isolation Panel [NHANES]    Frequency of Communication with Friends and Family: Twice a week    Frequency of Social Gatherings with Friends and Family: Once a week    Attends Religious Services: More than 4 times per year    Active Member of [employer] or Organizations: Yes    Attends Music therapist: More than 4 times per year    Marital Status: Married  Human resources officer Violence: Not on file    Past Surgical History:  Procedure Laterality Date   ROTATOR CUFF REPAIR     SHOULDER SURGERY  2010   B/L    Family History  Problem Relation Age of Onset   Diabetes Mother    Hypertension Mother    Cancer Sister        uterine cancer    No Known Allergies  Current Outpatient Medications on File Prior to Visit  Medication Sig Dispense Refill   Blood Glucose Monitoring Suppl (ONETOUCH VERIO IQ SYSTEM) w/Device KIT 1 kit 1 kit 0   Continuous Blood Gluc Sensor (FREESTYLE LIBRE 2 SENSOR) MISC Use with libre app 6 each 3   fenofibrate (TRICOR) 145 MG tablet Take 1 tablet (145 mg total) by mouth daily. 90 tablet 3   glucose blood test strip Use as instructed 100 each 12   Lancets (ONETOUCH ULTRASOFT) lancets Use as instructed 100 each 12   lisinopril (ZESTRIL) 2.5 MG tablet Take 1 tablet (2.5 mg total) by mouth daily. 90 tablet 2   metFORMIN (GLUCOPHAGE) 1000 MG tablet Take 1,000 mg by mouth 2 (two) times daily.     Semaglutide,0.25 or 0.5MG/DOS, (OZEMPIC, 0.25 OR  0.5 MG/DOSE,) 2 MG/3ML SOPN Inject into the skin.     traZODone (DESYREL) 100 MG tablet Take 1 tablet (100 mg total) by mouth at bedtime. 90 tablet 1   No current facility-administered medications on file prior to visit.    BP 110/68   Pulse (!) 57   Temp 98.2 F (36.8 C) (Oral)   Ht 5' 9.5" (1.765 m)   Wt 177 lb (80.3 kg)   SpO2 97%   BMI 25.76 kg/m       Objective:   Physical Exam Vitals and nursing note reviewed.  Constitutional:      General: He is not in acute distress.  Appearance: Normal appearance. He is well-developed and normal weight.  HENT:     Head: Normocephalic and atraumatic.     Right Ear: Tympanic membrane, ear canal and external ear normal. There is no impacted cerumen.     Left Ear: Tympanic membrane, ear canal and external ear normal. There is no impacted cerumen.     Nose: Nose normal. No congestion or rhinorrhea.     Mouth/Throat:     Mouth: Mucous membranes are moist.     Pharynx: Oropharynx is clear. No oropharyngeal exudate or posterior oropharyngeal erythema.  Eyes:     General:        Right eye: No discharge.        Left eye: No discharge.     Extraocular Movements: Extraocular movements intact.     Conjunctiva/sclera: Conjunctivae normal.     Pupils: Pupils are equal, round, and reactive to light.  Neck:     Vascular: No carotid bruit.     Trachea: No tracheal deviation.  Cardiovascular:     Rate and Rhythm: Normal rate and regular rhythm.     Pulses: Normal pulses.     Heart sounds: Normal heart sounds. No murmur heard.    No friction rub. No gallop.  Pulmonary:     Effort: Pulmonary effort is normal. No respiratory distress.     Breath sounds: Normal breath sounds. No stridor. No wheezing, rhonchi or rales.  Chest:     Chest wall: No tenderness.  Abdominal:     General: Bowel sounds are normal. There is no distension.     Palpations: Abdomen is soft. There is no mass.     Tenderness: There is no abdominal tenderness. There is no  right CVA tenderness, left CVA tenderness, guarding or rebound.     Hernia: No hernia is present.  Musculoskeletal:        General: No swelling, tenderness, deformity or signs of injury. Normal range of motion.     Right lower leg: No edema.     Left lower leg: No edema.  Lymphadenopathy:     Cervical: No cervical adenopathy.  Skin:    General: Skin is warm and dry.     Capillary Refill: Capillary refill takes less than 2 seconds.     Coloration: Skin is not jaundiced or pale.     Findings: No bruising, erythema, lesion or rash.  Neurological:     General: No focal deficit present.     Mental Status: He is alert and oriented to person, place, and time.     Cranial Nerves: No cranial nerve deficit.     Sensory: No sensory deficit.     Motor: No weakness.     Coordination: Coordination normal.     Gait: Gait normal.     Deep Tendon Reflexes: Reflexes normal.  Psychiatric:        Mood and Affect: Mood normal.        Behavior: Behavior normal.        Thought Content: Thought content normal.        Judgment: Judgment normal.       Assessment & Plan:  1. Routine general medical examination at a health care facility - Follow up in one year  - Continue to eat healthy and exercise  - CBC with Differential/Platelet; Future - Comprehensive metabolic panel; Future - Lipid panel; Future - TSH; Future  2. Controlled type 2 diabetes mellitus with complication, without long-term current use of insulin ([place]) - Per  endocrinology  - Will send in [place] 3 for him  - CBC with Differential/Platelet; Future - Comprehensive metabolic panel; Future - Lipid panel; Future - TSH; Future   4. HYPERTRIGLYCERIDEMIA - Continue with Lipitor and Tricor - CBC with Differential/Platelet; Future - Comprehensive metabolic panel; Future - Lipid panel; Future - TSH; Future  5. Prostate cancer screening  - PSA; Future  6. Primary insomnia - Continue with trazodone   Dorothyann Peng, NP

## 2022-04-09 NOTE — Patient Instructions (Addendum)
It was great seeing you today   We will follow up with you regarding your lab work   Please let me know if you need anything   

## 2022-04-18 ENCOUNTER — Other Ambulatory Visit: Payer: Self-pay | Admitting: Adult Health

## 2022-04-18 DIAGNOSIS — G4700 Insomnia, unspecified: Secondary | ICD-10-CM

## 2022-05-19 NOTE — Progress Notes (Unsigned)
Name: Matthew Patterson  MRN/ DOB: [id number], 02-21-1956   Age/ Sex: 66 y.o., male    PCP: Dorothyann Peng, NP   Reason for Endocrinology Evaluation: Type 2 Diabetes Mellitus     Date of Initial Endocrinology Visit: 09/16/2021    PATIENT IDENTIFIER: Matthew Patterson is a 66 y.o. male with a past medical history of T2DM and dyslipidemia . The patient presented for initial endocrinology clinic visit on 09/16/2021 for consultative assistance with his diabetes management.    HPI: Matthew Patterson was    Diagnosed with DM 2018 Prior Medications tried/Intolerance: Has been on Metformin for a few years . Ozempic added 06/2021 Hemoglobin A1c has ranged from 6.6% in 2020, peaking at 11.9% in 2022.  On his initial visit to our clinic his A1c was 5.6%, he was already on metformin and Ozempic and we did not make any changes  Ozempic stopped 10/2021 with an A1c 5.6%    Metformin discontinued 10/2021 with hypoglycemia on freestyle libre and finger stick of 62 mg/dL   SUBJECTIVE:   During the last visit (01/20/2022): A1c 5.8%    Today (05/21/22): Matthew Patterson  is here for follow-up on diabetes management.  He checks his blood sugars multiple  times daily, through [employer]. The patient has  had hypoglycemic episodes since the last clinic visit, which typically occur 1 x /week - most often occuring at night . The patient is not symptomatic with these episodes   Denies nausea vomiting or diarrhea    HOME DIABETES REGIMEN: N/A         Statin: yes ACE-I/ARB: yes     CONTINUOUS GLUCOSE MONITORING RECORD INTERPRETATION    Dates of Recording: 10/6-10/19/2023  Sensor description: freestyle libre   Results statistics:   CGM use % of time 88  Average and SD 122/25.2  Time in range 95%  % Time Above 180 5  % Time above 250 0  % Time Below target 0   Glycemic patterns summary: Bg's optimal during the day and night   Hyperglycemic episodes  with meals   Hypoglycemic  episodes  n/a  Overnight periods: optimal      DIABETIC COMPLICATIONS: Microvascular complications:   Denies: CKD, neuropathy  Last eye exam: Completed 2022  Macrovascular complications:   Denies: CAD, PVD, CVA   PAST HISTORY: Past Medical History:  Past Medical History:  Diagnosis Date   Anxiety and depression    Diabetes mellitus without complication ([place])    GERD (gastroesophageal reflux disease)    Hyperlipidemia    Past Surgical History:  Past Surgical History:  Procedure Laterality Date   ROTATOR CUFF REPAIR     SHOULDER SURGERY  2010   B/L    Social History:  reports that he has never smoked. He has never used smokeless tobacco. He reports current alcohol use of about 1.0 - 2.0 standard drink of alcohol per week. He reports that he does not use drugs. Family History:  Family History  Problem Relation Age of Onset   Diabetes Mother    Hypertension Mother    Cancer Sister        uterine cancer     HOME MEDICATIONS: Allergies as of 05/21/2022   No Known Allergies      Medication List        Accurate as of May 21, 2022  7:59 AM. If you have any questions, ask your nurse or doctor.          atorvastatin 40 MG  tablet Commonly known as: LIPITOR Take 40 mg by mouth daily.   fenofibrate 145 MG tablet Commonly known as: TRICOR Take 1 tablet (145 mg total) by mouth daily.   FreeStyle Libre 3 Sensor Misc 1 Device by Does not apply route every 14 (fourteen) days. Place 1 sensor on the skin every 14 days. Use to check glucose continuously   glucose blood test strip Use as instructed   lisinopril 2.5 MG tablet Commonly known as: ZESTRIL Take 1 tablet (2.5 mg total) by mouth daily.   metFORMIN 1000 MG tablet Commonly known as: GLUCOPHAGE Take 1,000 mg by mouth 2 (two) times daily.   onetouch ultrasoft lancets Use as instructed   OneTouch Verio IQ System w/Device Kit 1 kit   Ozempic (0.25 or 0.5 MG/DOSE) 2 MG/3ML Sopn Generic drug:  Semaglutide(0.25 or 0.5MG/DOS) Inject into the skin.   traZODone 100 MG tablet Commonly known as: DESYREL TAKE 1 TABLET(100 MG) BY MOUTH AT BEDTIME         ALLERGIES: No Known Allergies   REVIEW OF SYSTEMS: A comprehensive ROS was conducted with the patient and is negative except as per HPI   OBJECTIVE:   VITAL SIGNS: BP 112/70 (BP Location: Left Arm, Patient Position: Sitting, Cuff Size: Small)   Pulse 60   Ht 5' 9.5" (1.765 m)   Wt 178 lb (80.7 kg)   SpO2 94%   BMI 25.91 kg/m    PHYSICAL EXAM:  General: Pt appears well and is in NAD  Lungs: Clear with good BS bilat with no rales, rhonchi, or wheezes  Heart: RRR   Abdomen: Normoactive bowel sounds, soft, nontender, without masses or organomegaly palpable  Extremities:  Lower extremities - No pretibial edema.  Neuro: MS is good with appropriate affect, pt is alert and Ox3    DM foot exam: 09/16/2021  The skin of the feet is intact without sores or ulcerations. The pedal pulses are 2+ on right and 2+ on left. The sensation is intact to a screening 5.07, 10 gram monofilament bilaterally      DATA REVIEWED:  Lab Results  Component Value Date   HGBA1C 6.0 (A) 05/21/2022   HGBA1C 5.8 (A) 01/20/2022   HGBA1C 5.6 09/16/2021    Latest Reference Range & Units 09/16/21 07:55  Sodium 135 - 145 mEq/L 139  Potassium 3.5 - 5.1 mEq/L 4.4  Chloride 96 - 112 mEq/L 105  CO2 19 - 32 mEq/L 30  Glucose 70 - 99 mg/dL 98  BUN 6 - 23 mg/dL 21  Creatinine 0.40 - 1.50 mg/dL 1.20  Calcium 8.4 - 10.5 mg/dL 9.5  GFR >60.00 mL/min 63.32    Latest Reference Range & Units 09/16/21 07:55  Total CHOL/HDL Ratio  3  Cholesterol 0 - 200 mg/dL 169  HDL Cholesterol >39.00 mg/dL 54.30  LDL (calc) 0 - 99 mg/dL 90  MICROALB/CREAT RATIO 0.0 - 30.0 mg/g 0.8  NonHDL  114.72  Triglycerides 0.0 - 149.0 mg/dL 124.0  VLDL 0.0 - 40.0 mg/dL 24.8    Latest Reference Range & Units 09/16/21 07:55  Creatinine,U mg/dL 88.4  Microalb, Ur 0.0 -  1.9 mg/dL <0.7  MICROALB/CREAT RATIO 0.0 - 30.0 mg/g 0.8   ASSESSMENT / PLAN / RECOMMENDATIONS:   1) Type 2 Diabetes Mellitus, Optimally controlled, Without complications - Most recent A1c of 6.0 %. Goal A1c < 7.0 %.    -His A1c remains optimal -We held Ozempic due to nausea and diarrhea - I had stopped his metformin in 10/2021 but  he continues to usemetformin occasional for post prandial hyperglycemia, on average once monthly which I have advised him against doing that - His hypoglycemic episoes on freestyle libre 3 are not accurate as his finger stick is higher  - We discussed using CGM data to adjust his eating habits and limit CHO intake    MEDICATIONS: N/A   EDUCATION / INSTRUCTIONS: BG monitoring instructions: Patient is instructed to check his blood sugars 3 times a day, before meals . Call [facility] if: BG persistently < 70 I reviewed the Rule of 15 for the treatment of hypoglycemia in detail with the patient. Literature supplied.   2) Diabetic complications:  Eye: Does not have known diabetic retinopathy.  Neuro/ Feet: Does not have known diabetic peripheral neuropathy. Renal: Patient does not have known baseline CKD. He is  on an ACEI/ARB at present.    F/U in 6 months     Signed electronically by: Mack Guise, MD  [facility] Endocrinology  [facility] [address] [place], [place] [address] Phone: [phone] FAX: [phone]   CC: Dorothyann Peng, NP [place] [address] Phone: [phone]  Fax: [phone]    Return to Endocrinology clinic as below: No future appointments.

## 2022-05-21 ENCOUNTER — Encounter: Payer: Self-pay | Admitting: Internal Medicine

## 2022-05-21 ENCOUNTER — Ambulatory Visit: Payer: BLUE CROSS/BLUE SHIELD | Admitting: Internal Medicine

## 2022-05-21 VITALS — BP 112/70 | HR 60 | Ht 69.5 in | Wt 178.0 lb

## 2022-05-21 DIAGNOSIS — E119 Type 2 diabetes mellitus without complications: Secondary | ICD-10-CM | POA: Diagnosis not present

## 2022-05-21 LAB — POCT GLYCOSYLATED HEMOGLOBIN (HGB A1C): Hemoglobin A1C: 6 % — AB (ref 4.0–5.6)

## 2022-05-21 MED ORDER — FREESTYLE LIBRE 3 SENSOR MISC: 1.0000 | 3 refills | Status: DC

## 2022-06-02 ENCOUNTER — Telehealth: Payer: Self-pay | Admitting: Adult Health

## 2022-06-02 NOTE — Telephone Encounter (Signed)
Pt's spouse called to request a refill of the traZODone (DESYREL) 100 MG tablet  LOV:  04/09/22 = CPE  [id number] - [place], [place] - [address] AT [facility] OF [address] & [address] Phone:  [phone]  Fax:  [phone]

## 2022-06-03 ENCOUNTER — Other Ambulatory Visit: Payer: Self-pay

## 2022-06-03 DIAGNOSIS — G4700 Insomnia, unspecified: Secondary | ICD-10-CM

## 2022-06-03 MED ORDER — TRAZODONE HCL 100 MG PO TABS: ORAL_TABLET | ORAL | 1 refills | Status: DC

## 2022-06-03 NOTE — Telephone Encounter (Signed)
Pt stated that his pharmacy changed and needed this to go to [facility]. Pt stated tha the never picked the Rx up from [place]. I called [facility] to verify and they stated last pick up was 01/19/2022. PT Rx has been sent to [facility]. Pt notified of update.

## 2022-06-13 ENCOUNTER — Other Ambulatory Visit: Payer: Self-pay | Admitting: Adult Health

## 2022-06-13 DIAGNOSIS — E118 Type 2 diabetes mellitus with unspecified complications: Secondary | ICD-10-CM

## 2022-06-13 DIAGNOSIS — E781 Pure hyperglyceridemia: Secondary | ICD-10-CM

## 2022-06-23 DIAGNOSIS — L814 Other melanin hyperpigmentation: Secondary | ICD-10-CM | POA: Diagnosis not present

## 2022-06-23 DIAGNOSIS — D1801 Hemangioma of skin and subcutaneous tissue: Secondary | ICD-10-CM | POA: Diagnosis not present

## 2022-06-23 DIAGNOSIS — Z85820 Personal history of malignant melanoma of skin: Secondary | ICD-10-CM | POA: Diagnosis not present

## 2022-06-23 DIAGNOSIS — L821 Other seborrheic keratosis: Secondary | ICD-10-CM | POA: Diagnosis not present

## 2022-09-07 LAB — HM DIABETES EYE EXAM

## 2022-09-17 ENCOUNTER — Encounter: Payer: Self-pay | Admitting: Internal Medicine

## 2022-10-02 ENCOUNTER — Telehealth: Payer: Self-pay

## 2022-10-02 NOTE — Telephone Encounter (Signed)
LMTCB and also sent mychart message

## 2022-10-02 NOTE — Telephone Encounter (Signed)
Patient CGM has been going off early morning about 4-5 times and ranging between 50-60's.

## 2022-10-05 NOTE — Telephone Encounter (Signed)
Patient advised.

## 2022-11-05 ENCOUNTER — Other Ambulatory Visit: Payer: Self-pay | Admitting: Internal Medicine

## 2022-11-17 ENCOUNTER — Other Ambulatory Visit: Payer: Self-pay | Admitting: Internal Medicine

## 2022-11-26 ENCOUNTER — Encounter: Payer: Self-pay | Admitting: Internal Medicine

## 2022-11-26 ENCOUNTER — Ambulatory Visit: Payer: BLUE CROSS/BLUE SHIELD | Admitting: Internal Medicine

## 2022-11-26 ENCOUNTER — Other Ambulatory Visit: Payer: Self-pay | Admitting: Adult Health

## 2022-11-26 VITALS — BP 116/74 | HR 62 | Ht 69.5 in | Wt 182.0 lb

## 2022-11-26 DIAGNOSIS — E119 Type 2 diabetes mellitus without complications: Secondary | ICD-10-CM | POA: Diagnosis not present

## 2022-11-26 DIAGNOSIS — Z7984 Long term (current) use of oral hypoglycemic drugs: Secondary | ICD-10-CM

## 2022-11-26 DIAGNOSIS — G4700 Insomnia, unspecified: Secondary | ICD-10-CM

## 2022-11-26 LAB — POCT GLYCOSYLATED HEMOGLOBIN (HGB A1C): Hemoglobin A1C: 5.6 % (ref 4.0–5.6)

## 2022-11-26 MED ORDER — METFORMIN HCL ER 750 MG PO TB24: 750.0000 mg | ORAL_TABLET | Freq: Every day | ORAL | 3 refills | Status: DC

## 2022-11-26 MED ORDER — FREESTYLE LIBRE 3 SENSOR MISC: 1.0000 | 3 refills | Status: DC

## 2022-11-26 NOTE — Patient Instructions (Signed)
Switch Metformin to 750 mg XR daily    HOW TO TREAT LOW BLOOD SUGARS (Blood sugar LESS THAN 70 MG/DL) Please follow the RULE OF 15 for the treatment of hypoglycemia treatment (when your (blood sugars are less than 70 mg/dL)   STEP 1: Take 15 grams of carbohydrates when your blood sugar is low, which includes:  3-4 GLUCOSE TABS  OR 3-4 OZ OF JUICE OR REGULAR SODA OR ONE TUBE OF GLUCOSE GEL    STEP 2: RECHECK blood sugar in 15 MINUTES STEP 3: If your blood sugar is still low at the 15 minute recheck --> then, go back to STEP 1 and treat AGAIN with another 15 grams of carbohydrates.

## 2022-11-26 NOTE — Progress Notes (Signed)
Name: Matthew Patterson  MRN/ DOB: [id number], 13-Nov-1955   Age/ Sex: 67 y.o., male    PCP: Shirline Frees, NP   Reason for Endocrinology Evaluation: Type 2 Diabetes Mellitus     Date of Initial Endocrinology Visit: 09/16/2021    PATIENT IDENTIFIER: [name] is a 67 y.o. male with a past medical history of T2DM and dyslipidemia . The patient presented for initial endocrinology clinic visit on 09/16/2021 for consultative assistance with his diabetes management.    HPI: [name] was    Diagnosed with DM 2018 Prior Medications tried/Intolerance: Has been on Metformin for a few years . Ozempic added 06/2021 Hemoglobin A1c has ranged from 6.6% in 2020, peaking at 11.9% in 2022.  On his initial visit to our clinic his A1c was 5.6%, he was already on metformin and Ozempic and we did not make any changes  Ozempic stopped 10/2021 with an A1c 5.6%    Metformin discontinued 10/2021 with hypoglycemia on freestyle libre and finger stick of 62 mg/dL   SUBJECTIVE:   During the last visit (05/21/2022): A1c 6.0%    Today (11/26/22): [name]  is here for follow-up on diabetes management.  He checks his blood sugars multiple  times daily, through [employer]. The patient has  not had hypoglycemic episodes in the past 2 weeks.   Denies nausea vomiting or diarrhea  Has occasional left foot tingling, does have back issues  Continues to use Metformin   HOME DIABETES REGIMEN: Metformin 1000 mg daily          Statin: yes ACE-I/ARB: yes     CONTINUOUS GLUCOSE MONITORING RECORD INTERPRETATION    Dates of Recording: 4/12-4/25/2024  Sensor description: freestyle libre   Results statistics:   CGM use % of time 92  Average and SD 138/28.4  Time in range 85%  % Time Above 180 14  % Time above 250 1  % Time Below target 0   Glycemic patterns summary: Bg's optimal during the night and fluctuate during the day   Hyperglycemic episodes  with meals   Hypoglycemic  episodes  n/a  Overnight periods: optimal      DIABETIC COMPLICATIONS: Microvascular complications:   Denies: CKD, neuropathy  Last eye exam: Completed 09/07/2022  Macrovascular complications:   Denies: CAD, PVD, CVA   PAST HISTORY: Past Medical History:  Past Medical History:  Diagnosis Date   Anxiety and depression    Diabetes mellitus without complication    GERD (gastroesophageal reflux disease)    Hyperlipidemia    Past Surgical History:  Past Surgical History:  Procedure Laterality Date   ROTATOR CUFF REPAIR     SHOULDER SURGERY  2010   B/L    Social History:  reports that he has never smoked. He has never used smokeless tobacco. He reports current alcohol use of about 1.0 - 2.0 standard drink of alcohol per week. He reports that he does not use drugs. Family History:  Family History  Problem Relation Age of Onset   Diabetes Mother    Hypertension Mother    Cancer Sister        uterine cancer     HOME MEDICATIONS: Allergies as of 11/26/2022   No Known Allergies      Medication List        Accurate as of November 26, 2022  7:52 AM. If you have any questions, ask your nurse or doctor.          STOP taking these  medications    metFORMIN 1000 MG tablet Commonly known as: GLUCOPHAGE Replaced by: metFORMIN 750 MG 24 hr tablet Stopped by: Scarlette Shorts, MD       TAKE these medications    atorvastatin 40 MG tablet Commonly known as: LIPITOR TAKE ONE TABLET BY MOUTH DAILY   fenofibrate 145 MG tablet Commonly known as: TRICOR TAKE ONE TABLET BY MOUTH DAILY   FreeStyle Libre 3 Sensor Misc 1 Device by Does not apply route every 14 (fourteen) days. Place 1 sensor on the skin every 14 days. Use to check glucose continuously   glucose blood test strip Use as instructed   lisinopril 2.5 MG tablet Commonly known as: ZESTRIL TAKE ONE TABLET BY MOUTH DAILY   metFORMIN 750 MG 24 hr tablet Commonly known as: GLUCOPHAGE-XR Take 1 tablet  (750 mg total) by mouth daily with breakfast. Replaces: metFORMIN 1000 MG tablet Started by: Scarlette Shorts, MD   onetouch ultrasoft lancets Use as instructed   OneTouch Verio IQ System w/Device Kit 1 kit   traZODone 100 MG tablet Commonly known as: DESYREL TAKE 1 TABLET BY MOUTH AT BEDTIME What changed: additional instructions Changed by: Shirline Frees, NP         ALLERGIES: No Known Allergies   REVIEW OF SYSTEMS: A comprehensive ROS was conducted with the patient and is negative except as per HPI   OBJECTIVE:   VITAL SIGNS: BP 116/74 (BP Location: Left Arm, Patient Position: Sitting, Cuff Size: Small)   Pulse 62   Ht 5' 9.5" (1.765 m)   Wt 182 lb (82.6 kg)   SpO2 96%   BMI 26.49 kg/m    PHYSICAL EXAM:  General: Pt appears well and is in NAD  Lungs: Clear with good BS bilat  Heart: RRR   Abdomen: soft, nontender  Extremities:  Lower extremities - No pretibial edema.  Neuro: MS is good with appropriate affect, pt is alert and Ox3    DM foot exam: 11/26/2022  The skin of the feet is intact without sores or ulcerations. The pedal pulses are 2+ on right and 2+ on left. The sensation is intact to a screening 5.07, 10 gram monofilament bilaterally      DATA REVIEWED:  Lab Results  Component Value Date   HGBA1C 5.6 11/26/2022   HGBA1C 6.0 (A) 05/21/2022   HGBA1C 5.8 (A) 01/20/2022    Latest Reference Range & Units 04/09/22 07:29  Sodium 135 - 145 mEq/L 140  Potassium 3.5 - 5.1 mEq/L 4.3  Chloride 96 - 112 mEq/L 104  CO2 19 - 32 mEq/L 26  Glucose 70 - 99 mg/dL 94  BUN 6 - 23 mg/dL 24 (H)  Creatinine 2.13 - 1.50 mg/dL 0.86  Calcium 8.4 - 57.8 mg/dL 9.3  Alkaline Phosphatase 39 - 117 U/L 28 (L)  Albumin 3.5 - 5.2 g/dL 4.0  AST 0 - 37 U/L 16  ALT 0 - 53 U/L 17  Total Protein 6.0 - 8.3 g/dL 6.5  Total Bilirubin 0.2 - 1.2 mg/dL 0.8  GFR >46.96 mL/min 60.06  Total CHOL/HDL Ratio  3  Cholesterol 0 - 200 mg/dL 295  HDL Cholesterol >28.41 mg/dL  32.44  LDL (calc) 0 - 99 mg/dL 80  NonHDL  01.02  Triglycerides 0.0 - 149.0 mg/dL 72.5  VLDL 0.0 - 36.6 mg/dL 44.0    ASSESSMENT / PLAN / RECOMMENDATIONS:   1) Type 2 Diabetes Mellitus, Optimally controlled, Without complications - Most recent A1c of 5.6 %. Goal A1c < 7.0 %.    -  His A1c remains optimal -We held Ozempic due to nausea and diarrhea - He was given the option to stop Metformin but he would like to continue  - We discussed switching regular release Metformin to XR, that was he would have more even coverage through the day, as he has been taking it once daily    MEDICATIONS: Stop Metformin 1000 mg daily  Start Metformin 750 mg XR daily   EDUCATION / INSTRUCTIONS: BG monitoring instructions: Patient is instructed to check his blood sugars 3 times a day, before meals . Call [facility] if: BG persistently < 70 I reviewed the Rule of 15 for the treatment of hypoglycemia in detail with the patient. Literature supplied.   2) Diabetic complications:  Eye: Does not have known diabetic retinopathy.  Neuro/ Feet: Does not have known diabetic peripheral neuropathy. Renal: Patient does not have known baseline CKD. He is  on an ACEI/ARB at present.    F/U in 6 months     Signed electronically by: Lyndle Herrlich, MD  [facility]  [facility] [address] [place] [place], [place] [address] Phone: [phone] FAX: [phone]   CC: Shirline Frees, NP [address] [place] [address] Phone: [phone]  Fax: [phone]    Return to Endocrinology clinic as below: Future Appointments  Date Time Provider Department Center  06/03/2023  7:30 AM Stephenie Navejas, Konrad Dolores, MD [id number] None

## 2022-12-11 ENCOUNTER — Other Ambulatory Visit: Payer: Self-pay | Admitting: Internal Medicine

## 2022-12-13 ENCOUNTER — Other Ambulatory Visit: Payer: Self-pay | Admitting: Adult Health

## 2022-12-23 DIAGNOSIS — D692 Other nonthrombocytopenic purpura: Secondary | ICD-10-CM | POA: Diagnosis not present

## 2022-12-23 DIAGNOSIS — Z85820 Personal history of malignant melanoma of skin: Secondary | ICD-10-CM | POA: Diagnosis not present

## 2022-12-23 DIAGNOSIS — L814 Other melanin hyperpigmentation: Secondary | ICD-10-CM | POA: Diagnosis not present

## 2022-12-23 DIAGNOSIS — C44629 Squamous cell carcinoma of skin of left upper limb, including shoulder: Secondary | ICD-10-CM | POA: Diagnosis not present

## 2022-12-23 DIAGNOSIS — L821 Other seborrheic keratosis: Secondary | ICD-10-CM | POA: Diagnosis not present

## 2022-12-23 DIAGNOSIS — D485 Neoplasm of uncertain behavior of skin: Secondary | ICD-10-CM | POA: Diagnosis not present

## 2023-01-21 DIAGNOSIS — C44629 Squamous cell carcinoma of skin of left upper limb, including shoulder: Secondary | ICD-10-CM | POA: Diagnosis not present

## 2023-03-17 ENCOUNTER — Other Ambulatory Visit: Payer: Self-pay | Admitting: Adult Health

## 2023-03-17 DIAGNOSIS — E118 Type 2 diabetes mellitus with unspecified complications: Secondary | ICD-10-CM

## 2023-05-05 ENCOUNTER — Ambulatory Visit (INDEPENDENT_AMBULATORY_CARE_PROVIDER_SITE_OTHER): Payer: BLUE CROSS/BLUE SHIELD | Admitting: Adult Health

## 2023-05-05 ENCOUNTER — Encounter: Payer: Self-pay | Admitting: Adult Health

## 2023-05-05 VITALS — BP 120/80 | HR 63 | Temp 98.1°F | Ht 69.0 in | Wt 176.0 lb

## 2023-05-05 DIAGNOSIS — Z7984 Long term (current) use of oral hypoglycemic drugs: Secondary | ICD-10-CM | POA: Diagnosis not present

## 2023-05-05 DIAGNOSIS — Z23 Encounter for immunization: Secondary | ICD-10-CM | POA: Diagnosis not present

## 2023-05-05 DIAGNOSIS — Z125 Encounter for screening for malignant neoplasm of prostate: Secondary | ICD-10-CM | POA: Diagnosis not present

## 2023-05-05 DIAGNOSIS — Z0000 Encounter for general adult medical examination without abnormal findings: Secondary | ICD-10-CM

## 2023-05-05 DIAGNOSIS — E782 Mixed hyperlipidemia: Secondary | ICD-10-CM | POA: Diagnosis not present

## 2023-05-05 DIAGNOSIS — F5101 Primary insomnia: Secondary | ICD-10-CM | POA: Diagnosis not present

## 2023-05-05 DIAGNOSIS — Z85820 Personal history of malignant melanoma of skin: Secondary | ICD-10-CM

## 2023-05-05 DIAGNOSIS — E118 Type 2 diabetes mellitus with unspecified complications: Secondary | ICD-10-CM | POA: Diagnosis not present

## 2023-05-05 LAB — LIPID PANEL
Cholesterol: 198 mg/dL (ref 0–200)
HDL: 62.9 mg/dL (ref >39.00)
LDL Cholesterol: 117 mg/dL — ABNORMAL HIGH (ref 0–99)
NonHDL: 134.63
Total CHOL/HDL Ratio: 3
Triglycerides: 86 mg/dL (ref 0.0–149.0)
VLDL: 17.2 mg/dL (ref 0.0–40.0)

## 2023-05-05 LAB — MICROALBUMIN / CREATININE URINE RATIO
Creatinine,U: 100.2 mg/dL
Microalb Creat Ratio: 0.7 mg/g (ref 0.0–30.0)
Microalb, Ur: <0.7 mg/dL (ref 0.0–1.9)

## 2023-05-05 LAB — COMPREHENSIVE METABOLIC PANEL
ALT: 23 U/L (ref 0–53)
AST: 23 U/L (ref 0–37)
Albumin: 4.3 g/dL (ref 3.5–5.2)
Alkaline Phosphatase: 30 U/L — ABNORMAL LOW (ref 39–117)
BUN: 21 mg/dL (ref 6–23)
CO2: 28 meq/L (ref 19–32)
Calcium: 9.2 mg/dL (ref 8.4–10.5)
Chloride: 106 meq/L (ref 96–112)
Creatinine, Ser: 1.37 mg/dL (ref 0.40–1.50)
GFR: 53.4 mL/min — ABNORMAL LOW (ref >60.00)
Glucose, Bld: 109 mg/dL — ABNORMAL HIGH (ref 70–99)
Potassium: 4.6 meq/L (ref 3.5–5.1)
Sodium: 142 meq/L (ref 135–145)
Total Bilirubin: 0.7 mg/dL (ref 0.2–1.2)
Total Protein: 6.6 g/dL (ref 6.0–8.3)

## 2023-05-05 LAB — CBC
HCT: 43 % (ref 39.0–52.0)
Hemoglobin: 14.7 g/dL (ref 13.0–17.0)
MCHC: 34.2 g/dL (ref 30.0–36.0)
MCV: 96 fL (ref 78.0–100.0)
Platelets: 214 10*3/uL (ref 150.0–400.0)
RBC: 4.48 Mil/uL (ref 4.22–5.81)
RDW: 13 % (ref 11.5–15.5)
WBC: 5.1 10*3/uL (ref 4.0–10.5)

## 2023-05-05 LAB — PSA: PSA: 0.69 ng/mL (ref 0.10–4.00)

## 2023-05-05 LAB — TSH: TSH: 2.17 u[IU]/mL (ref 0.35–5.50)

## 2023-05-05 NOTE — Patient Instructions (Signed)
It was great seeing you today   We will follow up with you regarding your lab work   Please let me know if you need anything   

## 2023-05-05 NOTE — Progress Notes (Signed)
Subjective:    Patient ID: Matthew Patterson, male    DOB: 1956-02-23, 67 y.o.   MRN: [id number]  HPI Patient presents for yearly preventative medicine examination. He is a 67 year old male who  has a past medical history of Anxiety and depression, Diabetes mellitus without complication (HCC), GERD (gastroesophageal reflux disease), and Hyperlipidemia.  DM type 2 -managed by endocrinology.  He uses the freestyle libre CGM to manage his blood sugars. His current regimen includes that of Metformin 750 mg XR. He is on lisinopril 2.5 mg for kidney protection.  Lab Results  Component Value Date   HGBA1C 5.6 11/26/2022    Hyperlipidemia -managed with fenofibrate 145 mg daily. Lipitor 40 mg Lab Results  Component Value Date   CHOL 158 04/09/2022   HDL 58.90 04/09/2022   LDLCALC 80 04/09/2022   LDLDIRECT 156.0 04/08/2021   TRIG 95.0 04/09/2022   CHOLHDL 3 04/09/2022   Insomnia-takes trazodone 100 mg nightly  Melanoma - reports having two areas of melanoma removed from his back. He is going to see Dermatology every 6 months   All immunizations and health maintenance protocols were reviewed with the patient and needed orders were placed.Refuses shingles, flu, and pneumonia vaccinations. He will due his Tdap today.   Appropriate screening laboratory values were ordered for the patient including screening of hyperlipidemia, renal function and hepatic function. If indicated by BPH, a PSA was ordered.  Medication reconciliation,  past medical history, social history, problem list and allergies were reviewed in detail with the patient  Goals were established with regard to weight loss, exercise, and  diet in compliance with medications He is eating healthy and exercising.  Wt Readings from Last 3 Encounters:  05/05/23 176 lb (79.8 kg)  11/26/22 182 lb (82.6 kg)  05/21/22 178 lb (80.7 kg)    He is up to date on routine colon cancer screening    Review of Systems  Constitutional:  Negative.   HENT: Negative.    Eyes: Negative.   Respiratory: Negative.    Cardiovascular: Negative.   Gastrointestinal: Negative.   Endocrine: Negative.   Genitourinary: Negative.   Musculoskeletal: Negative.   Skin: Negative.   Allergic/Immunologic: Negative.   Neurological: Negative.   Hematological: Negative.   Psychiatric/Behavioral: Negative.    All other systems reviewed and are negative.  Past Medical History:  Diagnosis Date   Anxiety and depression    Diabetes mellitus without complication (HCC)    GERD (gastroesophageal reflux disease)    Hyperlipidemia     Social History   Socioeconomic History   Marital status: Married    Spouse name: Not on file   Number of children: Not on file   Years of education: Not on file   Highest education level: Associate degree: academic program  Occupational History   Not on file  Tobacco Use   Smoking status: Never   Smokeless tobacco: Never  Substance and Sexual Activity   Alcohol use: Yes    Alcohol/week: 1.0 - 2.0 standard drink of alcohol    Types: 1 - 2 Cans of beer per week   Drug use: No   Sexual activity: Not on file  Other Topics Concern   Not on file  Social History Narrative   Senior buyer for manufacturing    Married   One children    One grandchild      He likes to play golf    Social Determinants of Health   Financial Resource Strain: Low  Risk  (10/09/2021)   Overall Financial Resource Strain (CARDIA)    Difficulty of Paying Living Expenses: Not hard at all  Food Insecurity: No Food Insecurity (10/09/2021)   Hunger Vital Sign    Worried About Running Out of Food in the Last Year: Never true    Ran Out of Food in the Last Year: Never true  Transportation Needs: No Transportation Needs (10/09/2021)   PRAPARE - Administrator, Civil Service (Medical): No    Lack of Transportation (Non-Medical): No  Physical Activity: Insufficiently Active (10/09/2021)   Exercise Vital Sign    Days of Exercise  per Week: 2 days    Minutes of Exercise per Session: 40 min  Stress: No Stress Concern Present (10/09/2021)   [employer] of Occupational Health - Occupational Stress Questionnaire    Feeling of Stress : Only a little  Social Connections: Socially Integrated (10/09/2021)   Social Connection and Isolation Panel [NHANES]    Frequency of Communication with Friends and Family: Twice a week    Frequency of Social Gatherings with Friends and Family: Once a week    Attends Religious Services: More than 4 times per year    Active Member of [employer] or Organizations: Yes    Attends Engineer, structural: More than 4 times per year    Marital Status: Married  Catering manager Violence: Not on file    Past Surgical History:  Procedure Laterality Date   ROTATOR CUFF REPAIR     SHOULDER SURGERY  2010   B/L    Family History  Problem Relation Age of Onset   Diabetes Mother    Hypertension Mother    Cancer Sister        uterine cancer    No Known Allergies  Current Outpatient Medications on File Prior to Visit  Medication Sig Dispense Refill   atorvastatin (LIPITOR) 40 MG tablet TAKE 1 TABLET BY MOUTH DAILY 90 tablet 1   Blood Glucose Monitoring Suppl (ONETOUCH VERIO IQ SYSTEM) w/Device KIT 1 kit 1 kit 0   Continuous Glucose Sensor (FREESTYLE LIBRE 3 SENSOR) MISC 1 Device by Does not apply route every 14 (fourteen) days. Place 1 sensor on the skin every 14 days. Use to check glucose continuously 6 each 3   fenofibrate (TRICOR) 145 MG tablet TAKE ONE TABLET BY MOUTH DAILY 90 tablet 3   glucose blood test strip Use as instructed 100 each 12   Lancets (ONETOUCH ULTRASOFT) lancets Use as instructed 100 each 12   lisinopril (ZESTRIL) 2.5 MG tablet TAKE 1 TABLET BY MOUTH DAILY 90 tablet 0   metFORMIN (GLUCOPHAGE-XR) 750 MG 24 hr tablet Take 1 tablet (750 mg total) by mouth daily with breakfast. 90 tablet 3   traZODone (DESYREL) 100 MG tablet TAKE 1 TABLET BY MOUTH AT BEDTIME 90 tablet 1    No current facility-administered medications on file prior to visit.    BP 120/80   Pulse 63   Temp 98.1 F (36.7 C) (Oral)   Ht 5\' 9"  (1.753 m)   Wt 176 lb (79.8 kg)   SpO2 97%   BMI 25.99 kg/m       Objective:   Physical Exam Vitals and nursing note reviewed.  Constitutional:      General: He is not in acute distress.    Appearance: Normal appearance. He is not ill-appearing.  HENT:     Head: Normocephalic and atraumatic.     Right Ear: Tympanic membrane, ear canal and  external ear normal. There is no impacted cerumen.     Left Ear: Tympanic membrane, ear canal and external ear normal. There is no impacted cerumen.     Nose: Nose normal. No congestion or rhinorrhea.     Mouth/Throat:     Mouth: Mucous membranes are moist.     Pharynx: Oropharynx is clear.  Eyes:     Extraocular Movements: Extraocular movements intact.     Conjunctiva/sclera: Conjunctivae normal.     Pupils: Pupils are equal, round, and reactive to light.  Neck:     Vascular: No carotid bruit.  Cardiovascular:     Rate and Rhythm: Normal rate and regular rhythm.     Pulses: Normal pulses.     Heart sounds: No murmur heard.    No friction rub. No gallop.  Pulmonary:     Effort: Pulmonary effort is normal.     Breath sounds: Normal breath sounds.  Abdominal:     General: Abdomen is flat. Bowel sounds are normal. There is no distension.     Palpations: Abdomen is soft. There is no mass.     Tenderness: There is no abdominal tenderness. There is no guarding or rebound.     Hernia: No hernia is present.  Musculoskeletal:        General: Normal range of motion.     Cervical back: Normal range of motion and neck supple.  Lymphadenopathy:     Cervical: No cervical adenopathy.  Skin:    General: Skin is warm and dry.     Capillary Refill: Capillary refill takes less than 2 seconds.  Neurological:     General: No focal deficit present.     Mental Status: He is alert and oriented to person, place,  and time.  Psychiatric:        Mood and Affect: Mood normal.        Behavior: Behavior normal.        Thought Content: Thought content normal.        Judgment: Judgment normal.        Assessment & Plan:  1. Routine general medical examination at a health care facility Today patient counseled on age appropriate routine health concerns for screening and prevention, each reviewed and up to date or declined. Immunizations reviewed and up to date or declined. Labs ordered and reviewed. Risk factors for depression reviewed and negative. Hearing function and visual acuity are intact. ADLs screened and addressed as needed. Functional ability and level of safety reviewed and appropriate. Education, counseling and referrals performed based on assessed risks today. Patient provided with a copy of personalized plan for preventive services. - Continue to eat healthy and exercise - Follow up in one year or sooner if needed   2. Controlled type 2 diabetes mellitus with complication, without long-term current use of insulin (HCC) - Per endocrinology  - Lipid panel; Future - TSH; Future - CBC; Future - Comprehensive metabolic panel; Future - Microalbumin/Creatinine Ratio, Urine; Future  3. Mixed hyperlipidemia - Continue with lipitor and fenofibrate  - Lipid panel; Future - TSH; Future - CBC; Future - Comprehensive metabolic panel; Future  4. Prostate cancer screening  - PSA; Future  5. Primary insomnia - Continue with Trazodone  - Lipid panel; Future - TSH; Future - CBC; Future - Comprehensive metabolic panel; Future  6. History of melanoma - Per dermatology   7. Need for tetanus booster  - Tdap vaccine greater than or equal to 7yo IM  Shirline Frees, NP

## 2023-06-03 ENCOUNTER — Ambulatory Visit: Payer: BC Managed Care – PPO | Admitting: Internal Medicine

## 2023-06-03 ENCOUNTER — Encounter: Payer: Self-pay | Admitting: Internal Medicine

## 2023-06-03 VITALS — BP 120/80 | HR 61 | Ht 69.0 in | Wt 178.0 lb

## 2023-06-03 DIAGNOSIS — E785 Hyperlipidemia, unspecified: Secondary | ICD-10-CM

## 2023-06-03 DIAGNOSIS — Z7984 Long term (current) use of oral hypoglycemic drugs: Secondary | ICD-10-CM

## 2023-06-03 DIAGNOSIS — E119 Type 2 diabetes mellitus without complications: Secondary | ICD-10-CM

## 2023-06-03 LAB — POCT GLYCOSYLATED HEMOGLOBIN (HGB A1C): Hemoglobin A1C: 5.9 % — AB (ref 4.0–5.6)

## 2023-06-03 MED ORDER — FREESTYLE LIBRE 3 PLUS SENSOR MISC: 1.0000 | 3 refills | Status: DC

## 2023-06-03 MED ORDER — METFORMIN HCL ER 750 MG PO TB24: 750.0000 mg | ORAL_TABLET | Freq: Every day | ORAL | 3 refills | Status: DC

## 2023-06-03 NOTE — Progress Notes (Signed)
Name: Matthew Patterson  MRN/ DOB: [id number], 1956/03/08   Age/ Sex: 67 y.o., male    PCP: Shirline Frees, NP   Reason for Endocrinology Evaluation: Type 2 Diabetes Mellitus     Date of Initial Endocrinology Visit: 09/16/2021    PATIENT IDENTIFIER: Matthew Patterson is a 67 y.o. male with a past medical history of T2DM and dyslipidemia . The patient presented for initial endocrinology clinic visit on 09/16/2021 for consultative assistance with his diabetes management.    HPI: Matthew Patterson was    Diagnosed with DM 2018 Prior Medications tried/Intolerance: Has been on Metformin for a few years . Ozempic added 06/2021 Hemoglobin A1c has ranged from 6.6% in 2020, peaking at 11.9% in 2022.  On his initial visit to our clinic his A1c was 5.6%, he was already on metformin and Ozempic and we did not make any changes  Ozempic stopped 10/2021  due to nausea and diarrhea    SUBJECTIVE:   During the last visit (11/26/2022): A1c 5.6%    Today (06/03/23): Matthew Patterson  is here for follow-up on diabetes management.  He checks his blood sugars multiple  times daily, through [employer]. No hypoglycemic episodes    Denies nausea or vomiting  Denies constipation or diarrhea    HOME DIABETES REGIMEN: Metformin 750 mg XR daily       Statin: yes ACE-I/ARB: yes     CONTINUOUS GLUCOSE MONITORING RECORD INTERPRETATION    Dates of Recording: 10/18-10/31/2024  Sensor description: freestyle libre   Results statistics:   CGM use % of time 38  Average and SD 134/24.8  Time in range 88%  % Time Above 180 12  % Time above 250 0  % Time Below target 0   Glycemic patterns summary: Bg's optimal at night and most of the day   Hyperglycemic episodes  with meals   Hypoglycemic episodes  n/a  Overnight periods: optimal      DIABETIC COMPLICATIONS: Microvascular complications:   Denies: CKD, neuropathy  Last eye exam: Completed 09/07/2022  Macrovascular complications:    Denies: CAD, PVD, CVA   PAST HISTORY: Past Medical History:  Past Medical History:  Diagnosis Date   Anxiety and depression    Diabetes mellitus without complication (HCC)    GERD (gastroesophageal reflux disease)    Hyperlipidemia    Past Surgical History:  Past Surgical History:  Procedure Laterality Date   ROTATOR CUFF REPAIR     SHOULDER SURGERY  2010   B/L    Social History:  reports that he has never smoked. He has never used smokeless tobacco. He reports current alcohol use of about 1.0 - 2.0 standard drink of alcohol per week. He reports that he does not use drugs. Family History:  Family History  Problem Relation Age of Onset   Diabetes Mother    Hypertension Mother    Cancer Sister        uterine cancer     HOME MEDICATIONS: Allergies as of 06/03/2023   No Known Allergies      Medication List        Accurate as of June 03, 2023  7:40 AM. If you have any questions, ask your nurse or doctor.          atorvastatin 40 MG tablet Commonly known as: LIPITOR TAKE 1 TABLET BY MOUTH DAILY   fenofibrate 145 MG tablet Commonly known as: TRICOR TAKE ONE TABLET BY MOUTH DAILY   FreeStyle Libre 3 Sensor Misc 1  Device by Does not apply route every 14 (fourteen) days. Place 1 sensor on the skin every 14 days. Use to check glucose continuously   glucose blood test strip Use as instructed   lisinopril 2.5 MG tablet Commonly known as: ZESTRIL TAKE 1 TABLET BY MOUTH DAILY   metFORMIN 750 MG 24 hr tablet Commonly known as: GLUCOPHAGE-XR Take 1 tablet (750 mg total) by mouth daily with breakfast.   onetouch ultrasoft lancets Use as instructed   OneTouch Verio IQ System w/Device Kit 1 kit   traZODone 100 MG tablet Commonly known as: DESYREL TAKE 1 TABLET BY MOUTH AT BEDTIME         ALLERGIES: No Known Allergies   REVIEW OF SYSTEMS: A comprehensive ROS was conducted with the patient and is negative except as per HPI   OBJECTIVE:    VITAL SIGNS: BP 120/80 (BP Location: Left Arm, Patient Position: Sitting, Cuff Size: Large)   Pulse 61   Ht 5\' 9"  (1.753 m)   Wt 178 lb (80.7 kg)   SpO2 99%   BMI 26.29 kg/m    PHYSICAL EXAM:  General: Pt appears well and is in NAD  Lungs: Clear with good BS bilat  Heart: RRR   Abdomen: soft, nontender  Extremities:  Lower extremities - No pretibial edema.  Neuro: MS is good with appropriate affect, pt is alert and Ox3    DM foot exam: 06/03/2023  The skin of the feet is intact without sores or ulcerations. The pedal pulses are 2+ on right and 2+ on left. The sensation is intact to a screening 5.07, 10 gram monofilament bilaterally      DATA REVIEWED:  Lab Results  Component Value Date   HGBA1C 5.9 (A) 06/03/2023   HGBA1C 5.6 11/26/2022   HGBA1C 6.0 (A) 05/21/2022    Latest Reference Range & Units 05/05/23 08:00  Sodium 135 - 145 mEq/L 142  Potassium 3.5 - 5.1 mEq/L 4.6  Chloride 96 - 112 mEq/L 106  CO2 19 - 32 mEq/L 28  Glucose 70 - 99 mg/dL 630 (H)  BUN 6 - 23 mg/dL 21  Creatinine 1.60 - 1.09 mg/dL 3.23  Calcium 8.4 - 55.7 mg/dL 9.2  Alkaline Phosphatase 39 - 117 U/L 30 (L)  Albumin 3.5 - 5.2 g/dL 4.3  AST 0 - 37 U/L 23  ALT 0 - 53 U/L 23  Total Protein 6.0 - 8.3 g/dL 6.6  Total Bilirubin 0.2 - 1.2 mg/dL 0.7  GFR >32.20 mL/min 53.40 (L)  Total CHOL/HDL Ratio  3  Cholesterol 0 - 200 mg/dL 254  HDL Cholesterol >27.06 mg/dL 23.76  LDL (calc) 0 - 99 mg/dL 283 (H)  MICROALB/CREAT RATIO 0.0 - 30.0 mg/g 0.7  NonHDL  134.63  Triglycerides 0.0 - 149.0 mg/dL 15.1  VLDL 0.0 - 76.1 mg/dL 60.7     Latest Reference Range & Units 05/05/23 08:00  Creatinine,U mg/dL 371.0  Microalb, Ur 0.0 - 1.9 mg/dL <6.2  MICROALB/CREAT RATIO 0.0 - 30.0 mg/g 0.7    ASSESSMENT / PLAN / RECOMMENDATIONS:   1) Type 2 Diabetes Mellitus, Optimally controlled, Without complications - Most recent A1c of 5.6 %. Goal A1c < 7.0 %.    -His A1c remains optimal -We held Ozempic due  to nausea and diarrhea -I refilled for freestyle libre 3+ #6 and 3 refills was sent to the pharmacy   MEDICATIONS: Continue  Metformin 750 mg XR daily   EDUCATION / INSTRUCTIONS: BG monitoring instructions: Patient is instructed to check his  blood sugars 3 times a day, before meals . Call [facility] if: BG persistently < 70 I reviewed the Rule of 15 for the treatment of hypoglycemia in detail with the patient. Literature supplied.   2) Diabetic complications:  Eye: Does not have known diabetic retinopathy.  Neuro/ Feet: Does not have known diabetic peripheral neuropathy. Renal: Patient does not have known baseline CKD. He is  on an ACEI/ARB at present.   3) Dyslipidemia:  -LDL has increased from 80 Mg/DL to 409 mg/DL over the past year -Patient assures me compliance with atorvastatin, but he has been taking it in the morning, patient advised to take this in the evening preferably bedtime -Encourage low-fat diet as well as regular exercise -Will continue to monitor, but if this remains elevated we will change to rosuvastatin  Medication Continue atorvastatin 40 mg daily  F/U in 6 months     Signed electronically by: Lyndle Herrlich, MD  [facility]  [facility] [address] [place] [place], [place] [address] Phone: [phone] FAX: [phone]   CC: Shirline Frees, NP [address] [place] [address] Phone: [phone]  Fax: [phone]    Return to Endocrinology clinic as below: Future Appointments  Date Time Provider Department Center  05/05/2024  7:00 AM Evelene Croon, Kandee Keen, NP [facility]

## 2023-06-23 ENCOUNTER — Other Ambulatory Visit: Payer: Self-pay | Admitting: Adult Health

## 2023-06-23 DIAGNOSIS — G4700 Insomnia, unspecified: Secondary | ICD-10-CM

## 2023-06-23 DIAGNOSIS — E781 Pure hyperglyceridemia: Secondary | ICD-10-CM

## 2023-06-23 DIAGNOSIS — E118 Type 2 diabetes mellitus with unspecified complications: Secondary | ICD-10-CM

## 2023-06-26 ENCOUNTER — Other Ambulatory Visit: Payer: Self-pay | Admitting: Adult Health

## 2023-08-09 ENCOUNTER — Ambulatory Visit: Payer: Self-pay | Admitting: Adult Health

## 2023-08-09 NOTE — Telephone Encounter (Signed)
 Pt scheduled for tomorrow

## 2023-08-09 NOTE — Telephone Encounter (Signed)
  Chief Complaint: Abd pain Symptoms: Abd pain, tenderness, diarrhea Frequency: Ongoing since Saturday Pertinent Negatives: Patient denies fever, N/V Disposition: [] ED /[] Urgent Care (no appt availability in office) / [x] Appointment(In office/virtual)/ []  [facility]/ [] Home Care/ [] Refused Recommended Disposition /[] [facility]/ []  Follow-up with PCP Additional Notes: Pt reports he has a history of diverticulitis and has been experiencing symptoms similar to those he's previously had since Saturday. Pt reports abd pain of 7/10 at worst, diarrhea and thin soft stools, abd tenderness. Pt reports his abd pain has been improving today but he believes he may be having a flare up of his diverticulitis. No available appts today, with pt reporting improvement in symptoms OV scheduled for 01/07 AM. This RN educated pt on new-worsening symptoms, when to call back/seek emergent care. Pt verbalized understanding and agrees to plan.   Copied from CRM [id number]. Topic: Clinical - Red Word Triage >> Aug 09, 2023  4:35 PM Tiffany H wrote: Red Word that prompted transfer to Nurse Triage: PAIN, TENDER, DIARRHEA. Patient's wife called with patient to advise that patient was diagnosed Diverticulitis this year. Patient is having a flare up. Please assist. Reason for Disposition  [1] MILD-MODERATE pain AND [2] constant AND [3] present > 2 hours  Answer Assessment - Initial Assessment Questions 1. LOCATION: Where does it hurt?      Low abd pain 2. RADIATION: Does the pain shoot anywhere else? (e.g., chest, back)     No 3. ONSET: When did the pain begin? (Minutes, hours or days ago)      Saturday 4. SUDDEN: Gradual or sudden onset?     Sudden, gradually got worse 5. PATTERN Does the pain come and go, or is it constant?    - If it comes and goes: How long does it last? Do you have pain now?     (Note: Comes and goes means the pain is intermittent. It goes away completely between  bouts.)    - If constant: Is it getting better, staying the same, or getting worse?      (Note: Constant means the pain never goes away completely; most serious pain is constant and gets worse.)      Constant, worse with diarrhea 6. SEVERITY: How bad is the pain?  (e.g., Scale 1-10; mild, moderate, or severe)    - MILD (1-3): Doesn't interfere with normal activities, abdomen soft and not tender to touch.     - MODERATE (4-7): Interferes with normal activities or awakens from sleep, abdomen tender to touch.     - SEVERE (8-10): Excruciating pain, doubled over, unable to do any normal activities.       7/10 7. RECURRENT SYMPTOM: Have you ever had this type of stomach pain before? If Yes, ask: When was the last time? and What happened that time?      Yes 8. CAUSE: What do you think is causing the stomach pain?     Diverticulitis flare up 9. RELIEVING/AGGRAVATING FACTORS: What makes it better or worse? (e.g., antacids, bending or twisting motion, bowel movement)     A little better if I l lay down 10. OTHER SYMPTOMS: Do you have any other symptoms? (e.g., back pain, diarrhea, fever, urination pain, vomiting)       Diarrhea, tenderness, fever  Protocols used: Abdominal Pain - Male-A-AH

## 2023-08-10 ENCOUNTER — Ambulatory Visit: Payer: BLUE CROSS/BLUE SHIELD | Admitting: Family Medicine

## 2023-08-10 VITALS — BP 140/70 | HR 62 | Temp 98.0°F | Wt 183.3 lb

## 2023-08-10 DIAGNOSIS — K5792 Diverticulitis of intestine, part unspecified, without perforation or abscess without bleeding: Secondary | ICD-10-CM

## 2023-08-10 MED ORDER — CIPROFLOXACIN HCL 500 MG PO TABS: 500.0000 mg | ORAL_TABLET | Freq: Two times a day (BID) | ORAL | 0 refills | Status: DC

## 2023-08-10 MED ORDER — METRONIDAZOLE 500 MG PO TABS: 500.0000 mg | ORAL_TABLET | Freq: Three times a day (TID) | ORAL | 0 refills | Status: AC

## 2023-08-10 NOTE — Progress Notes (Signed)
   Established Patient Office Visit  Subjective   Patient ID: Matthew Patterson, male    DOB: [birth year]  Age: 68 y.o. MRN: [id number]  Chief Complaint  Patient presents with   Diverticulitis    HPI   Matthew Patterson is seen today with concern for possible acute diverticulitis.  He had acute diverticulitis 2021 confirmed by CT scan.  Symptoms now are similar though less severe.  Symptoms of actually improved a little bit since onset a few days ago.  Pain is left lower quadrant.  Denies any nausea, vomiting, fever.  Stool last episode was slightly softer than usual.  No bloody stool.  Appetite fair.  Has tolerated Cipro  and Flagyl  in the past.  Denies any dysuria.  Past Medical History:  Diagnosis Date   Anxiety and depression    Diabetes mellitus without complication (HCC)    GERD (gastroesophageal reflux disease)    Hyperlipidemia    Past Surgical History:  Procedure Laterality Date   ROTATOR CUFF REPAIR     SHOULDER SURGERY  2010   B/L    reports that he has never smoked. He has never used smokeless tobacco. He reports current alcohol use of about 1.0 - 2.0 standard drink of alcohol per week. He reports that he does not use drugs. family history includes Cancer in his sister; Diabetes in his mother; Hypertension in his mother. No Known Allergies  Review of Systems  Constitutional:  Negative for chills and fever.  Gastrointestinal:  Positive for abdominal pain. Negative for blood in stool, constipation, melena, nausea and vomiting.      Objective:     BP (!) 140/70 (BP Location: Left Arm, Patient Position: Sitting, Cuff Size: Normal)   Pulse 62   Temp 98 F (36.7 C) (Oral)   Wt 183 lb 4.8 oz (83.1 kg)   SpO2 100%   BMI 27.07 kg/m  BP Readings from Last 3 Encounters:  08/10/23 (!) 140/70  06/03/23 120/80  05/05/23 120/80   Wt Readings from Last 3 Encounters:  08/10/23 183 lb 4.8 oz (83.1 kg)  06/03/23 178 lb (80.7 kg)  05/05/23 176 lb (79.8 kg)      Physical  Exam Vitals reviewed.  Constitutional:      General: He is not in acute distress.    Appearance: He is not ill-appearing.  Cardiovascular:     Rate and Rhythm: Normal rate and regular rhythm.  Abdominal:     Palpations: Abdomen is soft.     Comments: Mild tenderness left lower quadrant to deep palpation.  No guarding or rebound.      No results found for any visits on 08/10/23.    The 10-year ASCVD risk score (Arnett DK, et al., 2019) is: 27%    Assessment & Plan:   Problem List Items Addressed This Visit   None Visit Diagnoses       Acute diverticulitis    -  Primary   Relevant Medications   ciprofloxacin  (CIPRO ) 500 MG tablet   metroNIDAZOLE  (FLAGYL ) 500 MG tablet     Suspected acute diverticulitis sigmoid region.  Nonacute abdomen. -Start Flagyl  500 mg 3 times daily for 7 days and avoid alcohol when taking this and also Cipro  500 mg twice daily for 7 days -Follow-up for any persistent or worsening symptoms -It appears he is overdue for colonoscopy and we have recommend he address this during next physical  No follow-ups on file.    Wolm Scarlet, MD

## 2023-08-18 DIAGNOSIS — L821 Other seborrheic keratosis: Secondary | ICD-10-CM | POA: Diagnosis not present

## 2023-08-18 DIAGNOSIS — Z85820 Personal history of malignant melanoma of skin: Secondary | ICD-10-CM | POA: Diagnosis not present

## 2023-08-18 DIAGNOSIS — Z85828 Personal history of other malignant neoplasm of skin: Secondary | ICD-10-CM | POA: Diagnosis not present

## 2023-08-18 DIAGNOSIS — L814 Other melanin hyperpigmentation: Secondary | ICD-10-CM | POA: Diagnosis not present

## 2023-09-09 ENCOUNTER — Other Ambulatory Visit: Payer: Self-pay | Admitting: Adult Health

## 2023-09-09 DIAGNOSIS — E118 Type 2 diabetes mellitus with unspecified complications: Secondary | ICD-10-CM

## 2023-09-09 DIAGNOSIS — H52223 Regular astigmatism, bilateral: Secondary | ICD-10-CM | POA: Diagnosis not present

## 2023-09-09 DIAGNOSIS — E119 Type 2 diabetes mellitus without complications: Secondary | ICD-10-CM | POA: Diagnosis not present

## 2023-09-09 DIAGNOSIS — H524 Presbyopia: Secondary | ICD-10-CM | POA: Diagnosis not present

## 2023-09-09 DIAGNOSIS — H5203 Hypermetropia, bilateral: Secondary | ICD-10-CM | POA: Diagnosis not present

## 2023-09-09 DIAGNOSIS — H2513 Age-related nuclear cataract, bilateral: Secondary | ICD-10-CM | POA: Diagnosis not present

## 2023-12-02 ENCOUNTER — Encounter: Payer: Self-pay | Admitting: Internal Medicine

## 2023-12-02 ENCOUNTER — Ambulatory Visit: Payer: Self-pay | Admitting: Internal Medicine

## 2023-12-02 VITALS — BP 120/70 | HR 64 | Ht 69.0 in | Wt 177.4 lb

## 2023-12-02 DIAGNOSIS — E785 Hyperlipidemia, unspecified: Secondary | ICD-10-CM | POA: Diagnosis not present

## 2023-12-02 DIAGNOSIS — E119 Type 2 diabetes mellitus without complications: Secondary | ICD-10-CM

## 2023-12-02 LAB — POCT GLUCOSE (DEVICE FOR HOME USE): Glucose Fasting, POC: 138 mg/dL — AB (ref 70–99)

## 2023-12-02 LAB — POCT GLYCOSYLATED HEMOGLOBIN (HGB A1C): Hemoglobin A1C: 6.2 % — AB (ref 4.0–5.6)

## 2023-12-02 MED ORDER — METFORMIN HCL ER 750 MG PO TB24: 750.0000 mg | ORAL_TABLET | Freq: Every day | ORAL | 3 refills | Status: DC

## 2023-12-02 NOTE — Progress Notes (Unsigned)
 Name: Matthew Patterson  MRN/ DOB: [id number], 1956/07/29   Age/ Sex: 68 y.o., male    PCP: Alto Atta, NP   Reason for Endocrinology Evaluation: Type 2 Diabetes Mellitus     Date of Initial Endocrinology Visit: 09/16/2021    PATIENT IDENTIFIER: Matthew Patterson is a 68 y.o. male with a past medical history of T2DM and dyslipidemia . The patient presented for initial endocrinology clinic visit on 09/16/2021 for consultative assistance with his diabetes management.    HPI: Matthew Patterson was    Diagnosed with DM 2018 Prior Medications tried/Intolerance: Has been on Metformin  for a few years . Ozempic  added 06/2021 Hemoglobin A1c has ranged from 6.6% in 2020, peaking at 11.9% in 2022.  On his initial visit to our clinic his A1c was 5.6%, he was already on metformin  and Ozempic  and we did not make any changes  Ozempic  stopped 10/2021  due to nausea and diarrhea    SUBJECTIVE:   During the last visit (06/03/2023): A1c 5.6%    Today (12/02/23): Matthew Patterson  is here for follow-up on diabetes management.  He checks his blood sugars multiple  times daily, through [facility], he has not used it in 3 weeks as the sensor fell off soon.    Denies nausea or vomiting  Denies constipation or diarrhea  He on a low diet diet   HOME DIABETES REGIMEN: Metformin  750 mg XR daily       Statin: yes ACE-I/ARB: yes     CONTINUOUS GLUCOSE MONITORING RECORD INTERPRETATION N/a    DIABETIC COMPLICATIONS: Microvascular complications:   Denies: CKD, neuropathy  Last eye exam: Completed 09/2023  Macrovascular complications:   Denies: CAD, PVD, CVA   PAST HISTORY: Past Medical History:  Past Medical History:  Diagnosis Date   Anxiety and depression    Diabetes mellitus without complication (HCC)    GERD (gastroesophageal reflux disease)    Hyperlipidemia    Past Surgical History:  Past Surgical History:  Procedure Laterality Date   ROTATOR CUFF REPAIR     SHOULDER  SURGERY  2010   B/L    Social History:  reports that he has never smoked. He has never used smokeless tobacco. He reports current alcohol use of about 1.0 - 2.0 standard drink of alcohol per week. He reports that he does not use drugs. Family History:  Family History  Problem Relation Age of Onset   Diabetes Mother    Hypertension Mother    Cancer Sister        uterine cancer     HOME MEDICATIONS: Allergies as of 12/02/2023   No Known Allergies      Medication List        Accurate as of Dec 02, 2023  8:01 AM. If you have any questions, ask your nurse or doctor.          atorvastatin  40 MG tablet Commonly known as: LIPITOR TAKE 1 TABLET BY MOUTH DAILY   ciprofloxacin  500 MG tablet Commonly known as: Cipro  Take 1 tablet (500 mg total) by mouth 2 (two) times daily.   fenofibrate  145 MG tablet Commonly known as: TRICOR  TAKE 1 TABLET BY MOUTH DAILY   FreeStyle Libre 3 Plus Sensor Misc 1 Device by Other route every 14 (fourteen) days. Change sensor every 15 days.   glucose blood test strip Use as instructed   lisinopril  2.5 MG tablet Commonly known as: ZESTRIL  TAKE 1 TABLET BY MOUTH DAILY   metFORMIN  750 MG 24  hr tablet Commonly known as: GLUCOPHAGE -XR Take 1 tablet (750 mg total) by mouth daily with breakfast.   onetouch ultrasoft lancets Use as instructed   OneTouch Verio IQ System w/Device Kit 1 kit   traZODone  100 MG tablet Commonly known as: DESYREL  TAKE 1 TABLET BY MOUTH AT BEDTIME         ALLERGIES: No Known Allergies   REVIEW OF SYSTEMS: A comprehensive ROS was conducted with the patient and is negative except as per HPI   OBJECTIVE:   VITAL SIGNS: BP 120/70 (BP Location: Left Arm, Patient Position: Sitting, Cuff Size: Small)   Pulse 64   Ht 5\' 9"  (1.753 m)   Wt 177 lb 6.4 oz (80.5 kg)   SpO2 99%   BMI 26.20 kg/m    PHYSICAL EXAM:  General: Pt appears well and is in NAD  Lungs: Clear with good BS bilat  Heart: RRR   Abdomen:  soft, nontender  Extremities:  Lower extremities - No pretibial edema.  Neuro: MS is good with appropriate affect, pt is alert and Ox3    DM foot exam: 06/03/2023  The skin of the feet is intact without sores or ulcerations. The pedal pulses are 2+ on right and 2+ on left. The sensation is intact to a screening 5.07, 10 gram monofilament bilaterally      DATA REVIEWED:  Lab Results  Component Value Date   HGBA1C 6.2 (A) 12/02/2023   HGBA1C 5.9 (A) 06/03/2023   HGBA1C 5.6 11/26/2022     Latest Reference Range & Units 12/02/23 08:26  Total CHOL/HDL Ratio <5.0 (calc) 3.4  Cholesterol <200 mg/dL 161 (H)  HDL Cholesterol > OR = 40 mg/dL 61  LDL Cholesterol (Calc) mg/dL (calc) 096 (H)  Non-HDL Cholesterol (Calc) <130 mg/dL (calc) 045 (H)  Triglycerides <150 mg/dL 409     Latest Reference Range & Units 05/05/23 08:00  Sodium 135 - 145 mEq/L 142  Potassium 3.5 - 5.1 mEq/L 4.6  Chloride 96 - 112 mEq/L 106  CO2 19 - 32 mEq/L 28  Glucose 70 - 99 mg/dL 811 (H)  BUN 6 - 23 mg/dL 21  Creatinine 9.14 - 7.82 mg/dL 9.56  Calcium  8.4 - 10.5 mg/dL 9.2  Alkaline Phosphatase 39 - 117 U/L 30 (L)  Albumin 3.5 - 5.2 g/dL 4.3  AST 0 - 37 U/L 23  ALT 0 - 53 U/L 23  Total Protein 6.0 - 8.3 g/dL 6.6  Total Bilirubin 0.2 - 1.2 mg/dL 0.7  GFR >21.30 mL/min 53.40 (L)  Total CHOL/HDL Ratio  3  Cholesterol 0 - 200 mg/dL 865  HDL Cholesterol >78.46 mg/dL 96.29  LDL (calc) 0 - 99 mg/dL 528 (H)  MICROALB/CREAT RATIO 0.0 - 30.0 mg/g 0.7  NonHDL  134.63  Triglycerides 0.0 - 149.0 mg/dL 41.3  VLDL 0.0 - 24.4 mg/dL 01.0     Latest Reference Range & Units 05/05/23 08:00  Creatinine,U mg/dL 272.5  Microalb, Ur 0.0 - 1.9 mg/dL <3.6  MICROALB/CREAT RATIO 0.0 - 30.0 mg/g 0.7   In office BG 138 mg/dL    ASSESSMENT / PLAN / RECOMMENDATIONS:   1) Type 2 Diabetes Mellitus, Optimally controlled, Without complications - Most recent A1c of 6.2 %. Goal A1c < 7.0 %.    -His A1c remains optimal -We  held Ozempic  due to nausea and diarrhea - No change   MEDICATIONS: Continue  Metformin  750 mg XR daily   EDUCATION / INSTRUCTIONS: BG monitoring instructions: Patient is instructed to check his blood sugars  3 times a day, before meals . Call [facility] if: BG persistently < 70 I reviewed the Rule of 15 for the treatment of hypoglycemia in detail with the patient. Literature supplied.   2) Diabetic complications:  Eye: Does not have known diabetic retinopathy.  Neuro/ Feet: Does not have known diabetic peripheral neuropathy. Renal: Patient does not have known baseline CKD. He is  on an ACEI/ARB at present.   3) Dyslipidemia:  -LDL has increased from 80 Mg/DL to 213 mg/DL on last labs  -Encourage low-fat diet as well as regular exercise - Lipid panel today continues to show elevated LDL, will switch atorvastatin  to rosuvastatin  Medication Stop atorvastatin  40 mg daily Start rosuvastatin 40 mg daily    F/U in 6 months     Signed electronically by: Natale Bail, MD  [facility]  [facility] [address] [place] [place], [place] [address] Phone: [phone] FAX: [phone]   CC: Alto Atta, NP [address] [place] [address] Phone: [phone]  Fax: [phone]    Return to Endocrinology clinic as below: Future Appointments  Date Time Provider Department Center  05/05/2024  7:00 AM Annemarie Barry, Randel Buss, NP [facility]

## 2023-12-02 NOTE — Patient Instructions (Addendum)
 Continue Metformin  750 mg XR daily    HOW TO TREAT LOW BLOOD SUGARS (Blood sugar LESS THAN 70 MG/DL) Please follow the RULE OF 15 for the treatment of hypoglycemia treatment (when your (blood sugars are less than 70 mg/dL)   STEP 1: Take 15 grams of carbohydrates when your blood sugar is low, which includes:  3-4 GLUCOSE TABS  OR 3-4 OZ OF JUICE OR REGULAR SODA OR ONE TUBE OF GLUCOSE GEL    STEP 2: RECHECK blood sugar in 15 MINUTES STEP 3: If your blood sugar is still low at the 15 minute recheck --> then, go back to STEP 1 and treat AGAIN with another 15 grams of carbohydrates.

## 2023-12-03 LAB — LIPID PANEL
Cholesterol: 205 mg/dL — ABNORMAL HIGH (ref <200)
HDL: 61 mg/dL (ref >=40)
LDL Cholesterol (Calc): 119 mg/dL — ABNORMAL HIGH
Non-HDL Cholesterol (Calc): 144 mg/dL — ABNORMAL HIGH (ref <130)
Total CHOL/HDL Ratio: 3.4 (calc) (ref <5.0)
Triglycerides: 142 mg/dL (ref <150)

## 2023-12-06 ENCOUNTER — Encounter: Payer: Self-pay | Admitting: Internal Medicine

## 2023-12-06 ENCOUNTER — Other Ambulatory Visit: Payer: Self-pay | Admitting: Adult Health

## 2023-12-06 DIAGNOSIS — E118 Type 2 diabetes mellitus with unspecified complications: Secondary | ICD-10-CM

## 2023-12-06 MED ORDER — ROSUVASTATIN CALCIUM 40 MG PO TABS: 40.0000 mg | ORAL_TABLET | Freq: Every day | ORAL | 3 refills | Status: DC

## 2023-12-16 ENCOUNTER — Other Ambulatory Visit: Payer: Self-pay | Admitting: Adult Health

## 2023-12-16 DIAGNOSIS — G4700 Insomnia, unspecified: Secondary | ICD-10-CM

## 2024-01-31 DIAGNOSIS — L814 Other melanin hyperpigmentation: Secondary | ICD-10-CM | POA: Diagnosis not present

## 2024-01-31 DIAGNOSIS — L821 Other seborrheic keratosis: Secondary | ICD-10-CM | POA: Diagnosis not present

## 2024-01-31 DIAGNOSIS — D692 Other nonthrombocytopenic purpura: Secondary | ICD-10-CM | POA: Diagnosis not present

## 2024-01-31 DIAGNOSIS — Z85828 Personal history of other malignant neoplasm of skin: Secondary | ICD-10-CM | POA: Diagnosis not present

## 2024-01-31 DIAGNOSIS — D0462 Carcinoma in situ of skin of left upper limb, including shoulder: Secondary | ICD-10-CM | POA: Diagnosis not present

## 2024-02-21 DIAGNOSIS — D0462 Carcinoma in situ of skin of left upper limb, including shoulder: Secondary | ICD-10-CM | POA: Diagnosis not present

## 2024-03-12 ENCOUNTER — Other Ambulatory Visit: Payer: Self-pay | Admitting: Adult Health

## 2024-03-12 DIAGNOSIS — E118 Type 2 diabetes mellitus with unspecified complications: Secondary | ICD-10-CM

## 2024-05-05 ENCOUNTER — Ambulatory Visit (INDEPENDENT_AMBULATORY_CARE_PROVIDER_SITE_OTHER): Payer: BLUE CROSS/BLUE SHIELD | Admitting: Adult Health

## 2024-05-05 ENCOUNTER — Encounter: Payer: Self-pay | Admitting: Adult Health

## 2024-05-05 VITALS — BP 120/80 | HR 50 | Temp 98.2°F | Ht 68.75 in | Wt 179.0 lb

## 2024-05-05 DIAGNOSIS — Z85820 Personal history of malignant melanoma of skin: Secondary | ICD-10-CM

## 2024-05-05 DIAGNOSIS — E782 Mixed hyperlipidemia: Secondary | ICD-10-CM | POA: Diagnosis not present

## 2024-05-05 DIAGNOSIS — E119 Type 2 diabetes mellitus without complications: Secondary | ICD-10-CM | POA: Diagnosis not present

## 2024-05-05 DIAGNOSIS — Z0000 Encounter for general adult medical examination without abnormal findings: Secondary | ICD-10-CM

## 2024-05-05 DIAGNOSIS — F5101 Primary insomnia: Secondary | ICD-10-CM | POA: Diagnosis not present

## 2024-05-05 DIAGNOSIS — Z7984 Long term (current) use of oral hypoglycemic drugs: Secondary | ICD-10-CM | POA: Diagnosis not present

## 2024-05-05 DIAGNOSIS — Z1211 Encounter for screening for malignant neoplasm of colon: Secondary | ICD-10-CM

## 2024-05-05 DIAGNOSIS — Z125 Encounter for screening for malignant neoplasm of prostate: Secondary | ICD-10-CM | POA: Diagnosis not present

## 2024-05-05 LAB — COMPREHENSIVE METABOLIC PANEL WITH GFR
ALT: 20 U/L (ref 0–53)
AST: 19 U/L (ref 0–37)
Albumin: 4.3 g/dL (ref 3.5–5.2)
Alkaline Phosphatase: 28 U/L — ABNORMAL LOW (ref 39–117)
BUN: 23 mg/dL (ref 6–23)
CO2: 27 meq/L (ref 19–32)
Calcium: 9.4 mg/dL (ref 8.4–10.5)
Chloride: 107 meq/L (ref 96–112)
Creatinine, Ser: 1.39 mg/dL (ref 0.40–1.50)
GFR: 52.11 mL/min — ABNORMAL LOW (ref >60.00)
Glucose, Bld: 121 mg/dL — ABNORMAL HIGH (ref 70–99)
Potassium: 4.1 meq/L (ref 3.5–5.1)
Sodium: 142 meq/L (ref 135–145)
Total Bilirubin: 0.6 mg/dL (ref 0.2–1.2)
Total Protein: 6.6 g/dL (ref 6.0–8.3)

## 2024-05-05 LAB — CBC
HCT: 42.2 % (ref 39.0–52.0)
Hemoglobin: 14.2 g/dL (ref 13.0–17.0)
MCHC: 33.6 g/dL (ref 30.0–36.0)
MCV: 94.3 fl (ref 78.0–100.0)
Platelets: 181 K/uL (ref 150.0–400.0)
RBC: 4.48 Mil/uL (ref 4.22–5.81)
RDW: 12.7 % (ref 11.5–15.5)
WBC: 4.4 K/uL (ref 4.0–10.5)

## 2024-05-05 LAB — LIPID PANEL
Cholesterol: 142 mg/dL (ref 0–200)
HDL: 52.5 mg/dL (ref >39.00)
LDL Cholesterol: 71 mg/dL (ref 0–99)
NonHDL: 89.46
Total CHOL/HDL Ratio: 3
Triglycerides: 93 mg/dL (ref 0.0–149.0)
VLDL: 18.6 mg/dL (ref 0.0–40.0)

## 2024-05-05 LAB — MICROALBUMIN / CREATININE URINE RATIO
Creatinine,U: 192.4 mg/dL
Microalb Creat Ratio: 5.6 mg/g (ref 0.0–30.0)
Microalb, Ur: 1.1 mg/dL (ref 0.0–1.9)

## 2024-05-05 LAB — TSH: TSH: 2.58 u[IU]/mL (ref 0.35–5.50)

## 2024-05-05 LAB — PSA: PSA: 0.72 ng/mL (ref 0.10–4.00)

## 2024-05-05 NOTE — Patient Instructions (Signed)
 It was great seeing you today   We will follow up with you regarding your lab work   Please let me know if you need anything

## 2024-05-05 NOTE — Progress Notes (Signed)
 Subjective:    Patient ID: Matthew Patterson, male    DOB: 10-11-1955, 68 y.o.   MRN: [id number]  HPI Patient presents for yearly preventative medicine examination. He is a pleasant 68 year old male who  has a past medical history of Anxiety and depression, Diabetes mellitus without complication (HCC), GERD (gastroesophageal reflux disease), Hyperlipidemia, and Melanoma (HCC) (2023).  DM type 2 -managed by endocrinology.  He uses the freestyle libre CGM to manage his blood sugars. His current regimen includes that of Metformin  750 mg XR. He is on lisinopril  2.5 mg for kidney protection.  Lab Results  Component Value Date   HGBA1C 6.2 (A) 12/02/2023   HGBA1C 5.9 (A) 06/03/2023   HGBA1C 5.6 11/26/2022   Hyperlipidemia -managed with fenofibrate  145 mg daily. Lipitor 40 mg Lab Results  Component Value Date   CHOL 205 (H) 12/02/2023   HDL 61 12/02/2023   LDLCALC 119 (H) 12/02/2023   LDLDIRECT 156.0 04/08/2021   TRIG 142 12/02/2023   CHOLHDL 3.4 12/02/2023   Insomnia-takes trazodone  100 mg nightly  Melanoma - reports having two areas of melanoma removed from his back in 2023. He is going to see Dermatology every 6 months. He had skin cancer removed ) unsure of what kind) on his left forearm and back earlier this summer   All immunizations and health maintenance protocols were reviewed with the patient and needed orders were placed.  Appropriate screening laboratory values were ordered for the patient including screening of hyperlipidemia, renal function and hepatic function. If indicated by BPH, a PSA was ordered.  Medication reconciliation,  past medical history, social history, problem list and allergies were reviewed in detail with the patient  Goals were established with regard to weight loss, exercise, and  diet in compliance with medications  He is due for cologuard screening    Review of Systems  Constitutional: Negative.   HENT: Negative.    Eyes: Negative.    Respiratory: Negative.    Cardiovascular: Negative.   Gastrointestinal: Negative.   Endocrine: Negative.   Genitourinary: Negative.   Musculoskeletal: Negative.   Skin: Negative.   Allergic/Immunologic: Negative.   Neurological: Negative.   Hematological: Negative.   Psychiatric/Behavioral: Negative.    All other systems reviewed and are negative.  Past Medical History:  Diagnosis Date   Anxiety and depression    Diabetes mellitus without complication (HCC)    GERD (gastroesophageal reflux disease)    Hyperlipidemia    Melanoma (HCC) 2023   back    Social History   Socioeconomic History   Marital status: Married    Spouse name: Not on file   Number of children: Not on file   Years of education: Not on file   Highest education level: Associate degree: academic program  Occupational History   Not on file  Tobacco Use   Smoking status: Never   Smokeless tobacco: Never  Substance and Sexual Activity   Alcohol use: Yes    Alcohol/week: 1.0 - 2.0 standard drink of alcohol    Types: 1 - 2 Cans of beer per week   Drug use: No   Sexual activity: Not on file  Other Topics Concern   Not on file  Social History Narrative   Senior buyer for manufacturing    Married   One children    One grandchild      He likes to play golf    Social Drivers of Health   Financial Resource Strain: Low Risk  (08/10/2023)  Overall Financial Resource Strain (CARDIA)    Difficulty of Paying Living Expenses: Not hard at all  Food Insecurity: No Food Insecurity (08/10/2023)   Hunger Vital Sign    Worried About Running Out of Food in the Last Year: Never true    Ran Out of Food in the Last Year: Never true  Transportation Needs: No Transportation Needs (08/10/2023)   PRAPARE - Administrator, Civil Service (Medical): No    Lack of Transportation (Non-Medical): No  Physical Activity: Insufficiently Active (08/10/2023)   Exercise Vital Sign    Days of Exercise per Week: 5 days     Minutes of Exercise per Session: 20 min  Stress: No Stress Concern Present (08/10/2023)   [employer] of Occupational Health - Occupational Stress Questionnaire    Feeling of Stress : Only a little  Social Connections: Socially Integrated (08/10/2023)   Social Connection and Isolation Panel    Frequency of Communication with Friends and Family: Twice a week    Frequency of Social Gatherings with Friends and Family: Once a week    Attends Religious Services: More than 4 times per year    Active Member of [employer] or Organizations: Yes    Attends Engineer, structural: More than 4 times per year    Marital Status: Married  Catering manager Violence: Not on file    Past Surgical History:  Procedure Laterality Date   ROTATOR CUFF REPAIR     SHOULDER SURGERY  2010   B/L    Family History  Problem Relation Age of Onset   Diabetes Mother    Hypertension Mother    Cancer Sister        uterine cancer    No Known Allergies  Current Outpatient Medications on File Prior to Visit  Medication Sig Dispense Refill   Blood Glucose Monitoring Suppl (ONETOUCH VERIO IQ SYSTEM) w/Device KIT 1 kit 1 kit 0   Continuous Glucose Sensor (FREESTYLE LIBRE 3 PLUS SENSOR) MISC 1 Device by Other route every 14 (fourteen) days. Change sensor every 15 days. 6 each 3   fenofibrate  (TRICOR ) 145 MG tablet TAKE 1 TABLET BY MOUTH DAILY 90 tablet 3   glucose blood test strip Use as instructed 100 each 12   Lancets (ONETOUCH ULTRASOFT) lancets Use as instructed 100 each 12   lisinopril  (ZESTRIL ) 2.5 MG tablet TAKE 1 TABLET BY MOUTH DAILY 90 tablet 0   metFORMIN  (GLUCOPHAGE -XR) 750 MG 24 hr tablet Take 1 tablet (750 mg total) by mouth daily with breakfast. 90 tablet 3   rosuvastatin  (CRESTOR ) 40 MG tablet Take 1 tablet (40 mg total) by mouth daily. 90 tablet 3   traZODone  (DESYREL ) 100 MG tablet TAKE 1 TABLET BY MOUTH AT BEDTIME 90 tablet 1   No current facility-administered medications on file prior to  visit.    BP 120/80   Pulse (!) 50   Temp 98.2 F (36.8 C) (Oral)   Ht 5' 8.75 (1.746 m)   Wt 179 lb (81.2 kg)   SpO2 96%   BMI 26.63 kg/m       Objective:   Physical Exam Vitals and nursing note reviewed.  Constitutional:      General: He is not in acute distress.    Appearance: Normal appearance. He is not ill-appearing.  HENT:     Head: Normocephalic and atraumatic.     Right Ear: Tympanic membrane, ear canal and external ear normal. There is no impacted cerumen.  Left Ear: Tympanic membrane, ear canal and external ear normal. There is no impacted cerumen.     Nose: Nose normal. No congestion or rhinorrhea.     Mouth/Throat:     Mouth: Mucous membranes are moist.     Pharynx: Oropharynx is clear.  Eyes:     Extraocular Movements: Extraocular movements intact.     Conjunctiva/sclera: Conjunctivae normal.     Pupils: Pupils are equal, round, and reactive to light.  Neck:     Vascular: No carotid bruit.  Cardiovascular:     Rate and Rhythm: Normal rate and regular rhythm.     Pulses: Normal pulses.     Heart sounds: No murmur heard.    No friction rub. No gallop.  Pulmonary:     Effort: Pulmonary effort is normal.     Breath sounds: Normal breath sounds.  Abdominal:     General: Abdomen is flat. Bowel sounds are normal. There is no distension.     Palpations: Abdomen is soft. There is no mass.     Tenderness: There is no abdominal tenderness. There is no guarding or rebound.     Hernia: No hernia is present.  Musculoskeletal:        General: Normal range of motion.     Cervical back: Normal range of motion and neck supple.  Lymphadenopathy:     Cervical: No cervical adenopathy.  Skin:    General: Skin is warm and dry.     Capillary Refill: Capillary refill takes less than 2 seconds.  Neurological:     General: No focal deficit present.     Mental Status: He is alert and oriented to person, place, and time.  Psychiatric:        Mood and Affect: Mood  normal.        Behavior: Behavior normal.        Thought Content: Thought content normal.        Judgment: Judgment normal.       Assessment & Plan:  1. Routine general medical examination at a health care facility (Primary) Today patient counseled on age appropriate routine health concerns for screening and prevention, each reviewed and up to date or declined. Immunizations reviewed and up to date or declined. Labs ordered and reviewed. Risk factors for depression reviewed and negative. Hearing function and visual acuity are intact. ADLs screened and addressed as needed. Functional ability and level of safety reviewed and appropriate. Education, counseling and referrals performed based on assessed risks today. Patient provided with a copy of personalized plan for preventive services. - Refused vaccinations today  - Stay active and eat healthy  - Follow up in one year or sooner if needed  2. Diabetes mellitus treated with oral medication (HCC) - Per endocrinology  - Lipid panel; Future - TSH; Future - CBC; Future - Comprehensive metabolic panel with GFR; Future - Microalbumin/Creatinine Ratio, Urine; Future  3. Mixed hyperlipidemia - Consider increase in statin  - Lipid panel; Future - TSH; Future - CBC; Future - Comprehensive metabolic panel with GFR; Future  4. Primary insomnia - Continue with Trazodone   - Lipid panel; Future - TSH; Future - CBC; Future - Comprehensive metabolic panel with GFR; Future  5. History of melanoma - Per Dermatology   6. Prostate cancer screening  - PSA; Future  7. Screening for colon cancer  - Cologuard; Future  Darleene Shape, NP

## 2024-05-09 ENCOUNTER — Ambulatory Visit: Payer: Self-pay | Admitting: Adult Health

## 2024-05-17 ENCOUNTER — Emergency Department (HOSPITAL_BASED_OUTPATIENT_CLINIC_OR_DEPARTMENT_OTHER)

## 2024-05-17 ENCOUNTER — Inpatient Hospital Stay (HOSPITAL_BASED_OUTPATIENT_CLINIC_OR_DEPARTMENT_OTHER)
Admission: EM | Admit: 2024-05-17 | Discharge: 2024-05-28 | DRG: 234 | Disposition: A | Discharge status: Skilled Nursing Facility | Attending: Thoracic Surgery (Cardiothoracic Vascular Surgery) | Admitting: Thoracic Surgery (Cardiothoracic Vascular Surgery)

## 2024-05-17 ENCOUNTER — Other Ambulatory Visit: Payer: Self-pay

## 2024-05-17 ENCOUNTER — Encounter (HOSPITAL_BASED_OUTPATIENT_CLINIC_OR_DEPARTMENT_OTHER): Payer: Self-pay

## 2024-05-17 DIAGNOSIS — J939 Pneumothorax, unspecified: Secondary | ICD-10-CM | POA: Diagnosis not present

## 2024-05-17 DIAGNOSIS — I081 Rheumatic disorders of both mitral and tricuspid valves: Secondary | ICD-10-CM | POA: Diagnosis not present

## 2024-05-17 DIAGNOSIS — D6959 Other secondary thrombocytopenia: Secondary | ICD-10-CM | POA: Diagnosis not present

## 2024-05-17 DIAGNOSIS — Z85820 Personal history of malignant melanoma of skin: Secondary | ICD-10-CM | POA: Diagnosis not present

## 2024-05-17 DIAGNOSIS — R079 Chest pain, unspecified: Secondary | ICD-10-CM | POA: Diagnosis present

## 2024-05-17 DIAGNOSIS — Z743 Need for continuous supervision: Secondary | ICD-10-CM | POA: Diagnosis not present

## 2024-05-17 DIAGNOSIS — R42 Dizziness and giddiness: Secondary | ICD-10-CM | POA: Diagnosis not present

## 2024-05-17 DIAGNOSIS — E1159 Type 2 diabetes mellitus with other circulatory complications: Secondary | ICD-10-CM | POA: Diagnosis not present

## 2024-05-17 DIAGNOSIS — G4700 Insomnia, unspecified: Secondary | ICD-10-CM | POA: Diagnosis present

## 2024-05-17 DIAGNOSIS — M1990 Unspecified osteoarthritis, unspecified site: Secondary | ICD-10-CM | POA: Diagnosis present

## 2024-05-17 DIAGNOSIS — I2089 Other forms of angina pectoris: Secondary | ICD-10-CM | POA: Diagnosis not present

## 2024-05-17 DIAGNOSIS — F17290 Nicotine dependence, other tobacco product, uncomplicated: Secondary | ICD-10-CM | POA: Diagnosis present

## 2024-05-17 DIAGNOSIS — Z8249 Family history of ischemic heart disease and other diseases of the circulatory system: Secondary | ICD-10-CM | POA: Diagnosis not present

## 2024-05-17 DIAGNOSIS — E782 Mixed hyperlipidemia: Secondary | ICD-10-CM | POA: Diagnosis present

## 2024-05-17 DIAGNOSIS — K2100 Gastro-esophageal reflux disease with esophagitis, without bleeding: Secondary | ICD-10-CM | POA: Diagnosis present

## 2024-05-17 DIAGNOSIS — I10 Essential (primary) hypertension: Secondary | ICD-10-CM | POA: Diagnosis present

## 2024-05-17 DIAGNOSIS — Z79899 Other long term (current) drug therapy: Secondary | ICD-10-CM | POA: Diagnosis not present

## 2024-05-17 DIAGNOSIS — Z951 Presence of aortocoronary bypass graft: Secondary | ICD-10-CM | POA: Diagnosis present

## 2024-05-17 DIAGNOSIS — R0989 Other specified symptoms and signs involving the circulatory and respiratory systems: Secondary | ICD-10-CM | POA: Diagnosis not present

## 2024-05-17 DIAGNOSIS — Z48812 Encounter for surgical aftercare following surgery on the circulatory system: Secondary | ICD-10-CM | POA: Diagnosis not present

## 2024-05-17 DIAGNOSIS — I4891 Unspecified atrial fibrillation: Secondary | ICD-10-CM | POA: Diagnosis not present

## 2024-05-17 DIAGNOSIS — E781 Pure hyperglyceridemia: Secondary | ICD-10-CM | POA: Diagnosis not present

## 2024-05-17 DIAGNOSIS — Z9911 Dependence on respirator [ventilator] status: Secondary | ICD-10-CM

## 2024-05-17 DIAGNOSIS — I2584 Coronary atherosclerosis due to calcified coronary lesion: Secondary | ICD-10-CM | POA: Diagnosis not present

## 2024-05-17 DIAGNOSIS — Z7984 Long term (current) use of oral hypoglycemic drugs: Secondary | ICD-10-CM | POA: Diagnosis not present

## 2024-05-17 DIAGNOSIS — E785 Hyperlipidemia, unspecified: Secondary | ICD-10-CM | POA: Diagnosis not present

## 2024-05-17 DIAGNOSIS — D62 Acute posthemorrhagic anemia: Secondary | ICD-10-CM | POA: Diagnosis not present

## 2024-05-17 DIAGNOSIS — F419 Anxiety disorder, unspecified: Secondary | ICD-10-CM | POA: Diagnosis present

## 2024-05-17 DIAGNOSIS — Z833 Family history of diabetes mellitus: Secondary | ICD-10-CM | POA: Diagnosis not present

## 2024-05-17 DIAGNOSIS — E1151 Type 2 diabetes mellitus with diabetic peripheral angiopathy without gangrene: Secondary | ICD-10-CM | POA: Diagnosis present

## 2024-05-17 DIAGNOSIS — N189 Chronic kidney disease, unspecified: Secondary | ICD-10-CM | POA: Diagnosis present

## 2024-05-17 DIAGNOSIS — M5030 Other cervical disc degeneration, unspecified cervical region: Secondary | ICD-10-CM | POA: Diagnosis present

## 2024-05-17 DIAGNOSIS — J90 Pleural effusion, not elsewhere classified: Secondary | ICD-10-CM | POA: Diagnosis not present

## 2024-05-17 DIAGNOSIS — I6522 Occlusion and stenosis of left carotid artery: Secondary | ICD-10-CM | POA: Diagnosis present

## 2024-05-17 DIAGNOSIS — I25110 Atherosclerotic heart disease of native coronary artery with unstable angina pectoris: Principal | ICD-10-CM | POA: Diagnosis present

## 2024-05-17 DIAGNOSIS — R001 Bradycardia, unspecified: Secondary | ICD-10-CM | POA: Diagnosis not present

## 2024-05-17 DIAGNOSIS — R931 Abnormal findings on diagnostic imaging of heart and coronary circulation: Secondary | ICD-10-CM | POA: Diagnosis not present

## 2024-05-17 DIAGNOSIS — Z01810 Encounter for preprocedural cardiovascular examination: Secondary | ICD-10-CM | POA: Diagnosis not present

## 2024-05-17 DIAGNOSIS — N179 Acute kidney failure, unspecified: Secondary | ICD-10-CM | POA: Diagnosis present

## 2024-05-17 DIAGNOSIS — F17210 Nicotine dependence, cigarettes, uncomplicated: Secondary | ICD-10-CM | POA: Diagnosis present

## 2024-05-17 DIAGNOSIS — E119 Type 2 diabetes mellitus without complications: Secondary | ICD-10-CM

## 2024-05-17 DIAGNOSIS — R0789 Other chest pain: Secondary | ICD-10-CM | POA: Diagnosis not present

## 2024-05-17 DIAGNOSIS — I517 Cardiomegaly: Secondary | ICD-10-CM | POA: Diagnosis not present

## 2024-05-17 DIAGNOSIS — I2510 Atherosclerotic heart disease of native coronary artery without angina pectoris: Secondary | ICD-10-CM | POA: Diagnosis not present

## 2024-05-17 DIAGNOSIS — J9811 Atelectasis: Secondary | ICD-10-CM | POA: Diagnosis not present

## 2024-05-17 DIAGNOSIS — F418 Other specified anxiety disorders: Secondary | ICD-10-CM | POA: Diagnosis not present

## 2024-05-17 LAB — CBC WITH DIFFERENTIAL/PLATELET
Abs Immature Granulocytes: 0.02 K/uL (ref 0.00–0.07)
Basophils Absolute: 0 K/uL (ref 0.0–0.1)
Basophils Relative: 1 %
Eosinophils Absolute: 0.1 K/uL (ref 0.0–0.5)
Eosinophils Relative: 2 %
HCT: 39.4 % (ref 39.0–52.0)
Hemoglobin: 13.5 g/dL (ref 13.0–17.0)
Immature Granulocytes: 0 %
Lymphocytes Relative: 19 %
Lymphs Abs: 0.9 K/uL (ref 0.7–4.0)
MCH: 31.8 pg (ref 26.0–34.0)
MCHC: 34.3 g/dL (ref 30.0–36.0)
MCV: 92.9 fL (ref 80.0–100.0)
Monocytes Absolute: 0.2 K/uL (ref 0.1–1.0)
Monocytes Relative: 5 %
Neutro Abs: 3.5 K/uL (ref 1.7–7.7)
Neutrophils Relative %: 73 %
Platelets: 176 K/uL (ref 150–400)
RBC: 4.24 MIL/uL (ref 4.22–5.81)
RDW: 12.3 % (ref 11.5–15.5)
WBC: 4.7 K/uL (ref 4.0–10.5)
nRBC: 0 % (ref 0.0–0.2)

## 2024-05-17 LAB — COMPREHENSIVE METABOLIC PANEL WITH GFR
ALT: 19 U/L (ref 0–44)
AST: 23 U/L (ref 15–41)
Albumin: 4.2 g/dL (ref 3.5–5.0)
Alkaline Phosphatase: 34 U/L — ABNORMAL LOW (ref 38–126)
Anion gap: 12 (ref 5–15)
BUN: 28 mg/dL — ABNORMAL HIGH (ref 8–23)
CO2: 22 mmol/L (ref 22–32)
Calcium: 9 mg/dL (ref 8.9–10.3)
Chloride: 106 mmol/L (ref 98–111)
Creatinine, Ser: 1.55 mg/dL — ABNORMAL HIGH (ref 0.61–1.24)
GFR, Estimated: 48 mL/min — ABNORMAL LOW (ref >60)
Glucose, Bld: 214 mg/dL — ABNORMAL HIGH (ref 70–99)
Potassium: 4.1 mmol/L (ref 3.5–5.1)
Sodium: 140 mmol/L (ref 135–145)
Total Bilirubin: 0.4 mg/dL (ref 0.0–1.2)
Total Protein: 6.5 g/dL (ref 6.5–8.1)

## 2024-05-17 LAB — TROPONIN T, HIGH SENSITIVITY: Troponin T High Sensitivity: 18 ng/L (ref 0–19)

## 2024-05-17 MED ORDER — ASPIRIN 81 MG PO CHEW
324.0000 mg | CHEWABLE_TABLET | Freq: Once | ORAL | Status: AC
Start: 2024-05-18 — End: 2024-05-17
  Administered 2024-05-17: 324 mg via ORAL
  Filled 2024-05-17: qty 4

## 2024-05-17 NOTE — ED Provider Notes (Signed)
 [facility] Provider Note   CSN: [id number] Arrival date & time: 05/17/24  2232     Patient presents with: Shortness of Breath and Chest Pain   Matthew Patterson is a 68 y.o. male.   Patient presents to the emergency department for evaluation of chest pain.  Patient reports that for the last few weeks he has noticed that he gets dizzy, short of breath and chest pain radiating into his back when he exerts himself.  It gets better when he rests.  Symptoms worsened tonight while he was working in the garden.  At arrival to the ED, no pain.       Prior to Admission medications   Medication Sig Start Date End Date Taking? Authorizing Provider  Blood Glucose Monitoring Suppl (ONETOUCH VERIO IQ SYSTEM) w/Device KIT 1 kit 12/29/16   Nafziger, Darleene, NP  Continuous Glucose Sensor (FREESTYLE LIBRE 3 PLUS SENSOR) MISC 1 Device by Other route every 14 (fourteen) days. Change sensor every 15 days. 06/03/23   Shamleffer, Ibtehal Jaralla, MD  fenofibrate  (TRICOR ) 145 MG tablet TAKE 1 TABLET BY MOUTH DAILY 06/23/23   Nafziger, Darleene, NP  glucose blood test strip Use as instructed 12/29/16   Merna Darleene, NP  Lancets [facility] ULTRASOFT) lancets Use as instructed 12/29/16   Merna Darleene, NP  lisinopril  (ZESTRIL ) 2.5 MG tablet TAKE 1 TABLET BY MOUTH DAILY 03/22/24   Merna Darleene, NP  metFORMIN  (GLUCOPHAGE -XR) 750 MG 24 hr tablet Take 1 tablet (750 mg total) by mouth daily with breakfast. 12/02/23   Shamleffer, Donell Cardinal, MD  rosuvastatin  (CRESTOR ) 40 MG tablet Take 1 tablet (40 mg total) by mouth daily. 12/06/23   Shamleffer, Ibtehal Jaralla, MD  traZODone  (DESYREL ) 100 MG tablet TAKE 1 TABLET BY MOUTH AT BEDTIME 12/16/23   Nafziger, Cory, NP    Allergies: Patient has no known allergies.    Review of Systems  Updated Vital Signs BP (!) 143/83   Pulse 60   Temp 97.6 F (36.4 C) (Oral)   Resp 18   Ht 5' 8 (1.727 m)   Wt 80.3 kg   SpO2 99%   BMI  26.91 kg/m   Physical Exam Vitals and nursing note reviewed.  Constitutional:      General: He is not in acute distress.    Appearance: He is well-developed.  HENT:     Head: Normocephalic and atraumatic.     Mouth/Throat:     Mouth: Mucous membranes are moist.  Eyes:     General: Vision grossly intact. Gaze aligned appropriately.     Extraocular Movements: Extraocular movements intact.     Conjunctiva/sclera: Conjunctivae normal.  Cardiovascular:     Rate and Rhythm: Normal rate and regular rhythm.     Pulses: Normal pulses.     Heart sounds: Normal heart sounds, S1 normal and S2 normal. No murmur heard.    No friction rub. No gallop.  Pulmonary:     Effort: Pulmonary effort is normal. No respiratory distress.     Breath sounds: Normal breath sounds.  Abdominal:     Palpations: Abdomen is soft.     Tenderness: There is no abdominal tenderness. There is no guarding or rebound.     Hernia: No hernia is present.  Musculoskeletal:        General: No swelling.     Cervical back: Full passive range of motion without pain, normal range of motion and neck supple. No pain with movement, spinous process  tenderness or muscular tenderness. Normal range of motion.     Right lower leg: No edema.     Left lower leg: No edema.  Skin:    General: Skin is warm and dry.     Capillary Refill: Capillary refill takes less than 2 seconds.     Findings: No ecchymosis, erythema, lesion or wound.  Neurological:     Mental Status: He is alert and oriented to person, place, and time.     GCS: GCS eye subscore is 4. GCS verbal subscore is 5. GCS motor subscore is 6.     Cranial Nerves: Cranial nerves 2-12 are intact.     Sensory: Sensation is intact.     Motor: Motor function is intact. No weakness or abnormal muscle tone.     Coordination: Coordination is intact.  Psychiatric:        Mood and Affect: Mood normal.        Speech: Speech normal.        Behavior: Behavior normal.     (all labs  ordered are listed, but only abnormal results are displayed) Labs Reviewed  COMPREHENSIVE METABOLIC PANEL WITH GFR - Abnormal; Notable for the following components:      Result Value   Glucose, Bld 214 (*)    BUN 28 (*)    Creatinine, Ser 1.55 (*)    Alkaline Phosphatase 34 (*)    GFR, Estimated 48 (*)    All other components within normal limits  CBC WITH DIFFERENTIAL/PLATELET  TROPONIN T, HIGH SENSITIVITY    EKG: EKG Interpretation Date/Time:  Wednesday May 17 2024 22:40:25 EDT Ventricular Rate:  60 PR Interval:  171 QRS Duration:  101 QT Interval:  440 QTC Calculation: 440 R Axis:   35  Text Interpretation: Sinus rhythm Abnormal R-wave progression, early transition Inferior infarct, old Confirmed by Haze Lonni PARAS [phone]) on 05/17/2024 10:57:21 PM  Radiology: DG Chest 2 View Result Date: 05/17/2024 EXAM: 2 VIEW(S) XRAY OF THE CHEST 05/17/2024 10:56:16 PM COMPARISON: None available. CLINICAL HISTORY: cp. 68 y/o male reports CP, dizziness and SHOB x several weeks. FINDINGS: LUNGS AND PLEURA: No focal pulmonary opacity. No pulmonary edema. No pleural effusion. No pneumothorax. HEART AND MEDIASTINUM: Atherosclerotic plaque. BONES AND SOFT TISSUES: No acute osseous abnormality. IMPRESSION: 1. No acute cardiopulmonary process detected. Electronically signed by: Morgane Naveau MD 05/17/2024 10:57 PM EDT RP Workstation: [id number]     Procedures   Medications Ordered in the ED  aspirin chewable tablet 324 mg (has no administration in time range)                HEART Score: 7                    Medical Decision Making Amount and/or Complexity of Data Reviewed Labs: ordered. Decision-making details documented in ED Course. Radiology: ordered and independent interpretation performed. Decision-making details documented in ED Course. ECG/medicine tests: ordered and independent interpretation performed. Decision-making details documented in ED Course.   Differential  Diagnosis considered includes, but not limited to: STEMI; NSTEMI; myocarditis; pericarditis; pulmonary embolism; aortic dissection; pneumothorax; pneumonia; gastritis; musculoskeletal pain  Presents to the emergency department for evaluation of chest pain.  Patient reports that his symptoms are exertional and have been occurring intermittently for several weeks.  Every time he exerts himself he gets chest pain radiating to the back with shortness of breath.  Symptoms resolve when he sits down and rests.  This is concerning for cardiac etiology.  Additionally  he has multiple cardiac risk factors including hypertension, high cholesterol, hyperlipidemia and diabetes.  He reports a very strong family history of heart problems, mother had early heart problems but he does not think she had a heart attack.  EKG performed at arrival reveals inferior and lateral T wave flattening and inversions not seen on prior EKG, however we do not have an EKG more recent than 2014.  He is not experiencing symptoms at time of EKG.  Patient's first troponin is 18.  He remains comfortable.  Heart score is 7.  Will warrant hospitalization for further workup.       Final diagnoses:  Chest pain, unspecified type    ED Discharge Orders     None          Haze Lonni PARAS, MD 05/17/24 2345

## 2024-05-17 NOTE — ED Triage Notes (Signed)
 Pt reports CP, dizziness and SHOB x several weeks Worse tonight after watering flowers

## 2024-05-18 DIAGNOSIS — E119 Type 2 diabetes mellitus without complications: Secondary | ICD-10-CM | POA: Diagnosis not present

## 2024-05-18 DIAGNOSIS — Z951 Presence of aortocoronary bypass graft: Secondary | ICD-10-CM | POA: Diagnosis present

## 2024-05-18 DIAGNOSIS — I10 Essential (primary) hypertension: Secondary | ICD-10-CM

## 2024-05-18 DIAGNOSIS — E785 Hyperlipidemia, unspecified: Secondary | ICD-10-CM | POA: Diagnosis not present

## 2024-05-18 DIAGNOSIS — N179 Acute kidney failure, unspecified: Secondary | ICD-10-CM | POA: Diagnosis present

## 2024-05-18 DIAGNOSIS — M1990 Unspecified osteoarthritis, unspecified site: Secondary | ICD-10-CM | POA: Diagnosis present

## 2024-05-18 DIAGNOSIS — N189 Chronic kidney disease, unspecified: Secondary | ICD-10-CM

## 2024-05-18 DIAGNOSIS — R079 Chest pain, unspecified: Secondary | ICD-10-CM

## 2024-05-18 DIAGNOSIS — R001 Bradycardia, unspecified: Secondary | ICD-10-CM | POA: Diagnosis not present

## 2024-05-18 DIAGNOSIS — R42 Dizziness and giddiness: Secondary | ICD-10-CM | POA: Diagnosis not present

## 2024-05-18 DIAGNOSIS — Z743 Need for continuous supervision: Secondary | ICD-10-CM | POA: Diagnosis not present

## 2024-05-18 DIAGNOSIS — F17290 Nicotine dependence, other tobacco product, uncomplicated: Secondary | ICD-10-CM | POA: Diagnosis present

## 2024-05-18 DIAGNOSIS — I2089 Other forms of angina pectoris: Secondary | ICD-10-CM | POA: Diagnosis not present

## 2024-05-18 LAB — CBC WITH DIFFERENTIAL/PLATELET
Abs Immature Granulocytes: 0.01 K/uL (ref 0.00–0.07)
Basophils Absolute: 0.1 K/uL (ref 0.0–0.1)
Basophils Relative: 1 %
Eosinophils Absolute: 0.1 K/uL (ref 0.0–0.5)
Eosinophils Relative: 3 %
HCT: 44.1 % (ref 39.0–52.0)
Hemoglobin: 15.1 g/dL (ref 13.0–17.0)
Immature Granulocytes: 0 %
Lymphocytes Relative: 22 %
Lymphs Abs: 1.1 K/uL (ref 0.7–4.0)
MCH: 31.7 pg (ref 26.0–34.0)
MCHC: 34.2 g/dL (ref 30.0–36.0)
MCV: 92.6 fL (ref 80.0–100.0)
Monocytes Absolute: 0.3 K/uL (ref 0.1–1.0)
Monocytes Relative: 6 %
Neutro Abs: 3.5 K/uL (ref 1.7–7.7)
Neutrophils Relative %: 68 %
Platelets: 198 K/uL (ref 150–400)
RBC: 4.76 MIL/uL (ref 4.22–5.81)
RDW: 12.2 % (ref 11.5–15.5)
WBC: 5.1 K/uL (ref 4.0–10.5)
nRBC: 0 % (ref 0.0–0.2)

## 2024-05-18 LAB — TROPONIN T, HIGH SENSITIVITY: Troponin T High Sensitivity: 19 ng/L (ref 0–19)

## 2024-05-18 LAB — BASIC METABOLIC PANEL WITH GFR
Anion gap: 8 (ref 5–15)
BUN: 19 mg/dL (ref 8–23)
CO2: 26 mmol/L (ref 22–32)
Calcium: 8.9 mg/dL (ref 8.9–10.3)
Chloride: 107 mmol/L (ref 98–111)
Creatinine, Ser: 1.15 mg/dL (ref 0.61–1.24)
GFR, Estimated: >60 mL/min (ref >60)
Glucose, Bld: 91 mg/dL (ref 70–99)
Potassium: 3.8 mmol/L (ref 3.5–5.1)
Sodium: 141 mmol/L (ref 135–145)

## 2024-05-18 LAB — D-DIMER, QUANTITATIVE: D-Dimer, Quant: <0.27 ug{FEU}/mL (ref 0.00–0.50)

## 2024-05-18 LAB — HIV ANTIBODY (ROUTINE TESTING W REFLEX): HIV Screen 4th Generation wRfx: NONREACTIVE

## 2024-05-18 LAB — TSH: TSH: 1.623 u[IU]/mL (ref 0.350–4.500)

## 2024-05-18 MED ORDER — SODIUM CHLORIDE 0.9% FLUSH
3.0000 mL | Freq: Two times a day (BID) | INTRAVENOUS | Status: DC
  Administered 2024-05-18 – 2024-05-19 (×2): 3 mL via INTRAVENOUS

## 2024-05-18 MED ORDER — LISINOPRIL 5 MG PO TABS
2.5000 mg | ORAL_TABLET | Freq: Every day | ORAL | Status: DC
  Administered 2024-05-18 – 2024-05-21 (×4): 2.5 mg via ORAL
  Filled 2024-05-18 (×5): qty 1

## 2024-05-18 MED ORDER — ENOXAPARIN SODIUM 40 MG/0.4ML IJ SOSY
40.0000 mg | PREFILLED_SYRINGE | INTRAMUSCULAR | Status: DC
  Administered 2024-05-18: 40 mg via SUBCUTANEOUS
  Filled 2024-05-18: qty 0.4

## 2024-05-18 MED ORDER — ROSUVASTATIN CALCIUM 20 MG PO TABS
40.0000 mg | ORAL_TABLET | Freq: Every day | ORAL | Status: DC
  Administered 2024-05-18 – 2024-05-28 (×10): 40 mg via ORAL
  Filled 2024-05-18 (×10): qty 2

## 2024-05-18 MED ORDER — METFORMIN HCL ER 750 MG PO TB24
750.0000 mg | ORAL_TABLET | Freq: Every day | ORAL | Status: DC
Start: 2024-05-19 — End: 2024-05-18
  Filled 2024-05-18: qty 1

## 2024-05-18 MED ORDER — FENOFIBRATE 160 MG PO TABS
160.0000 mg | ORAL_TABLET | Freq: Every day | ORAL | Status: DC
  Administered 2024-05-19 – 2024-05-22 (×4): 160 mg via ORAL
  Filled 2024-05-18 (×7): qty 1

## 2024-05-18 MED ORDER — TRAZODONE HCL 50 MG PO TABS
100.0000 mg | ORAL_TABLET | Freq: Every day | ORAL | Status: DC
  Administered 2024-05-18 – 2024-05-21 (×4): 100 mg via ORAL
  Filled 2024-05-18 (×5): qty 1

## 2024-05-18 MED ORDER — ASPIRIN 81 MG PO TBEC
81.0000 mg | DELAYED_RELEASE_TABLET | Freq: Every day | ORAL | Status: DC
Start: 2024-05-18 — End: 2024-05-23
  Administered 2024-05-18 – 2024-05-22 (×5): 81 mg via ORAL
  Filled 2024-05-18 (×5): qty 1

## 2024-05-18 MED ORDER — ACETAMINOPHEN 650 MG RE SUPP: 650.0000 mg | Freq: Four times a day (QID) | RECTAL | Status: DC | PRN

## 2024-05-18 MED ORDER — ACETAMINOPHEN 325 MG PO TABS: 650.0000 mg | ORAL_TABLET | Freq: Four times a day (QID) | ORAL | Status: DC | PRN

## 2024-05-18 MED ORDER — LISINOPRIL 2.5 MG PO TABS: 2.5000 mg | ORAL_TABLET | Freq: Every day | ORAL | Status: DC

## 2024-05-18 MED ORDER — ALBUTEROL SULFATE (2.5 MG/3ML) 0.083% IN NEBU: 2.5000 mg | INHALATION_SOLUTION | Freq: Four times a day (QID) | RESPIRATORY_TRACT | Status: DC | PRN

## 2024-05-18 NOTE — ED Notes (Signed)
 Spoke with

## 2024-05-18 NOTE — ED Notes (Signed)
 Spoke with [facility], possibly 45 mins before bed gets assigned. EDP aware and wants to proceed with ED to ED transfer

## 2024-05-18 NOTE — ED Notes (Signed)
 Dr Dean wants pt seen by Cardiologist today therefore will continue with ED to ED transfer. AC notified . Pt has an assigned bed but is dirty

## 2024-05-18 NOTE — ED Notes (Signed)
 EDP made aware of pt. Bradycardia. No orders at this time. Will continue to monitor pt.

## 2024-05-18 NOTE — Consult Note (Addendum)
  Cardiology Consultation:  Patient ID: Matthew Patterson MRN: [id number]; DOB: [birth year] Admit date: 05/17/2024 Date of Consult: 05/18/2024 Primary Care Provider: Merna Huxley, NP Primary Cardiologist: None  Primary Electrophysiologist:  None  Patient Profile:  Matthew Patterson is a 68 y.o. male with a hx of HTN, HLD, DM who is being seen today for the evaluation of CP at the request of Dr. Claudene.  History of Present Illness:  Matthew Patterson presents with CP. A few weeks ago, he started having CP with exertion. The pain typically starts in back and then moves to the center of his chest and resolves with rest. The pain is 10/10 and associated with dyspnea. He had an episode yesterday while gardening and the wife suggested that he presents to ED. He denies any symptoms at rest and denies orthopnea, PND or LE edema.   Past Medical History: HTN, HLD, DM  Past Surgical History: Noncontributory  Allergies:    No Known Allergies  Social History: Smokes cigars  Family History:   Grandfather on Mother side with MI in his 13s  ROS:  All other ROS reviewed and negative. Pertinent positives noted in the HPI.     Physical Exam/Data:   Vitals:   05/18/24 1430 05/18/24 1445 05/18/24 1500 05/18/24 1536  BP: 129/80 127/83 116/69 128/79  Pulse: (!) 53 (!) 50 (!) 45 (!) 57  Resp: 17 19 13 16   Temp:    97.7 F (36.5 C)  TempSrc:    Oral  SpO2: 100% 99% 100% 98%  Weight:      Height:       No intake or output data in the 24 hours ending 05/18/24 1655     05/17/2024   10:39 PM 05/05/2024    7:04 AM 12/02/2023    7:55 AM  Last 3 Weights  Weight (lbs) 177 lb 179 lb 177 lb 6.4 oz  Weight (kg) 80.287 kg 81.194 kg 80.468 kg    Body mass index is 26.91 kg/m.  General: Well nourished, well developed, in no acute distress HEENT: Atraumatic, no JVD Cardiac: Normal S1, S2; RRR; no murmurs, rubs, or gallops Lungs:CTAB, no wheezing, rhonchi or rales  Abd: Soft, nontender, no hepatomegaly  Ext: No  edema, pulses 2+ Skin: Warm and dry, no rashes     Relevant CV Studies: Trop/BNP: 18, 19 Assessment and Plan:  Matthew Patterson is a 68 y.o. male with a hx of HTN, HLD, DM who is being seen today for the evaluation of CP at the request of Dr. Claudene.  #CP c/f stable angina #Family Hx of early ASCVD #HTN/HLD - Presenting with CP and dyspnea w/ exertion consistent with stable angina. He has strong family Hx of ASCVD. Trop negative but ECG with evidence of old infarct. - Cannot use beta blockers due to HR in 50s; will start amlodipine 10 mg daily and aspirin 81 mg daily - Will obtain TTE and coronary CTA - Cont rosuva 40 and lisinopril  2.5 - Send for Lpa   For questions or updates, please contact [facility] Please consult [email or web address] for contact info under   Signed, Joelle DEL. Ren Ny, MD, [facility]  [facility]  05/18/2024 4:55 PM

## 2024-05-18 NOTE — ED Notes (Signed)
Report given to [facility] 

## 2024-05-18 NOTE — H&P (Signed)
 History and Physical    Patient: Matthew Patterson FMW:[id number] DOB: 08/07/1955 DOA: 05/17/2024 DOS: the patient was seen and examined on 05/18/2024 PCP: Merna Huxley, NP  Patient coming from: transfer from   Chief Complaint:  Chief Complaint  Patient presents with   Shortness of Breath   Chest Pain   HPI: Matthew Patterson is a 68 y.o. male with medical history significant of hyperlipidemia, diabetes mellitus type 2, history of melanoma, anxiety, depression, and GERD presents with chest and back pain, shortness of breath, and dizziness. He is accompanied by his wife.  He has been experiencing shortness of breath, pain in his back and chest, and dizziness for the past few weeks. The pain originates in the back and seems to go to the the middle of the chest. These symptoms occur with physical exertion, such as when he was planting flowers and watering, and they resolve with rest after a few minutes. No specific movements reproduce the pain.  He has a history of a heart murmur diagnosed a long time ago and had a normal stress test in 2014, although he had difficulty achieving the target heart rate. No recent illness or feeling under the weather.  He smokes five to six cigars a week and has not consumed alcohol in the past three weeks, although he usually drinks a little beer. He traveled to [place]  a couple of weeks ago, with the last leg of the trip lasting four to five hours. He experienced a calf cramp about a week ago that lasted four days.  His family history includes a sister with atrial fibrillation and a mother who had heart disease later in life, including a blockage.  He does report that his maternal grandfather died from widow maker in his 41s and paternal grandmother as well as 2 first cousins have significant heart disease as well.  He reports prior history of chest pain back in 2014 for which she was evaluated and underwent a Myoview stress test which was noted to be normal heart  function at that time was noted to be within normal limits.  He frequently takes ibuprofen, typically two 200 mg tablets daily, due to his active lifestyle and history of  shoulder replacements. No black or dark stools.  Upon admission into the emergency department patient was noted to be afebrile with pulse 46-66, respirations 10-22, blood pressures elevated up to 154/88, and O2 saturations maintained on room air.  Labs obtained from 10/15 significant for high-sensitivity troponins negative x 2, BUN 28, creatinine 1.55, glucose 214.  Chest x-ray noted no acute cardiopulmonary process.  Review of Systems: As mentioned in the history of present illness. All other systems reviewed and are negative. Past Medical History:  Diagnosis Date   Anxiety and depression    Diabetes mellitus without complication (HCC)    GERD (gastroesophageal reflux disease)    Hyperlipidemia    Melanoma (HCC) 2023   back   Past Surgical History:  Procedure Laterality Date   ROTATOR CUFF REPAIR     SHOULDER SURGERY  2010   B/L   Social History:  reports that he has been smoking cigarettes and cigars. He started smoking about 15 years ago. He has never used smokeless tobacco. He reports current alcohol use of about 1.0 - 2.0 standard drink of alcohol per week. He reports that he does not use drugs.  No Known Allergies  Family History  Problem Relation Age of Onset   Diabetes Mother    Hypertension Mother  Cancer Sister        uterine cancer    Prior to Admission medications   Medication Sig Start Date End Date Taking? Authorizing Provider  Blood Glucose Monitoring Suppl (ONETOUCH VERIO IQ SYSTEM) w/Device KIT 1 kit 12/29/16   Nafziger, Darleene, NP  Continuous Glucose Sensor (FREESTYLE LIBRE 3 PLUS SENSOR) MISC 1 Device by Other route every 14 (fourteen) days. Change sensor every 15 days. 06/03/23   Shamleffer, Ibtehal Jaralla, MD  fenofibrate  (TRICOR ) 145 MG tablet TAKE 1 TABLET BY MOUTH DAILY 06/23/23    Nafziger, Darleene, NP  glucose blood test strip Use as instructed 12/29/16   Merna Darleene, NP  Lancets [facility] ULTRASOFT) lancets Use as instructed 12/29/16   Merna Darleene, NP  lisinopril  (ZESTRIL ) 2.5 MG tablet TAKE 1 TABLET BY MOUTH DAILY 03/22/24   Nafziger, Darleene, NP  metFORMIN  (GLUCOPHAGE -XR) 750 MG 24 hr tablet Take 1 tablet (750 mg total) by mouth daily with breakfast. 12/02/23   Shamleffer, Donell Cardinal, MD  rosuvastatin  (CRESTOR ) 40 MG tablet Take 1 tablet (40 mg total) by mouth daily. 12/06/23   Shamleffer, Donell Cardinal, MD  traZODone  (DESYREL ) 100 MG tablet TAKE 1 TABLET BY MOUTH AT BEDTIME 12/16/23   Merna Darleene, NP    Physical Exam: Vitals:   05/18/24 1100 05/18/24 1308 05/18/24 1309 05/18/24 1424  BP: 137/88   137/89  Pulse: 61   (!) 54  Resp: (!) 22   13  Temp:  98.4 F (36.9 C) 98.1 F (36.7 C) 98 F (36.7 C)  TempSrc:  Oral Oral Oral  SpO2: 98%   100%  Weight:      Height:        Constitutional: Early male who appears to be in no acute distress Eyes: PERRL, lids and conjunctivae normal ENMT: Mucous membranes are moist. Posterior pharynx clear of any exudate or lesions.Normal dentition.  Neck: normal, supple  Respiratory: clear to auscultation bilaterally, no wheezing, no crackles. Normal respiratory effort. No accessory muscle use.  Cardiovascular: Bradycardic with positive systolic murmur present 2/6.  2+ pedal pulses.  No lower extremity edema Abdomen: no tenderness, no masses palpated. No hepatosplenomegaly. Bowel sounds positive.  Musculoskeletal: no clubbing / cyanosis. No joint deformity upper and lower extremities. Good ROM, no contractures. Normal muscle tone.  Skin: no rashes, lesions, ulcers. No induration Neurologic: CN 2-12 grossly intact.  Strength 5/5 in all 4.  Psychiatric: Normal judgment and insight. Alert and oriented x 3. Normal mood.   Data Reviewed:   EKG reveals sinus rhythm at 60 bpm with early transition T wave inversions in V1 and  inferior leads not noted on prior EKG.  Reviewed labs, imaging, and pertinent records as documented.   Assessment and Plan:  Chest pain Patient presents with complaints of substernal chest pain with exertion and reports of dizziness.  High-sensitivity troponins negative x 2.  EKG noted T wave versions flattening noted on prior EKG.  Patient does smoke cigars.  Family history significant for maternal grandmother with widow maker MI in his 49s. - Admit to a cardiac telemetry bed - Check echocardiogram - Check D-dimer and TSH - Start aspirin and continue statin - Cardiology consulted we will follow-up for any further recommendation  Sinus bradycardia Patient noted to have heart rates in the 40s to 50s at baseline as he is very active. - Follow-up telemetry overnight  Controlled diabetes mellitus type 2, without long-term use of insulin Last hemoglobin A1c reported to be 6.2. - Hold metformin  for at least 48  hours as plans for IV contrast  Acute kidney injury on chronic kidney disease Resolved.  Creatinine initially noted to be elevated up to 1.55 with BUN 28.  Repeat check noted creatinine down to 1.15 with BUN 19. - Continue to monitor kidney function  Dyslipidemia Last LDL noted to be 71. - Continue Crestor  and fenofibrate   Arthritis - Advised patient on the risk of chronic NSAID use  Cigar smoker Patient reports smoking about 5 cigars/day on average. - Counseled patient on need of cessation of tobacco use    DVT prophylaxis: Lovenox Advance Care Planning:   Code Status: Full Code    Consults: Cardiology  Family Communication: Wife updated at bedside  Severity of Illness: The appropriate patient status for this patient is OBSERVATION. Observation status is judged to be reasonable and necessary in order to provide the required intensity of service to ensure the patient's safety. The patient's presenting symptoms, physical exam findings, and initial radiographic and  laboratory data in the context of their medical condition is felt to place them at decreased risk for further clinical deterioration. Furthermore, it is anticipated that the patient will be medically stable for discharge from the hospital within 2 midnights of admission.   Author: Maximino DELENA Sharps, MD 05/18/2024 2:36 PM  For on call review [email or web address].

## 2024-05-18 NOTE — ED Notes (Signed)
 [facility] called and will be sending truck for transport

## 2024-05-19 ENCOUNTER — Other Ambulatory Visit: Payer: Self-pay | Admitting: Cardiovascular Disease

## 2024-05-19 ENCOUNTER — Observation Stay (HOSPITAL_COMMUNITY): Admit: 2024-05-19 | Discharge: 2024-05-19 | Disposition: A | Attending: Cardiovascular Disease

## 2024-05-19 ENCOUNTER — Encounter (HOSPITAL_COMMUNITY)
Admission: EM | Disposition: A | Discharge status: Skilled Nursing Facility | Payer: Self-pay | Source: Home / Self Care | Attending: Thoracic Surgery (Cardiothoracic Vascular Surgery)

## 2024-05-19 ENCOUNTER — Observation Stay (HOSPITAL_COMMUNITY)

## 2024-05-19 DIAGNOSIS — I10 Essential (primary) hypertension: Secondary | ICD-10-CM

## 2024-05-19 DIAGNOSIS — E785 Hyperlipidemia, unspecified: Secondary | ICD-10-CM | POA: Diagnosis not present

## 2024-05-19 DIAGNOSIS — G4700 Insomnia, unspecified: Secondary | ICD-10-CM

## 2024-05-19 DIAGNOSIS — R079 Chest pain, unspecified: Secondary | ICD-10-CM | POA: Diagnosis not present

## 2024-05-19 DIAGNOSIS — I2510 Atherosclerotic heart disease of native coronary artery without angina pectoris: Secondary | ICD-10-CM | POA: Diagnosis not present

## 2024-05-19 DIAGNOSIS — I2089 Other forms of angina pectoris: Secondary | ICD-10-CM | POA: Diagnosis not present

## 2024-05-19 DIAGNOSIS — I25118 Atherosclerotic heart disease of native coronary artery with other forms of angina pectoris: Secondary | ICD-10-CM

## 2024-05-19 DIAGNOSIS — E782 Mixed hyperlipidemia: Secondary | ICD-10-CM | POA: Diagnosis not present

## 2024-05-19 DIAGNOSIS — N179 Acute kidney failure, unspecified: Secondary | ICD-10-CM

## 2024-05-19 DIAGNOSIS — R931 Abnormal findings on diagnostic imaging of heart and coronary circulation: Secondary | ICD-10-CM

## 2024-05-19 DIAGNOSIS — M5030 Other cervical disc degeneration, unspecified cervical region: Secondary | ICD-10-CM

## 2024-05-19 DIAGNOSIS — E781 Pure hyperglyceridemia: Secondary | ICD-10-CM | POA: Diagnosis not present

## 2024-05-19 DIAGNOSIS — K2100 Gastro-esophageal reflux disease with esophagitis, without bleeding: Secondary | ICD-10-CM

## 2024-05-19 DIAGNOSIS — E1159 Type 2 diabetes mellitus with other circulatory complications: Secondary | ICD-10-CM | POA: Diagnosis not present

## 2024-05-19 HISTORY — PX: RIGHT/LEFT HEART CATH AND CORONARY ANGIOGRAPHY: CATH118266

## 2024-05-19 LAB — BASIC METABOLIC PANEL WITH GFR
Anion gap: 8 (ref 5–15)
BUN: 22 mg/dL (ref 8–23)
CO2: 22 mmol/L (ref 22–32)
Calcium: 8.7 mg/dL — ABNORMAL LOW (ref 8.9–10.3)
Chloride: 108 mmol/L (ref 98–111)
Creatinine, Ser: 1.4 mg/dL — ABNORMAL HIGH (ref 0.61–1.24)
GFR, Estimated: 55 mL/min — ABNORMAL LOW (ref >60)
Glucose, Bld: 107 mg/dL — ABNORMAL HIGH (ref 70–99)
Potassium: 3.8 mmol/L (ref 3.5–5.1)
Sodium: 138 mmol/L (ref 135–145)

## 2024-05-19 LAB — POCT I-STAT EG7
Acid-base deficit: 2 mmol/L (ref 0.0–2.0)
Acid-base deficit: 3 mmol/L — ABNORMAL HIGH (ref 0.0–2.0)
Bicarbonate: 23.2 mmol/L (ref 20.0–28.0)
Bicarbonate: 23.4 mmol/L (ref 20.0–28.0)
Calcium, Ion: 1.17 mmol/L (ref 1.15–1.40)
Calcium, Ion: 1.24 mmol/L (ref 1.15–1.40)
HCT: 42 % (ref 39.0–52.0)
HCT: 43 % (ref 39.0–52.0)
Hemoglobin: 14.3 g/dL (ref 13.0–17.0)
Hemoglobin: 14.6 g/dL (ref 13.0–17.0)
O2 Saturation: 69 %
O2 Saturation: 70 %
Potassium: 3.5 mmol/L (ref 3.5–5.1)
Potassium: 3.7 mmol/L (ref 3.5–5.1)
Sodium: 140 mmol/L (ref 135–145)
Sodium: 140 mmol/L (ref 135–145)
TCO2: 24 mmol/L (ref 22–32)
TCO2: 25 mmol/L (ref 22–32)
pCO2, Ven: 43.1 mmHg — ABNORMAL LOW (ref 44–60)
pCO2, Ven: 43.3 mmHg — ABNORMAL LOW (ref 44–60)
pH, Ven: 7.339 (ref 7.25–7.43)
pH, Ven: 7.341 (ref 7.25–7.43)
pO2, Ven: 38 mmHg (ref 32–45)
pO2, Ven: 39 mmHg (ref 32–45)

## 2024-05-19 LAB — ECHOCARDIOGRAM COMPLETE
Area-P 1/2: 3.01 cm2
Height: 68 in
S' Lateral: 2.6 cm
Weight: 2832 [oz_av]

## 2024-05-19 LAB — CBC
HCT: 41.3 % (ref 39.0–52.0)
Hemoglobin: 14.3 g/dL (ref 13.0–17.0)
MCH: 32.1 pg (ref 26.0–34.0)
MCHC: 34.6 g/dL (ref 30.0–36.0)
MCV: 92.8 fL (ref 80.0–100.0)
Platelets: 175 K/uL (ref 150–400)
RBC: 4.45 MIL/uL (ref 4.22–5.81)
RDW: 12.3 % (ref 11.5–15.5)
WBC: 5.7 K/uL (ref 4.0–10.5)
nRBC: 0 % (ref 0.0–0.2)

## 2024-05-19 LAB — POCT I-STAT 7, (LYTES, BLD GAS, ICA,H+H)
Acid-base deficit: 4 mmol/L — ABNORMAL HIGH (ref 0.0–2.0)
Bicarbonate: 20.8 mmol/L (ref 20.0–28.0)
Calcium, Ion: 1.12 mmol/L — ABNORMAL LOW (ref 1.15–1.40)
HCT: 42 % (ref 39.0–52.0)
Hemoglobin: 14.3 g/dL (ref 13.0–17.0)
O2 Saturation: 95 %
Potassium: 3.3 mmol/L — ABNORMAL LOW (ref 3.5–5.1)
Sodium: 142 mmol/L (ref 135–145)
TCO2: 22 mmol/L (ref 22–32)
pCO2 arterial: 37 mmHg (ref 32–48)
pH, Arterial: 7.359 (ref 7.35–7.45)
pO2, Arterial: 77 mmHg — ABNORMAL LOW (ref 83–108)

## 2024-05-19 SURGERY — RIGHT/LEFT HEART CATH AND CORONARY ANGIOGRAPHY
Anesthesia: LOCAL

## 2024-05-19 MED ORDER — HEPARIN (PORCINE) 25000 UT/250ML-% IV SOLN
1400.0000 [IU]/h | INTRAVENOUS | Status: DC
  Administered 2024-05-19: 1200 [IU]/h via INTRAVENOUS
  Administered 2024-05-20: 1450 [IU]/h via INTRAVENOUS
  Administered 2024-05-21 – 2024-05-23 (×3): 1400 [IU]/h via INTRAVENOUS
  Filled 2024-05-19 (×6): qty 250

## 2024-05-19 MED ORDER — HEPARIN SODIUM (PORCINE) 1000 UNIT/ML IJ SOLN
INTRAMUSCULAR | Status: AC
  Filled 2024-05-19: qty 10

## 2024-05-19 MED ORDER — PERFLUTREN LIPID MICROSPHERE
1.0000 mL | INTRAVENOUS | Status: AC | PRN
  Administered 2024-05-19: 2 mL via INTRAVENOUS

## 2024-05-19 MED ORDER — FENTANYL CITRATE (PF) 100 MCG/2ML IJ SOLN
INTRAMUSCULAR | Status: AC
  Filled 2024-05-19: qty 2

## 2024-05-19 MED ORDER — IOHEXOL 350 MG/ML SOLN
INTRAVENOUS | Status: DC | PRN
  Administered 2024-05-19: 60 mL

## 2024-05-19 MED ORDER — FREE WATER
500.0000 mL | Freq: Once | Status: AC
  Administered 2024-05-19: 500 mL via ORAL

## 2024-05-19 MED ORDER — MIDAZOLAM HCL 2 MG/2ML IJ SOLN
INTRAMUSCULAR | Status: AC
  Filled 2024-05-19: qty 2

## 2024-05-19 MED ORDER — VERAPAMIL HCL 2.5 MG/ML IV SOLN
INTRAVENOUS | Status: DC | PRN
  Administered 2024-05-19: 10 mL via INTRA_ARTERIAL

## 2024-05-19 MED ORDER — SODIUM CHLORIDE 0.9% FLUSH
3.0000 mL | INTRAVENOUS | Status: DC | PRN
Start: 2024-05-19 — End: 2024-05-21

## 2024-05-19 MED ORDER — ONDANSETRON HCL 4 MG/2ML IJ SOLN: 4.0000 mg | Freq: Four times a day (QID) | INTRAMUSCULAR | Status: DC | PRN

## 2024-05-19 MED ORDER — NITROGLYCERIN 0.4 MG SL SUBL
SUBLINGUAL_TABLET | SUBLINGUAL | Status: AC
  Filled 2024-05-19: qty 2

## 2024-05-19 MED ORDER — IOHEXOL 350 MG/ML SOLN
100.0000 mL | Freq: Once | INTRAVENOUS | Status: AC | PRN
  Administered 2024-05-19: 100 mL via INTRAVENOUS

## 2024-05-19 MED ORDER — ASPIRIN 81 MG PO CHEW: 81.0000 mg | CHEWABLE_TABLET | ORAL | Status: DC

## 2024-05-19 MED ORDER — HEPARIN (PORCINE) IN NACL 1000-0.9 UT/500ML-% IV SOLN
INTRAVENOUS | Status: DC | PRN
  Administered 2024-05-19 (×2): 500 mL

## 2024-05-19 MED ORDER — FENTANYL CITRATE (PF) 100 MCG/2ML IJ SOLN
INTRAMUSCULAR | Status: DC | PRN
  Administered 2024-05-19: 25 ug via INTRAVENOUS

## 2024-05-19 MED ORDER — SODIUM CHLORIDE 0.9% FLUSH
3.0000 mL | Freq: Two times a day (BID) | INTRAVENOUS | Status: DC
  Administered 2024-05-19 – 2024-05-22 (×2): 3 mL via INTRAVENOUS

## 2024-05-19 MED ORDER — SODIUM CHLORIDE 0.9 % IV SOLN: 250.0000 mL | INTRAVENOUS | Status: AC | PRN

## 2024-05-19 MED ORDER — AMLODIPINE BESYLATE 10 MG PO TABS
10.0000 mg | ORAL_TABLET | Freq: Every day | ORAL | Status: DC
  Administered 2024-05-19: 10 mg via ORAL
  Filled 2024-05-19 (×3): qty 1

## 2024-05-19 MED ORDER — LIDOCAINE HCL (PF) 1 % IJ SOLN
INTRAMUSCULAR | Status: AC
  Filled 2024-05-19: qty 30

## 2024-05-19 MED ORDER — HEPARIN SODIUM (PORCINE) 1000 UNIT/ML IJ SOLN
INTRAMUSCULAR | Status: DC | PRN
  Administered 2024-05-19: 4000 [IU] via INTRAVENOUS

## 2024-05-19 MED ORDER — MIDAZOLAM HCL (PF) 2 MG/2ML IJ SOLN
INTRAMUSCULAR | Status: DC | PRN
  Administered 2024-05-19: 2 mg via INTRAVENOUS

## 2024-05-19 MED ADMIN — Lidocaine HCl Local Preservative Free (PF) Inj 1%: 2 mL | NDC 00409427902

## 2024-05-19 MED FILL — Verapamil HCl IV Soln 2.5 MG/ML: INTRAVENOUS | Qty: 2 | Status: AC

## 2024-05-19 SURGICAL SUPPLY — 9 items
CATH 5FR JL3.5 JR4 ANG PIG MP (CATHETERS) IMPLANT
CATH BALLN WEDGE 5F 110CM (CATHETERS) IMPLANT
DEVICE RAD COMP TR BAND LRG (VASCULAR PRODUCTS) IMPLANT
GLIDESHEATH SLEND SS 6F .021 (SHEATH) IMPLANT
GUIDEWIRE INQWIRE 1.5J.035X260 (WIRE) IMPLANT
PACK CARDIAC CATHETERIZATION (CUSTOM PROCEDURE TRAY) ×1 IMPLANT
SET ATX-X65L (MISCELLANEOUS) IMPLANT
SHEATH GLIDE SLENDER 4/5FR (SHEATH) IMPLANT
SHEATH PROBE COVER 6X72 (BAG) IMPLANT

## 2024-05-19 NOTE — Consult Note (Incomplete)
 [address]       [place] [address]             [phone]        [facility] Medical Record #[id number] Date of Birth: 01/31/1956  Referring: No ref. provider found Primary Care: Merna Huxley, NP Primary Cardiologist:None  Chief Complaint:    Chief Complaint  Patient presents with   Shortness of Breath   Chest Pain    History of Present Illness:    We have asked to see this 68 year old male in cardiothoracic surgical surgical consultation for consideration of possible coronary artery surgical revascularization.  The patient presented to the emergency department yesterday with complaints of both shortness of breath and chest pain.  He has a significant cardiac risk profile with multiple comorbidities.  There was also some pain that did not radiate to his back as well as some dizziness.  This has been occurring for the past few weeks and are primarily noted to be with physical exertion.  Symptoms typically resolve after a few minutes of rest.  Cardiac history includes a remote diagnosis of heart murmur as well as a normal stress test in 2014 although notable he did not achieve the target heart rate.  He smokes occasional cigars and uses a occasional alcohol but none recently.  He does have a history of several family members who had significant heart disease.  His Myoview in 2014 showed normal heart function.  He was admitted to the hospital for further evaluation treatment cardiology consultation.  A chest CT with coronary morphology did reveal findings of severe RCA and significant left main disease.  There is also at least moderate disease in the LAD and circumflex systems.  He was felt to require cardiac catheterization which was also performed on today's date and results showed    Current Activity/ Functional Status: {functional status:19517}   Zubrod Score: At the time of surgery this patient's most appropriate activity status/level should  be described as: []     0    Normal activity, no symptoms []     1    Restricted in physical strenuous activity but ambulatory, able to do out light work []     2    Ambulatory and capable of self care, unable to do work activities, up and about                 more than 50%  Of the time                            []     3    Only limited self care, in bed greater than 50% of waking hours []     4    Completely disabled, no self care, confined to bed or chair []     5    Moribund  Past Medical History:  Diagnosis Date   Anxiety and depression    Diabetes mellitus without complication (HCC)    GERD (gastroesophageal reflux disease)    Hyperlipidemia    Melanoma (HCC) 2023   back    Past Surgical History:  Procedure Laterality Date   ROTATOR CUFF REPAIR     SHOULDER SURGERY  2010   B/L    Social History   Tobacco Use  Smoking Status Some Days   Types: Cigarettes, Cigars   Start date: 2010  Smokeless Tobacco Never    Social History  Substance and Sexual Activity  Alcohol Use Yes   Alcohol/week: 1.0 - 2.0 standard drink of alcohol   Types: 1 - 2 Cans of beer per week     No Known Allergies  Current Facility-Administered Medications  Medication Dose Route Frequency Provider Last Rate Last Admin   acetaminophen  (TYLENOL ) tablet 650 mg  650 mg Oral Q6H PRN Smith, Rondell A, MD       Or   acetaminophen  (TYLENOL ) suppository 650 mg  650 mg Rectal Q6H PRN Smith, Rondell A, MD       albuterol (PROVENTIL) (2.5 MG/3ML) 0.083% nebulizer solution 2.5 mg  2.5 mg Nebulization Q6H PRN Claudene, Rondell A, MD       amLODipine (NORVASC) tablet 10 mg  10 mg Oral Daily Azobou Tonleu, Franck H, MD   10 mg at 05/19/24 1021   aspirin EC tablet 81 mg  81 mg Oral Daily Azobou Tonleu, Franck H, MD   81 mg at 05/19/24 1021   enoxaparin (LOVENOX) injection 40 mg  40 mg Subcutaneous Q24H Smith, Rondell A, MD   40 mg at 05/18/24 1727   fenofibrate  tablet 160 mg  160 mg Oral Daily Pollina, Lonni PARAS,  MD       free water 500 mL  500 mL Oral Once Johnson, Kathleen R, PA-C       lisinopril  (ZESTRIL ) tablet 2.5 mg  2.5 mg Oral Daily Shona Laurence N, DO   2.5 mg at 05/19/24 1020   rosuvastatin  (CRESTOR ) tablet 40 mg  40 mg Oral Daily Smith, Rondell A, MD   40 mg at 05/19/24 1021   sodium chloride  flush (NS) 0.9 % injection 3 mL  3 mL Intravenous Q12H Smith, Rondell A, MD   3 mL at 05/19/24 1022   traZODone  (DESYREL ) tablet 100 mg  100 mg Oral QHS Smith, Rondell A, MD   100 mg at 05/18/24 2125    Medications Prior to Admission  Medication Sig Dispense Refill Last Dose/Taking   fenofibrate  (TRICOR ) 145 MG tablet TAKE 1 TABLET BY MOUTH DAILY 90 tablet 3 05/16/2024   lisinopril  (ZESTRIL ) 2.5 MG tablet TAKE 1 TABLET BY MOUTH DAILY 90 tablet 0 05/16/2024   metFORMIN  (GLUCOPHAGE -XR) 750 MG 24 hr tablet Take 1 tablet (750 mg total) by mouth daily with breakfast. 90 tablet 3 05/16/2024   rosuvastatin  (CRESTOR ) 40 MG tablet Take 1 tablet (40 mg total) by mouth daily. 90 tablet 3 05/16/2024   traZODone  (DESYREL ) 100 MG tablet TAKE 1 TABLET BY MOUTH AT BEDTIME 90 tablet 1 05/16/2024   Blood Glucose Monitoring Suppl (ONETOUCH VERIO IQ SYSTEM) w/Device KIT 1 kit 1 kit 0    Continuous Glucose Sensor (FREESTYLE LIBRE 3 PLUS SENSOR) MISC 1 Device by Other route every 14 (fourteen) days. Change sensor every 15 days. 6 each 3    glucose blood test strip Use as instructed 100 each 12    Lancets (ONETOUCH ULTRASOFT) lancets Use as instructed 100 each 12     Family History  Problem Relation Age of Onset   Diabetes Mother    Hypertension Mother    Cancer Sister        uterine cancer     Review of Systems:   ROS {Ros - complete:30496}     Cardiac Review of Systems: Y or  [    ]= no  Chest Pain [    ]  Resting SOB [   ] Exertional SOB  [  ]  Orthopnea [  ]  Pedal Edema [   ]    Palpitations [  ] Syncope  [  ]   Presyncope [   ]  General Review of Systems: [Y] = yes [  ]=no Constitional: recent weight  change [  ]; anorexia [  ]; fatigue [  ]; nausea [  ]; night sweats [  ]; fever [  ]; or chills [  ]                                                               Dental: Last Dentist visit:   Eye : blurred vision [  ]; diplopia [   ]; vision changes [  ];  Amaurosis fugax[  ]; Resp: cough [  ];  wheezing[  ];  hemoptysis[  ]; shortness of breath[  ]; paroxysmal nocturnal dyspnea[  ]; dyspnea on exertion[  ]; or orthopnea[  ];  GI:  gallstones[  ], vomiting[  ];  dysphagia[  ]; melena[  ];  hematochezia [  ]; heartburn[  ];   Hx of  Colonoscopy[  ]; GU: kidney stones [  ]; hematuria[  ];   dysuria [  ];  nocturia[  ];  history of     obstruction [  ]; urinary frequency [  ]             Skin: rash, swelling[  ];, hair loss[  ];  peripheral edema[  ];  or itching[  ]; Musculosketetal: myalgias[  ];  joint swelling[  ];  joint erythema[  ];  joint pain[  ];  back pain[  ];  Heme/Lymph: bruising[  ];  bleeding[  ];  anemia[  ];  Neuro: TIA[  ];  headaches[  ];  stroke[  ];  vertigo[  ];  seizures[  ];   paresthesias[  ];  difficulty walking[  ];  Psych:depression[  ]; anxiety[  ];  Endocrine: diabetes[  ];  thyroid  dysfunction[  ];            {Dental Care:21289::Single injection performed as below:}            {Flue/Pneumonia Vaccination Status:21291}      Physical Exam: BP 97/73 (BP Location: Left Arm)   Pulse (!) 53   Temp 97.9 F (36.6 C) (Oral)   Resp 17   Ht 5' 8 (1.727 m)   Wt 80.3 kg   SpO2 99%   BMI 26.91 kg/m    {physical exam:21449}  Diagnostic Studies & Laboratory data:     Recent Radiology Findings:   CT CORONARY FRACTIONAL FLOW RESERVE FLUID ANALYSIS Result Date: 05/19/2024 EXAM: FFRCT ANALYSIS for abnormal coronary CT-A. FINDINGS: FFRct analysis was performed on the original cardiac CT angiogram dataset. Diagrammatic representation of the FFRct analysis is provided in a separate PDF document in PACS. This dictation was created using the PDF document and an  interactive 3D model of the results. 3D model is not available in the EMR/PACS. Normal FFR range is >0.80. 1. Left Main: Very short.  FFRct 1.0. 2. LAD: FFRct 0.87 ostial, 0.81 proximal, 0.72 mid. Distal LAD is not modeled. 3. LCX: FFRct 0.99 ostial. 0.8 proximal, 0.77 mid, 0.71 distal. L-PDA modeled as occluded. OM1 and OM2 are not modeled. OM3 FFRct 0.78 proximal, 0.57 distal.  4.       RCA: Occlusion at the ostium IMPRESSION: 1. FFRct findings are consistent with 100% occlusion at the ostium of the RCA and at the L-PDA. 2. Findings are concerning for severe stenosis in the mid LAD, distal LCX and OM3. 3.  Recommend cardiac catheterization. 4. LM is very short. Recommend care in engaging the LM, as there is almost dual ostia for the LAD and CX and concern for significant ostial disease in both vessels. Tiffany C. Raford, MD Electronically Signed   By: Annabella Raford M.D.   On: 05/19/2024 12:51   CT CORONARY MORPH W/CTA COR W/SCORE W/CA W/CM &/OR WO/CM Result Date: 05/19/2024 CLINICAL DATA:  85M with hypertension, hyperlipidemia, diabetes and chest pain. EXAM: Cardiac/Coronary  CT TECHNIQUE: The patient was scanned on a [employer]. PROTOCOL: A 120 kV prospective scan was triggered in the descending thoracic aorta at 111 HU's. Axial non-contrast 3 mm slices were carried out through the heart. The data set was analyzed on a dedicated work station and scored using the Agatson method. Gantry rotation speed was 250 msecs and collimation was .6 mm. No beta blockade and 0.8 mg of sl NTG was given. The 3D data set was reconstructed in 5% intervals of the 67-82 % of the R-R cycle. Diastolic phases were analyzed on a dedicated work station using MPR, MIP and VRT modes. The patient received 80 cc of contrast. FINDINGS: Aorta: Normal size.  Aortic atherosclerosis.   No dissection. Aortic Valve:  Trileaflet.  No calcifications. Coronary Arteries:  Normal coronary origin.  Right dominance. RCA is a small  non-dominant artery that gives rise to PDA and PLVB. There is 100% occlusion at the ostium and severe (>70%) mixed plaque in the proximal RCA. Left main is a large artery that gives rise to LAD and LCX arteries. There is minimal (<25%) calcified plaque. LAD is a large vessel that has extensive mixed plaque. There is moderate (50-69%) mixed plaque in the ostium and severe (>70%) mixed plaque in the mid and distal LAD. D1 and D2 are small vessels that are heavily diseased. LCX is a dominant artery with moderate (50-69%) mixed plaque in the proximal and distal vessel. There is severe (>70%) stenosis in L-PDA. There is severe (>70%) stenosis in OM1. There is severe (>70%) stenosis at the ostium of OM2. Coronary Calcium  Score: Left main: 112 Left anterior descending artery: 1007 Left circumflex artery: 747 Right coronary artery: 77.9 Total: 1944 Percentile: 95th Other findings: Normal pulmonary vein drainage into the left atrium. Normal let atrial appendage without a thrombus. Normal size of the pulmonary artery. Non-cardiac: See separate report from [facility]. IMPRESSION: 1. Coronary calcium  score of 1944. This was 95th percentile for age-, sex, and race-matched controls. 2. Normal coronary origin with left dominance. 3. There is 100% occlusion at the ostium and severe (>70%) mixed plaque in the proximal RCA. There is severe (>70%) stenosis in the LAD, OM1, OM2 and L-PDA. There is moderate (50-69%) stenosis in the proximal and distal LAD. CAD-RADS 5. 4.  Willl send for FFRct. 5.  Recommend cardiac catheterization. RECOMMENDATIONS: CAD-RADS 5: Total coronary occlusion (100%). Consider cardiac catheterization or viability assessment. Consider symptom-guided anti-ischemic pharmacotherapy as well as risk factor modification per guideline directed care. Annabella Raford, MD Electronically Signed   By: Annabella Raford M.D.   On: 05/19/2024 12:42   ECHOCARDIOGRAM COMPLETE Result Date: 05/19/2024     ECHOCARDIOGRAM REPORT   Patient Name:   Matthew Patterson [facility] Date of Exam:  05/19/2024 Medical Rec #:  [id number]       Height:       68.0 in Accession #:    [id number]      Weight:       177.0 lb Date of Birth:  June 06, 1956       BSA:          1.940 m Patient Age:    68 years        BP:           117/79 mmHg Patient Gender: M               HR:           50 bpm. Exam Location:  Inpatient Procedure: 2D Echo, Cardiac Doppler and Color Doppler (Both Spectral and Color            Flow Doppler were utilized during procedure). Indications:    Chest Pain R07.9  History:        Patient has no prior history of Echocardiogram examinations.                 Previous Myocardial Infarction; Risk Factors:Hypertension,                 Diabetes and Dyslipidemia.  Sonographer:    Tinnie Gosling RDCS Referring Phys: [phone] RONDELL A SMITH IMPRESSIONS  1. Left ventricular ejection fraction, by estimation, is 60 to 65%. The left ventricle has normal function. The left ventricle has no regional wall motion abnormalities. Left ventricular diastolic parameters were normal.  2. Right ventricular systolic function is normal. The right ventricular size is normal. Tricuspid regurgitation signal is inadequate for assessing PA pressure.  3. Left atrial size was mildly dilated.  4. The mitral valve is normal in structure. No evidence of mitral valve regurgitation. No evidence of mitral stenosis.  5. The aortic valve is normal in structure. Aortic valve regurgitation is not visualized. No aortic stenosis is present.  6. The inferior vena cava is normal in size with greater than 50% respiratory variability, suggesting right atrial pressure of 3 mmHg.  7. Cannot exclude a small PFO. Comparison(s): No prior Echocardiogram. FINDINGS  Left Ventricle: Left ventricular ejection fraction, by estimation, is 60 to 65%. The left ventricle has normal function. The left ventricle has no regional wall motion abnormalities. The left ventricular internal cavity size was  normal in size. There is  no left ventricular hypertrophy. Left ventricular diastolic parameters were normal. Right Ventricle: The right ventricular size is normal. No increase in right ventricular wall thickness. Right ventricular systolic function is normal. Tricuspid regurgitation signal is inadequate for assessing PA pressure. Left Atrium: Left atrial size was mildly dilated. Right Atrium: Right atrial size was normal in size. Pericardium: There is no evidence of pericardial effusion. Presence of epicardial fat layer. Mitral Valve: The mitral valve is normal in structure. No evidence of mitral valve regurgitation. No evidence of mitral valve stenosis. Tricuspid Valve: The tricuspid valve is normal in structure. Tricuspid valve regurgitation is trivial. No evidence of tricuspid stenosis. Aortic Valve: The aortic valve is normal in structure. Aortic valve regurgitation is not visualized. No aortic stenosis is present. Pulmonic Valve: The pulmonic valve was normal in structure. Pulmonic valve regurgitation is not visualized. No evidence of pulmonic stenosis. Aorta: The aortic root and ascending aorta are structurally normal, with no evidence of dilitation. Venous: The inferior vena cava is normal in size with greater than 50% respiratory variability, suggesting right atrial pressure of 3  mmHg. IAS/Shunts: Cannot exclude a small PFO.  LEFT VENTRICLE PLAX 2D LVIDd:         4.40 cm   Diastology LVIDs:         2.60 cm   LV e' medial:    7.29 cm/s LV PW:         1.00 cm   LV E/e' medial:  10.5 LV IVS:        1.00 cm   LV e' lateral:   7.94 cm/s LVOT diam:     2.00 cm   LV E/e' lateral: 9.7 LV SV:         71 LV SV Index:   37 LVOT Area:     3.14 cm  RIGHT VENTRICLE             IVC RV S prime:     10.40 cm/s  IVC diam: 1.60 cm TAPSE (M-mode): 2.2 cm LEFT ATRIUM             Index        RIGHT ATRIUM           Index LA diam:        3.50 cm 1.80 cm/m   RA Area:     12.20 cm LA Vol (A2C):   56.3 ml 29.02 ml/m  RA Volume:    26.10 ml  13.45 ml/m LA Vol (A4C):   44.5 ml 22.93 ml/m LA Biplane Vol: 51.2 ml 26.39 ml/m  AORTIC VALVE LVOT Vmax:   96.00 cm/s LVOT Vmean:  66.500 cm/s LVOT VTI:    0.227 m  AORTA Ao Root diam: 2.90 cm Ao Asc diam:  3.30 cm MITRAL VALVE MV Area (PHT): 3.01 cm    SHUNTS MV Decel Time: 252 msec    Systemic VTI:  0.23 m MV E velocity: 76.70 cm/s  Systemic Diam: 2.00 cm MV A velocity: 83.60 cm/s MV E/A ratio:  0.92 Emeline Calender Electronically signed by Emeline Calender Signature Date/Time: 05/19/2024/11:46:14 AM    Final    DG Chest 2 View Result Date: 05/17/2024 EXAM: 2 VIEW(S) XRAY OF THE CHEST 05/17/2024 10:56:16 PM COMPARISON: None available. CLINICAL HISTORY: cp. 68 y/o male reports CP, dizziness and SHOB x several weeks. FINDINGS: LUNGS AND PLEURA: No focal pulmonary opacity. No pulmonary edema. No pleural effusion. No pneumothorax. HEART AND MEDIASTINUM: Atherosclerotic plaque. BONES AND SOFT TISSUES: No acute osseous abnormality. IMPRESSION: 1. No acute cardiopulmonary process detected. Electronically signed by: Kate Plummer MD 05/17/2024 10:57 PM EDT RP Workstation: [id number]     I have independently reviewed the above radiologic studies and discussed with the patient   Recent Lab Findings: Lab Results  Component Value Date   WBC 5.7 05/19/2024   HGB 14.3 05/19/2024   HCT 41.3 05/19/2024   PLT 175 05/19/2024   GLUCOSE 107 (H) 05/19/2024   CHOL 142 05/05/2024   TRIG 93.0 05/05/2024   HDL 52.50 05/05/2024   LDLDIRECT 156.0 04/08/2021   LDLCALC 71 05/05/2024   ALT 19 05/17/2024   AST 23 05/17/2024   NA 138 05/19/2024   K 3.8 05/19/2024   CL 108 05/19/2024   CREATININE 1.40 (H) 05/19/2024   BUN 22 05/19/2024   CO2 22 05/19/2024   TSH 1.623 05/18/2024   HGBA1C 6.2 (A) 12/02/2023      Assessment / Plan:          I  spent {CHL ONC TIME VISIT - DTPQU:[id number]} counseling the patient face to face.  Lavoris Canizales E Verina Galeno, PA-C  05/19/2024 1:37 PM

## 2024-05-19 NOTE — Progress Notes (Signed)
  Progress Note   Patient: Matthew Patterson FMW:[id number] DOB: 1956/02/03 DOA: 05/17/2024     0 DOS: the patient was seen and examined on 05/19/2024   Brief hospital course: Matthew Patterson is a 68 y.o. male with medical history significant of hyperlipidemia, diabetes mellitus type 2, history of melanoma, anxiety, depression, and GERD presents with chest and back pain, shortness of breath, and dizziness. He is accompanied by his wife.   He has been experiencing shortness of breath, pain in his back and chest, and dizziness for the past few weeks. The pain originates in the back and seems to go to the the middle of the chest. These symptoms occur with physical exertion, such as when he was planting flowers and watering, and they resolve with rest after a few minutes. No specific movements reproduce the pain.    Assessment and Plan:  Chest pain Cannot use beta-blocker due to bradycardia Continue amlodipine, aspirin, rosuvastatin , lisinopril  2.5 mg. Coronary CT angiogram showed multivessel coronary artery disease. Cardiology plan for [facility] and CT surgery consult.  Sinus bradycardia Patient noted to have heart rates in the 40s to 50s at baseline as he is very active. - Follow-up telemetry overnight   Controlled diabetes mellitus type 2, without long-term use of insulin Last hemoglobin A1c reported to be 6.2. - Hold metformin  for at least 48 hours as plans for IV contrast   Acute kidney injury on chronic kidney disease Resolved.  Creatinine initially noted to be elevated up to 1.55 with BUN 28.  Repeat check noted creatinine down to 1.15 with BUN 19. - Continue to monitor kidney function   Dyslipidemia Last LDL noted to be 71. - Continue Crestor  and fenofibrate    Arthritis - Advised patient on the risk of chronic NSAID use   Cigar smoker Patient reports smoking about 5 cigars/day on average. - Counseled patient on need of cessation of tobacco use        Subjective: No chest pain  today  Physical Exam: Vitals:   05/19/24 1608 05/19/24 1613 05/19/24 1631 05/19/24 1645  BP: 120/66 125/79 118/72   Pulse: 65 74 60 (!) 58  Resp: 14 13    Temp:   98.2 F (36.8 C)   TempSrc:   Oral   SpO2: 97% 98% 100% 100%  Weight:      Height:       Constitutional: Alert, awake, calm, comfortable HEENT: Neck supple Respiratory: clear to auscultation bilaterally, no wheezing, no crackles. Normal respiratory effort. No accessory muscle use.  Cardiovascular: Regular rate and rhythm, no murmurs / rubs / gallops. No extremity edema. 2+ pedal pulses. No carotid bruits.  Abdomen: no tenderness, no masses palpated. No hepatosplenomegaly. Bowel sounds positive.  Musculoskeletal: no clubbing / cyanosis. No joint deformity upper and lower extremities. Good ROM, no contractures. Normal muscle tone.  Skin: no rashes, lesions, ulcers. No induration Neurologic: CN 2-12 grossly intact. Sensation intact, DTR normal. Strength 5/5 x all 4 extremities.  Psychiatric: Normal judgment and insight. Alert and oriented x 3. Normal mood.   Data Reviewed:  Results reviewed  Family Communication: None  Disposition: Status is: Observation The patient remains OBS appropriate and will d/c before 2 midnights.  Planned Discharge Destination: Home    Time spent: 35 minutes  Author: Nena Rebel, MD 05/19/2024 5:00 PM  For on call review [email or web address].

## 2024-05-19 NOTE — Progress Notes (Signed)
  Progress Note  Patient Name: Matthew Patterson Date of Encounter: 05/19/2024 [facility] Cardiologist: None    Interval Summary    Feeling well this AM. No chest pain while at rest. No shortness of breath. Coronary CTA scheduled for today   Vital Signs Vitals:   05/18/24 2026 05/19/24 0120 05/19/24 0459 05/19/24 0825  BP: 122/78 109/68 103/72 117/79  Pulse: 60 (!) 51 (!) 56 65  Resp: 16 14 16 20   Temp: 98.1 F (36.7 C) 98 F (36.7 C) 98.2 F (36.8 C) 98 F (36.7 C)  TempSrc: Oral Oral Oral Oral  SpO2: 96% 97% 94% 96%  Weight:      Height:        Intake/Output Summary (Last 24 hours) at 05/19/2024 0903 Last data filed at 05/18/2024 2130 Gross per 24 hour  Intake 480 ml  Output --  Net 480 ml      05/17/2024   10:39 PM 05/05/2024    7:04 AM 12/02/2023    7:55 AM  Last 3 Weights  Weight (lbs) 177 lb 179 lb 177 lb 6.4 oz  Weight (kg) 80.287 kg 81.194 kg 80.468 kg      Telemetry/ECG  Sinus brady with HR in the 40s-50s - Personally Reviewed  Physical Exam  GEN: No acute distress.  Sitting comfortably in the bed Neck: No JVD Cardiac:  RRR, no murmurs, rubs, or gallops.  Respiratory: Clear to auscultation bilaterally. Normal WOB on room air  GI: Soft, nontender, non-distended  MS: No edema in BLE  Assessment & Plan   Stable Angina  - Patient presented with chest pain on exertion. Trop negative x2. EKG with evidence of old infarct  - Patient has multiple risk factors for CAD including family history of premature CAD, HTN, HLD  - Echocardiogram pending  - Coronary CTA ordered yesterday - I have messaged the cardiac imaging schedulers to arrange today  - Continue amlodipine 10 mg daily. Not candidate for BB due to bradycardia  - Continue ASA 81 mg daily  - Continue fenofibrate  160 mg daily, crestor  40 mg daily   HTN  - Continue amlodipine 10 mg daily and lisinopril  2.5 mg daily   HLD  - Continue fenofibrate  160 mg daily, crestor  40 mg daily  - LP(a)  pending   For questions or updates, please contact [facility] Please consult [email or web address] for contact info under    Signed, Rollo FABIENE Louder, PA-C

## 2024-05-19 NOTE — H&P (View-Only) (Signed)
  Progress Note  Patient Name: Matthew Patterson Date of Encounter: 05/19/2024 [facility] Cardiologist: None    Interval Summary    Feeling well this AM. No chest pain while at rest. No shortness of breath. Coronary CTA scheduled for today   Vital Signs Vitals:   05/18/24 2026 05/19/24 0120 05/19/24 0459 05/19/24 0825  BP: 122/78 109/68 103/72 117/79  Pulse: 60 (!) 51 (!) 56 65  Resp: 16 14 16 20   Temp: 98.1 F (36.7 C) 98 F (36.7 C) 98.2 F (36.8 C) 98 F (36.7 C)  TempSrc: Oral Oral Oral Oral  SpO2: 96% 97% 94% 96%  Weight:      Height:        Intake/Output Summary (Last 24 hours) at 05/19/2024 0903 Last data filed at 05/18/2024 2130 Gross per 24 hour  Intake 480 ml  Output --  Net 480 ml      05/17/2024   10:39 PM 05/05/2024    7:04 AM 12/02/2023    7:55 AM  Last 3 Weights  Weight (lbs) 177 lb 179 lb 177 lb 6.4 oz  Weight (kg) 80.287 kg 81.194 kg 80.468 kg      Telemetry/ECG  Sinus brady with HR in the 40s-50s - Personally Reviewed  Physical Exam  GEN: No acute distress.  Sitting comfortably in the bed Neck: No JVD Cardiac:  RRR, no murmurs, rubs, or gallops.  Respiratory: Clear to auscultation bilaterally. Normal WOB on room air  GI: Soft, nontender, non-distended  MS: No edema in BLE  Assessment & Plan   Stable Angina  - Patient presented with chest pain on exertion. Trop negative x2. EKG with evidence of old infarct  - Patient has multiple risk factors for CAD including family history of premature CAD, HTN, HLD  - Echocardiogram pending  - Coronary CTA ordered yesterday - I have messaged the cardiac imaging schedulers to arrange today  - Continue amlodipine 10 mg daily. Not candidate for BB due to bradycardia  - Continue ASA 81 mg daily  - Continue fenofibrate  160 mg daily, crestor  40 mg daily   HTN  - Continue amlodipine 10 mg daily and lisinopril  2.5 mg daily   HLD  - Continue fenofibrate  160 mg daily, crestor  40 mg daily  - LP(a)  pending   For questions or updates, please contact [facility] Please consult [email or web address] for contact info under    Signed, Rollo FABIENE Louder, PA-C

## 2024-05-19 NOTE — Interval H&P Note (Signed)
 History and Physical Interval Note:  05/19/2024 3:37 PM  Matthew Patterson  has presented today for surgery, with the diagnosis of unstable angina.  The various methods of treatment have been discussed with the patient and family. After consideration of risks, benefits and other options for treatment, the patient has consented to  Procedure(s): RIGHT/LEFT HEART CATH AND CORONARY ANGIOGRAPHY (N/A) as a surgical intervention.  The patient's history has been reviewed, patient examined, no change in status, stable for surgery.  I have reviewed the patient's chart and labs.  Questions were answered to the patient's satisfaction.   Cath Lab Visit (complete for each Cath Lab visit)  Clinical Evaluation Leading to the Procedure:   ACS: No.  Non-ACS:    Anginal Classification: CCS III  Anti-ischemic medical therapy: No Therapy  Non-Invasive Test Results: High-risk stress test findings: cardiac mortality >3%/year  Prior CABG: No previous CABG        [facility] 05/19/2024 3:37 PM

## 2024-05-19 NOTE — Progress Notes (Signed)
  Echocardiogram 2D Echocardiogram has been performed.  Tinnie FORBES Gosling RDCS 05/19/2024, 9:12 AM

## 2024-05-19 NOTE — Hospital Course (Signed)
 Matthew Patterson is a 68 y.o. male with past medical history significant for hyperlipidemia, diabetes mellitus type 2, history of melanoma, anxiety, depression, and GERD presented to the hospital with chest and back pain, shortness of breath, and dizziness symptoms going on for few weeks..  Symptoms most with physical exertion.  In the ED, patient had stable vitals except for mild bradycardia.  Patient had normal CBC BMP.  D-dimer was less than 0.2.  HIV was nonreactive.  Troponin was 15 followed by 19. TSH of 1.6.  EKG showed some inverted T waves in V1 and inferior leads.  Heart score was 7 on presentation.  Patient was then considered for further evaluation and treatment.  Chest pain secondary to multivessel coronary artery disease Cardiology was consulted for ongoing chest pain with abnormal EKG..  Holding beta-blocker due to bradycardia.  Continue aspirin Crestor  lisinopril .  Discontinue amlodipine since patient stated that he does not use it.   Patient underwent cardiac catheterization on 05/19/2024 which showed severe multivessel coronary artery disease not amenable to PCI..  2D echocardiogram showed LV ejection fraction of 60 to 65% with no regional wall motion abnormality.  On heparin drip at this time.  Cardiothoracic surgery has been consulted for possible CABG evaluation.  Hypertension.  Continue lisinopril  2.5 mg daily.  Not on beta-blockers due to bradycardia.  Does not wish to take amlodipine since he states that he does not take it at home.  Sinus bradycardia Asymptomatic.   Controlled diabetes mellitus type 2, without long-term use of insulin Last hemoglobin A1c reported to be 6.2.  Continue to hold metformin .  Continue Accu-Cheks, diabetic diet.   Acute kidney injury on chronic kidney disease Improved.  Latest creatinine 1.3 from initial creatinine of 1.5.    Hyperlipidemia Last LDL noted to be 71.  Continue Crestor  40 mg daily.  And fenofibrate    Arthritis Chronic NSAIDs at  home.   Cigar smoker Smokes 5 cigars a day.  Counseling done on quitting.

## 2024-05-19 NOTE — Progress Notes (Signed)
 PHARMACY - ANTICOAGULATION CONSULT NOTE  Pharmacy Consult for heparin Indication: chest pain/ACS  No Known Allergies  Patient Measurements: Height: 5' 8 (172.7 cm) Weight: 80.3 kg (177 lb) IBW/kg (Calculated) : 68.4 HEPARIN DW (KG): 80.3  Vital Signs: Temp: 97.9 F (36.6 C) (10/17 1241) Temp Source: Oral (10/17 1241) BP: 125/79 (10/17 1613) Pulse Rate: 74 (10/17 1613)  Labs: Recent Labs    05/17/24 2243 05/18/24 1638 05/19/24 0413  HGB 13.5 15.1 14.3  HCT 39.4 44.1 41.3  PLT 176 198 175  CREATININE 1.55* 1.15 1.40*    Estimated Creatinine Clearance: 48.9 mL/min (A) (by C-G formula based on SCr of 1.4 mg/dL (H)).   Medical History: Past Medical History:  Diagnosis Date   Anxiety and depression    Diabetes mellitus without complication (HCC)    GERD (gastroesophageal reflux disease)    Hyperlipidemia    Melanoma (HCC) 2023   back      Assessment: 68 yo M s/p cath 10/17 found to have multivessel CAD pending TCTS consult. No anticoagulation prior to admission. Pharmacy consulted for heparin.    Received heparin 4000 units in cath at 16:05. TR band removed around 1900.   Goal of Therapy:  Heparin level 0.3-0.7 units/ml Monitor platelets by anticoagulation protocol: Yes   Plan:  Start heparin 1200 units/hr at 21:00, no bolus  Monitor daily heparin level, CBC, signs/symptoms of bleeding  F/u TCTS consult  Jinnie Door, PharmD, BCPS, BCCP Clinical Pharmacist  Please check AMION for all [facility] Pharmacy phone numbers After 10:00 PM, call Main Pharmacy [phone]

## 2024-05-20 DIAGNOSIS — I10 Essential (primary) hypertension: Secondary | ICD-10-CM | POA: Diagnosis present

## 2024-05-20 DIAGNOSIS — Z8249 Family history of ischemic heart disease and other diseases of the circulatory system: Secondary | ICD-10-CM | POA: Diagnosis not present

## 2024-05-20 DIAGNOSIS — F419 Anxiety disorder, unspecified: Secondary | ICD-10-CM | POA: Diagnosis present

## 2024-05-20 DIAGNOSIS — Z01810 Encounter for preprocedural cardiovascular examination: Secondary | ICD-10-CM | POA: Diagnosis not present

## 2024-05-20 DIAGNOSIS — F17210 Nicotine dependence, cigarettes, uncomplicated: Secondary | ICD-10-CM | POA: Diagnosis present

## 2024-05-20 DIAGNOSIS — Z7984 Long term (current) use of oral hypoglycemic drugs: Secondary | ICD-10-CM | POA: Diagnosis not present

## 2024-05-20 DIAGNOSIS — Z833 Family history of diabetes mellitus: Secondary | ICD-10-CM | POA: Diagnosis not present

## 2024-05-20 DIAGNOSIS — I4891 Unspecified atrial fibrillation: Secondary | ICD-10-CM | POA: Diagnosis not present

## 2024-05-20 DIAGNOSIS — I2584 Coronary atherosclerosis due to calcified coronary lesion: Secondary | ICD-10-CM | POA: Diagnosis not present

## 2024-05-20 DIAGNOSIS — I25110 Atherosclerotic heart disease of native coronary artery with unstable angina pectoris: Secondary | ICD-10-CM | POA: Diagnosis present

## 2024-05-20 DIAGNOSIS — I2510 Atherosclerotic heart disease of native coronary artery without angina pectoris: Secondary | ICD-10-CM | POA: Diagnosis not present

## 2024-05-20 DIAGNOSIS — I6522 Occlusion and stenosis of left carotid artery: Secondary | ICD-10-CM | POA: Diagnosis present

## 2024-05-20 DIAGNOSIS — Z85820 Personal history of malignant melanoma of skin: Secondary | ICD-10-CM | POA: Diagnosis not present

## 2024-05-20 DIAGNOSIS — G4700 Insomnia, unspecified: Secondary | ICD-10-CM | POA: Diagnosis present

## 2024-05-20 DIAGNOSIS — Z951 Presence of aortocoronary bypass graft: Secondary | ICD-10-CM | POA: Diagnosis not present

## 2024-05-20 DIAGNOSIS — R001 Bradycardia, unspecified: Secondary | ICD-10-CM | POA: Diagnosis not present

## 2024-05-20 DIAGNOSIS — M5030 Other cervical disc degeneration, unspecified cervical region: Secondary | ICD-10-CM | POA: Diagnosis present

## 2024-05-20 DIAGNOSIS — E119 Type 2 diabetes mellitus without complications: Secondary | ICD-10-CM | POA: Diagnosis not present

## 2024-05-20 DIAGNOSIS — Z79899 Other long term (current) drug therapy: Secondary | ICD-10-CM | POA: Diagnosis not present

## 2024-05-20 DIAGNOSIS — F17290 Nicotine dependence, other tobacco product, uncomplicated: Secondary | ICD-10-CM | POA: Diagnosis present

## 2024-05-20 DIAGNOSIS — K2100 Gastro-esophageal reflux disease with esophagitis, without bleeding: Secondary | ICD-10-CM | POA: Diagnosis present

## 2024-05-20 DIAGNOSIS — N179 Acute kidney failure, unspecified: Secondary | ICD-10-CM | POA: Diagnosis present

## 2024-05-20 DIAGNOSIS — R079 Chest pain, unspecified: Secondary | ICD-10-CM | POA: Diagnosis present

## 2024-05-20 DIAGNOSIS — D62 Acute posthemorrhagic anemia: Secondary | ICD-10-CM | POA: Diagnosis not present

## 2024-05-20 DIAGNOSIS — D6959 Other secondary thrombocytopenia: Secondary | ICD-10-CM | POA: Diagnosis not present

## 2024-05-20 DIAGNOSIS — E1151 Type 2 diabetes mellitus with diabetic peripheral angiopathy without gangrene: Secondary | ICD-10-CM | POA: Diagnosis present

## 2024-05-20 DIAGNOSIS — E782 Mixed hyperlipidemia: Secondary | ICD-10-CM | POA: Diagnosis present

## 2024-05-20 LAB — BASIC METABOLIC PANEL WITH GFR
Anion gap: 10 (ref 5–15)
BUN: 27 mg/dL — ABNORMAL HIGH (ref 8–23)
CO2: 21 mmol/L — ABNORMAL LOW (ref 22–32)
Calcium: 8.9 mg/dL (ref 8.9–10.3)
Chloride: 106 mmol/L (ref 98–111)
Creatinine, Ser: 1.46 mg/dL — ABNORMAL HIGH (ref 0.61–1.24)
GFR, Estimated: 52 mL/min — ABNORMAL LOW (ref >60)
Glucose, Bld: 134 mg/dL — ABNORMAL HIGH (ref 70–99)
Potassium: 3.8 mmol/L (ref 3.5–5.1)
Sodium: 137 mmol/L (ref 135–145)

## 2024-05-20 LAB — CBC
HCT: 42.7 % (ref 39.0–52.0)
Hemoglobin: 14.6 g/dL (ref 13.0–17.0)
MCH: 31.7 pg (ref 26.0–34.0)
MCHC: 34.2 g/dL (ref 30.0–36.0)
MCV: 92.6 fL (ref 80.0–100.0)
Platelets: 171 K/uL (ref 150–400)
RBC: 4.61 MIL/uL (ref 4.22–5.81)
RDW: 12.1 % (ref 11.5–15.5)
WBC: 6.5 K/uL (ref 4.0–10.5)
nRBC: 0 % (ref 0.0–0.2)

## 2024-05-20 LAB — HEPARIN LEVEL (UNFRACTIONATED)
Heparin Unfractionated: 0.34 [IU]/mL (ref 0.30–0.70)
Heparin Unfractionated: <0.1 [IU]/mL — ABNORMAL LOW (ref 0.30–0.70)
Heparin Unfractionated: 0.57 [IU]/mL (ref 0.30–0.70)

## 2024-05-20 MED ORDER — HEPARIN BOLUS VIA INFUSION
2250.0000 [IU] | Freq: Once | INTRAVENOUS | Status: AC
  Administered 2024-05-20: 2250 [IU] via INTRAVENOUS
  Filled 2024-05-20: qty 2250

## 2024-05-20 NOTE — Progress Notes (Signed)
  Progress Note   Patient: Matthew Patterson FMW:[id number] DOB: 1955-08-08 DOA: 05/17/2024     0 DOS: the patient was seen and examined on 05/20/2024   Brief hospital course: Matthew Patterson is a 68 y.o. male with medical history significant of hyperlipidemia, diabetes mellitus type 2, history of melanoma, anxiety, depression, and GERD presents with chest and back pain, shortness of breath, and dizziness. He is accompanied by his wife.   He has been experiencing shortness of breath, pain in his back and chest, and dizziness for the past few weeks. The pain originates in the back and seems to go to the the middle of the chest. These symptoms occur with physical exertion, such as when he was planting flowers and watering, and they resolve with rest after a few minutes. No specific movements reproduce the pain.    Assessment and Plan:  Severe multivessel CAD/chest pain Cannot use beta-blocker due to bradycardia Continue amlodipine, aspirin, rosuvastatin , lisinopril  2.5 mg. Coronary CT angiogram showed multivessel coronary artery disease. LHC 10/17 and CT surgery consult pending.   Sinus bradycardia Patient noted to have heart rates in the 40s to 50s at baseline as he is very active. - Follow-up telemetry    Controlled diabetes mellitus type 2, without long-term use of insulin Last hemoglobin A1c reported to be 6.2. - Hold metformin  while in the hospital   Acute kidney injury on chronic kidney disease Resolved.  Creatinine 1.46 10/18 - Noted baseline creatinine down to 1.15 with BUN 19. - Continue to monitor kidney function   Dyslipidemia Last LDL noted to be 71. - Continue Crestor  and fenofibrate    Arthritis - Advised patient on the risk of chronic NSAID use   Cigar smoker Patient reports smoking about 5 cigars/day on average. - Counseled patient on need of cessation of tobacco use         Subjective: Denies any chest pain  Physical Exam: Vitals:   05/19/24 1942 05/20/24  0056 05/20/24 0532 05/20/24 0826  BP: 108/68 108/72 116/64 104/72  Pulse: (!) 55 (!) 57 61 (!) 51  Resp: 16 17 19 16   Temp: 98 F (36.7 C) 97.9 F (36.6 C) 97.8 F (36.6 C) 97.9 F (36.6 C)  TempSrc: Oral Oral Oral Oral  SpO2: 97% 95% 97%   Weight:      Height:       Constitutional: Alert, awake, calm, comfortable HEENT: Neck supple Respiratory: clear to auscultation bilaterally, no wheezing, no crackles. Normal respiratory effort. No accessory muscle use.  Cardiovascular: Regular rate and rhythm, no murmurs / rubs / gallops. No extremity edema. 2+ pedal pulses. No carotid bruits.  Abdomen: no tenderness, no masses palpated. No hepatosplenomegaly. Bowel sounds positive.  Musculoskeletal: no clubbing / cyanosis. No joint deformity upper and lower extremities. Good ROM, no contractures. Normal muscle tone.  Skin: no rashes, lesions, ulcers. No induration Neurologic: CN 2-12 grossly intact. Sensation intact, DTR normal. Strength 5/5 x all 4 extremities.  Psychiatric: Normal judgment and insight. Alert and oriented x 3. Normal mood.   Data Reviewed:  Creatinine 1.46  Family Communication: Wife and son was at bedside  Disposition: Status is: Observation The patient will require care spanning > 2 midnights and should be moved to inpatient because: Multivessel coronary artery disease requiring coronary artery bypass graft consult pending  Planned Discharge Destination: Home with Home Health    Time spent: 35 minutes  Author: Nena Rebel, MD 05/20/2024 12:35 PM  For on call review [email or web address].

## 2024-05-20 NOTE — Progress Notes (Addendum)
 PHARMACY - ANTICOAGULATION CONSULT NOTE  Pharmacy Consult for heparin Indication: chest pain/ACS, s/p cath, awaiting CVTS consult  No Known Allergies  Patient Measurements: Height: 5' 8 (172.7 cm) Weight: 80.3 kg (177 lb) IBW/kg (Calculated) : 68.4 HEPARIN DW (KG): 80.3  Vital Signs: Temp: 98 F (36.7 C) (10/18 1310) Temp Source: Oral (10/18 1310) BP: 104/72 (10/18 0826) Pulse Rate: 51 (10/18 0826)  Labs: Recent Labs    05/18/24 1638 05/19/24 0413 05/19/24 1559 05/19/24 1602 05/19/24 1603 05/20/24 0441 05/20/24 1359  HGB 15.1 14.3   < > 14.6 14.3 14.6  --   HCT 44.1 41.3   < > 43.0 42.0 42.7  --   PLT 198 175  --   --   --  171  --   HEPARINUNFRC  --   --   --   --   --  0.34 <0.10*  CREATININE 1.15 1.40*  --   --   --  1.46*  --    < > = values in this interval not displayed.    Estimated Creatinine Clearance: 46.8 mL/min (A) (by C-G formula based on SCr of 1.46 mg/dL (H)).   Medical History: Past Medical History:  Diagnosis Date   Anxiety and depression    Diabetes mellitus without complication (HCC)    GERD (gastroesophageal reflux disease)    Hyperlipidemia    Melanoma (HCC) 2023   back      Assessment: 68 yo M s/p cath 10/17 found to have multivessel CAD pending TCTS consult. No anticoagulation prior to admission. Pharmacy consulted for heparin.    Received heparin 4000 units in cath on 10/17 at 16:05. TR band removed around 1900.   Heparin level this afternoon is undetectable (<0.1). Confirmed with RN that heparin was running with no interruptions and that IV was working. Will bolus and increase to get therapeutic. CBC stable: Hgb 14.6, platelets 171.  Goal of Therapy:  Heparin level 0.3-0.7 units/ml Monitor platelets by anticoagulation protocol: Yes   Plan:  Bolus 2250 units x1 Increase heparin 1450 units/hr F/U CVTS consult   Elma Fail, PharmD PGY1 Clinical Pharmacist [facility]  05/20/2024 3:10 PM

## 2024-05-20 NOTE — Progress Notes (Signed)
 PHARMACY - ANTICOAGULATION CONSULT NOTE  Pharmacy Consult for heparin Indication: chest pain/ACS, s/p cath, awaiting CVTS consult  No Known Allergies  Patient Measurements: Height: 5' 8 (172.7 cm) Weight: 80.3 kg (177 lb) IBW/kg (Calculated) : 68.4 HEPARIN DW (KG): 80.3  Vital Signs: Temp: 97.8 F (36.6 C) (10/18 0532) Temp Source: Oral (10/18 0532) BP: 116/64 (10/18 0532) Pulse Rate: 61 (10/18 0532)  Labs: Recent Labs    05/18/24 1638 05/19/24 0413 05/19/24 1559 05/19/24 1602 05/19/24 1603 05/20/24 0441  HGB 15.1 14.3   < > 14.6 14.3 14.6  HCT 44.1 41.3   < > 43.0 42.0 42.7  PLT 198 175  --   --   --  171  HEPARINUNFRC  --   --   --   --   --  0.34  CREATININE 1.15 1.40*  --   --   --  1.46*   < > = values in this interval not displayed.    Estimated Creatinine Clearance: 46.8 mL/min (A) (by C-G formula based on SCr of 1.46 mg/dL (H)).   Medical History: Past Medical History:  Diagnosis Date   Anxiety and depression    Diabetes mellitus without complication (HCC)    GERD (gastroesophageal reflux disease)    Hyperlipidemia    Melanoma (HCC) 2023   back      Assessment: 68 yo M s/p cath 10/17 found to have multivessel CAD pending TCTS consult. No anticoagulation prior to admission. Pharmacy consulted for heparin.    Received heparin 4000 units in cath at 16:05. TR band removed around 1900.   10/18 AM update:  Heparin level therapeutic   Goal of Therapy:  Heparin level 0.3-0.7 units/ml Monitor platelets by anticoagulation protocol: Yes   Plan:  Cont heparin 1200 units/hr 1400 heparin level F/U CVTS consult   Lynwood Mckusick, PharmD, BCPS Clinical Pharmacist Phone: [phone]

## 2024-05-20 NOTE — Progress Notes (Signed)
 Rounding Note   Patient Name: Matthew Patterson Date of Encounter: 05/20/2024  [facility] Cardiologist: None   Subjective - No acute events overnight - LHC/RHC yesterday demonstrating severe multivessel disease - Patient relaxing in bed without any symptoms denying chest pain and SOB  Scheduled Meds:  amLODipine  10 mg Oral Daily   aspirin EC  81 mg Oral Daily   fenofibrate   160 mg Oral Daily   lisinopril   2.5 mg Oral Daily   rosuvastatin   40 mg Oral Daily   sodium chloride  flush  3 mL Intravenous Q12H   sodium chloride  flush  3 mL Intravenous Q12H   traZODone   100 mg Oral QHS   Continuous Infusions:  sodium chloride      heparin 1,200 Units/hr (05/20/24 0625)   PRN Meds: sodium chloride , acetaminophen  **OR** acetaminophen , albuterol, ondansetron  (ZOFRAN ) IV, sodium chloride  flush   Vital Signs  Vitals:   05/19/24 1804 05/19/24 1942 05/20/24 0056 05/20/24 0532  BP: 115/70 108/68 108/72 116/64  Pulse: (!) 58 (!) 55 (!) 57 61  Resp:  16 17 19   Temp:  98 F (36.7 C) 97.9 F (36.6 C) 97.8 F (36.6 C)  TempSrc:  Oral Oral Oral  SpO2: 97% 97% 95% 97%  Weight:      Height:        Intake/Output Summary (Last 24 hours) at 05/20/2024 0735 Last data filed at 05/20/2024 9374 Gross per 24 hour  Intake 2077.04 ml  Output 400 ml  Net 1677.04 ml      05/17/2024   10:39 PM 05/05/2024    7:04 AM 12/02/2023    7:55 AM  Last 3 Weights  Weight (lbs) 177 lb 179 lb 177 lb 6.4 oz  Weight (kg) 80.287 kg 81.194 kg 80.468 kg      Telemetry Sinus bradycardia- Personally Reviewed  ECG  No new ECG  Physical Exam GEN: No acute distress.   Neck: No JVD Cardiac: RRR, no murmurs, rubs, or gallops.  Respiratory: Clear to auscultation bilaterally. GI: Soft, nontender, non-distended  MS: No edema; No deformity. Neuro:  Nonfocal  Psych: Normal affect   Labs High Sensitivity Troponin:  No results for input(s): TROPONINIHS in the last 720 hours.   Chemistry Recent  Labs  Lab 05/17/24 2243 05/18/24 1638 05/19/24 0413 05/19/24 1559 05/19/24 1602 05/19/24 1603 05/20/24 0441  NA 140 141 138   < > 140 140 137  K 4.1 3.8 3.8   < > 3.7 3.5 3.8  CL 106 107 108  --   --   --  106  CO2 22 26 22   --   --   --  21*  GLUCOSE 214* 91 107*  --   --   --  134*  BUN 28* 19 22  --   --   --  27*  CREATININE 1.55* 1.15 1.40*  --   --   --  1.46*  CALCIUM  9.0 8.9 8.7*  --   --   --  8.9  PROT 6.5  --   --   --   --   --   --   ALBUMIN 4.2  --   --   --   --   --   --   AST 23  --   --   --   --   --   --   ALT 19  --   --   --   --   --   --  ALKPHOS 34*  --   --   --   --   --   --   BILITOT 0.4  --   --   --   --   --   --   GFRNONAA 48* >60 55*  --   --   --  52*  ANIONGAP 12 8 8   --   --   --  10   < > = values in this interval not displayed.    Lipids No results for input(s): CHOL, TRIG, HDL, LABVLDL, LDLCALC, CHOLHDL in the last 168 hours.  Hematology Recent Labs  Lab 05/18/24 1638 05/19/24 0413 05/19/24 1559 05/19/24 1602 05/19/24 1603 05/20/24 0441  WBC 5.1 5.7  --   --   --  6.5  RBC 4.76 4.45  --   --   --  4.61  HGB 15.1 14.3   < > 14.6 14.3 14.6  HCT 44.1 41.3   < > 43.0 42.0 42.7  MCV 92.6 92.8  --   --   --  92.6  MCH 31.7 32.1  --   --   --  31.7  MCHC 34.2 34.6  --   --   --  34.2  RDW 12.2 12.3  --   --   --  12.1  PLT 198 175  --   --   --  171   < > = values in this interval not displayed.   Thyroid   Recent Labs  Lab 05/18/24 1638  TSH 1.623    BNPNo results for input(s): BNP, PROBNP in the last 168 hours.  DDimer  Recent Labs  Lab 05/18/24 1638  DDIMER <0.27     Radiology  CARDIAC CATHETERIZATION Result Date: 05/19/2024   Ost RCA to Prox RCA lesion is 100% stenosed.   Ost Cx to Prox Cx lesion is 90% stenosed.   1st Mrg lesion is 90% stenosed.   3rd Mrg lesion is 100% stenosed.   LPAV lesion is 100% stenosed with 100% stenosed side branch in LPDA.   Ost LAD lesion is 70% stenosed.   Prox LAD  lesion is 90% stenosed.   Mid LAD lesion is 95% stenosed.   Mid LAD to Dist LAD lesion is 85% stenosed.   The left ventricular systolic function is normal.   LV end diastolic pressure is normal.   The left ventricular ejection fraction is 55-65% by visual estimate. Critical multivessel obstructive CAD Normal LV function Normal LV filling pressures. LVEDP 9 mm Hg, PCWP 11/9, mean 11 mm Hg Normal right heart pressures. PAP 30/10, mean 17 mm Hg Normal cardiac output. 5.31 L/min, index 2.74 Plan: CT surgery consult for CABG. Not suitable for PCI   CT CORONARY FRACTIONAL FLOW RESERVE FLUID ANALYSIS Result Date: 05/19/2024 EXAM: FFRCT ANALYSIS for abnormal coronary CT-A. FINDINGS: FFRct analysis was performed on the original cardiac CT angiogram dataset. Diagrammatic representation of the FFRct analysis is provided in a separate PDF document in PACS. This dictation was created using the PDF document and an interactive 3D model of the results. 3D model is not available in the EMR/PACS. Normal FFR range is >0.80. 1. Left Main: Very short.  FFRct 1.0. 2. LAD: FFRct 0.87 ostial, 0.81 proximal, 0.72 mid. Distal LAD is not modeled. 3. LCX: FFRct 0.99 ostial. 0.8 proximal, 0.77 mid, 0.71 distal. L-PDA modeled as occluded. OM1 and OM2 are not modeled. OM3 FFRct 0.78 proximal, 0.57 distal. 4.       RCA: Occlusion at the  ostium IMPRESSION: 1. FFRct findings are consistent with 100% occlusion at the ostium of the RCA and at the L-PDA. 2. Findings are concerning for severe stenosis in the mid LAD, distal LCX and OM3. 3.  Recommend cardiac catheterization. 4. LM is very short. Recommend care in engaging the LM, as there is almost dual ostia for the LAD and CX and concern for significant ostial disease in both vessels. Tiffany C. Raford, MD Electronically Signed   By: Annabella Raford M.D.   On: 05/19/2024 12:51   CT CORONARY MORPH W/CTA COR W/SCORE W/CA W/CM &/OR WO/CM Result Date: 05/19/2024 CLINICAL DATA:  23M with  hypertension, hyperlipidemia, diabetes and chest pain. EXAM: Cardiac/Coronary  CT TECHNIQUE: The patient was scanned on a [employer]. PROTOCOL: A 120 kV prospective scan was triggered in the descending thoracic aorta at 111 HU's. Axial non-contrast 3 mm slices were carried out through the heart. The data set was analyzed on a dedicated work station and scored using the Agatson method. Gantry rotation speed was 250 msecs and collimation was .6 mm. No beta blockade and 0.8 mg of sl NTG was given. The 3D data set was reconstructed in 5% intervals of the 67-82 % of the R-R cycle. Diastolic phases were analyzed on a dedicated work station using MPR, MIP and VRT modes. The patient received 80 cc of contrast. FINDINGS: Aorta: Normal size.  Aortic atherosclerosis.   No dissection. Aortic Valve:  Trileaflet.  No calcifications. Coronary Arteries:  Normal coronary origin.  Right dominance. RCA is a small non-dominant artery that gives rise to PDA and PLVB. There is 100% occlusion at the ostium and severe (>70%) mixed plaque in the proximal RCA. Left main is a large artery that gives rise to LAD and LCX arteries. There is minimal (<25%) calcified plaque. LAD is a large vessel that has extensive mixed plaque. There is moderate (50-69%) mixed plaque in the ostium and severe (>70%) mixed plaque in the mid and distal LAD. D1 and D2 are small vessels that are heavily diseased. LCX is a dominant artery with moderate (50-69%) mixed plaque in the proximal and distal vessel. There is severe (>70%) stenosis in L-PDA. There is severe (>70%) stenosis in OM1. There is severe (>70%) stenosis at the ostium of OM2. Coronary Calcium  Score: Left main: 112 Left anterior descending artery: 1007 Left circumflex artery: 747 Right coronary artery: 77.9 Total: 1944 Percentile: 95th Other findings: Normal pulmonary vein drainage into the left atrium. Normal let atrial appendage without a thrombus. Normal size of the pulmonary artery.  Non-cardiac: See separate report from [facility]. IMPRESSION: 1. Coronary calcium  score of 1944. This was 95th percentile for age-, sex, and race-matched controls. 2. Normal coronary origin with left dominance. 3. There is 100% occlusion at the ostium and severe (>70%) mixed plaque in the proximal RCA. There is severe (>70%) stenosis in the LAD, OM1, OM2 and L-PDA. There is moderate (50-69%) stenosis in the proximal and distal LAD. CAD-RADS 5. 4.  Willl send for FFRct. 5.  Recommend cardiac catheterization. RECOMMENDATIONS: CAD-RADS 5: Total coronary occlusion (100%). Consider cardiac catheterization or viability assessment. Consider symptom-guided anti-ischemic pharmacotherapy as well as risk factor modification per guideline directed care. Annabella Raford, MD Electronically Signed   By: Annabella Raford M.D.   On: 05/19/2024 12:42   ECHOCARDIOGRAM COMPLETE Result Date: 05/19/2024    ECHOCARDIOGRAM REPORT   Patient Name:   Matthew [facility] Date of Exam: 05/19/2024 Medical Rec #:  [id number]  Height:       68.0 in Accession #:    [id number]      Weight:       177.0 lb Date of Birth:  Jan 30, 1956       BSA:          1.940 m Patient Age:    68 years        BP:           117/79 mmHg Patient Gender: M               HR:           50 bpm. Exam Location:  Inpatient Procedure: 2D Echo, Cardiac Doppler and Color Doppler (Both Spectral and Color            Flow Doppler were utilized during procedure). Indications:    Chest Pain R07.9  History:        Patient has no prior history of Echocardiogram examinations.                 Previous Myocardial Infarction; Risk Factors:Hypertension,                 Diabetes and Dyslipidemia.  Sonographer:    Tinnie Gosling RDCS Referring Phys: [phone] RONDELL A SMITH IMPRESSIONS  1. Left ventricular ejection fraction, by estimation, is 60 to 65%. The left ventricle has normal function. The left ventricle has no regional wall motion abnormalities. Left ventricular diastolic  parameters were normal.  2. Right ventricular systolic function is normal. The right ventricular size is normal. Tricuspid regurgitation signal is inadequate for assessing PA pressure.  3. Left atrial size was mildly dilated.  4. The mitral valve is normal in structure. No evidence of mitral valve regurgitation. No evidence of mitral stenosis.  5. The aortic valve is normal in structure. Aortic valve regurgitation is not visualized. No aortic stenosis is present.  6. The inferior vena cava is normal in size with greater than 50% respiratory variability, suggesting right atrial pressure of 3 mmHg.  7. Cannot exclude a small PFO. Comparison(s): No prior Echocardiogram. FINDINGS  Left Ventricle: Left ventricular ejection fraction, by estimation, is 60 to 65%. The left ventricle has normal function. The left ventricle has no regional wall motion abnormalities. The left ventricular internal cavity size was normal in size. There is  no left ventricular hypertrophy. Left ventricular diastolic parameters were normal. Right Ventricle: The right ventricular size is normal. No increase in right ventricular wall thickness. Right ventricular systolic function is normal. Tricuspid regurgitation signal is inadequate for assessing PA pressure. Left Atrium: Left atrial size was mildly dilated. Right Atrium: Right atrial size was normal in size. Pericardium: There is no evidence of pericardial effusion. Presence of epicardial fat layer. Mitral Valve: The mitral valve is normal in structure. No evidence of mitral valve regurgitation. No evidence of mitral valve stenosis. Tricuspid Valve: The tricuspid valve is normal in structure. Tricuspid valve regurgitation is trivial. No evidence of tricuspid stenosis. Aortic Valve: The aortic valve is normal in structure. Aortic valve regurgitation is not visualized. No aortic stenosis is present. Pulmonic Valve: The pulmonic valve was normal in structure. Pulmonic valve regurgitation is not  visualized. No evidence of pulmonic stenosis. Aorta: The aortic root and ascending aorta are structurally normal, with no evidence of dilitation. Venous: The inferior vena cava is normal in size with greater than 50% respiratory variability, suggesting right atrial pressure of 3 mmHg. IAS/Shunts: Cannot exclude a small PFO.  LEFT VENTRICLE PLAX  2D LVIDd:         4.40 cm   Diastology LVIDs:         2.60 cm   LV e' medial:    7.29 cm/s LV PW:         1.00 cm   LV E/e' medial:  10.5 LV IVS:        1.00 cm   LV e' lateral:   7.94 cm/s LVOT diam:     2.00 cm   LV E/e' lateral: 9.7 LV SV:         71 LV SV Index:   37 LVOT Area:     3.14 cm  RIGHT VENTRICLE             IVC RV S prime:     10.40 cm/s  IVC diam: 1.60 cm TAPSE (M-mode): 2.2 cm LEFT ATRIUM             Index        RIGHT ATRIUM           Index LA diam:        3.50 cm 1.80 cm/m   RA Area:     12.20 cm LA Vol (A2C):   56.3 ml 29.02 ml/m  RA Volume:   26.10 ml  13.45 ml/m LA Vol (A4C):   44.5 ml 22.93 ml/m LA Biplane Vol: 51.2 ml 26.39 ml/m  AORTIC VALVE LVOT Vmax:   96.00 cm/s LVOT Vmean:  66.500 cm/s LVOT VTI:    0.227 m  AORTA Ao Root diam: 2.90 cm Ao Asc diam:  3.30 cm MITRAL VALVE MV Area (PHT): 3.01 cm    SHUNTS MV Decel Time: 252 msec    Systemic VTI:  0.23 m MV E velocity: 76.70 cm/s  Systemic Diam: 2.00 cm MV A velocity: 83.60 cm/s MV E/A ratio:  0.92 Emeline Calender Electronically signed by Emeline Calender Signature Date/Time: 05/19/2024/11:46:14 AM    Final     Cardiac Studies  LHC/RHC 05/19/24:    Ost RCA to Prox RCA lesion is 100% stenosed.   Ost Cx to Prox Cx lesion is 90% stenosed.   1st Mrg lesion is 90% stenosed.   3rd Mrg lesion is 100% stenosed.   LPAV lesion is 100% stenosed with 100% stenosed side branch in LPDA.   Ost LAD lesion is 70% stenosed.   Prox LAD lesion is 90% stenosed.   Mid LAD lesion is 95% stenosed.   Mid LAD to Dist LAD lesion is 85% stenosed.   The left ventricular systolic function is normal.   LV end  diastolic pressure is normal.   The left ventricular ejection fraction is 55-65% by visual estimate.   Critical multivessel obstructive CAD Normal LV function Normal LV filling pressures. LVEDP 9 mm Hg, PCWP 11/9, mean 11 mm Hg Normal right heart pressures. PAP 30/10, mean 17 mm Hg Normal cardiac output. 5.31 L/min, index 2.74  TTE 05/19/24:  IMPRESSIONS     1. Left ventricular ejection fraction, by estimation, is 60 to 65%. The  left ventricle has normal function. The left ventricle has no regional  wall motion abnormalities. Left ventricular diastolic parameters were  normal.   2. Right ventricular systolic function is normal. The right ventricular  size is normal. Tricuspid regurgitation signal is inadequate for assessing  PA pressure.   3. Left atrial size was mildly dilated.   4. The mitral valve is normal in structure. No evidence of mitral valve  regurgitation. No evidence of  mitral stenosis.   5. The aortic valve is normal in structure. Aortic valve regurgitation is  not visualized. No aortic stenosis is present.   6. The inferior vena cava is normal in size with greater than 50%  respiratory variability, suggesting right atrial pressure of 3 mmHg.   7. Cannot exclude a small PFO.   Patient Profile   Matix Henshaw is a 68 y.o. male with a PMH of HTN, HLD, and DM2 who is being evaluated for chest pain found to have severe multivessel disease.  Assessment & Plan   #Severe Multivessel CAD :: Underwent LHC on 10/17 revealing multivessel disease not amenable to PCI.  Now pending CABG evaluation.  Patient stable on heparin and aspirin.  Will reach out to CT surgery today for evaluation. - Consult CT surgery - Continue heparin gtt - Continue aspirin 81 mg daily  #HLD - Continue Crestor  40 mg daily  #HTN - BP is stable - Continue lisinopril  2.5 mg daily - Continue amlodipine 10 mg daily       For questions or updates, please contact [facility] Please  consult [email or web address] for contact info under       Signed, Georganna Archer, MD  05/20/2024, 7:35 AM

## 2024-05-21 ENCOUNTER — Encounter (HOSPITAL_COMMUNITY): Payer: Self-pay | Admitting: Cardiology

## 2024-05-21 DIAGNOSIS — I25110 Atherosclerotic heart disease of native coronary artery with unstable angina pectoris: Secondary | ICD-10-CM | POA: Diagnosis not present

## 2024-05-21 DIAGNOSIS — R079 Chest pain, unspecified: Secondary | ICD-10-CM | POA: Diagnosis not present

## 2024-05-21 LAB — CBC
HCT: 41.9 % (ref 39.0–52.0)
Hemoglobin: 14.6 g/dL (ref 13.0–17.0)
MCH: 32.2 pg (ref 26.0–34.0)
MCHC: 34.8 g/dL (ref 30.0–36.0)
MCV: 92.3 fL (ref 80.0–100.0)
Platelets: 188 K/uL (ref 150–400)
RBC: 4.54 MIL/uL (ref 4.22–5.81)
RDW: 12.1 % (ref 11.5–15.5)
WBC: 6.1 K/uL (ref 4.0–10.5)
nRBC: 0 % (ref 0.0–0.2)

## 2024-05-21 LAB — URINALYSIS, ROUTINE W REFLEX MICROSCOPIC
Bilirubin Urine: NEGATIVE
Glucose, UA: NEGATIVE mg/dL
Hgb urine dipstick: NEGATIVE
Ketones, ur: NEGATIVE mg/dL
Leukocytes,Ua: NEGATIVE
Nitrite: NEGATIVE
Protein, ur: NEGATIVE mg/dL
Specific Gravity, Urine: 1.011 (ref 1.005–1.030)
pH: 6 (ref 5.0–8.0)

## 2024-05-21 LAB — BASIC METABOLIC PANEL WITH GFR
Anion gap: 10 (ref 5–15)
BUN: 27 mg/dL — ABNORMAL HIGH (ref 8–23)
CO2: 21 mmol/L — ABNORMAL LOW (ref 22–32)
Calcium: 9.1 mg/dL (ref 8.9–10.3)
Chloride: 107 mmol/L (ref 98–111)
Creatinine, Ser: 1.36 mg/dL — ABNORMAL HIGH (ref 0.61–1.24)
GFR, Estimated: 57 mL/min — ABNORMAL LOW (ref >60)
Glucose, Bld: 133 mg/dL — ABNORMAL HIGH (ref 70–99)
Potassium: 3.7 mmol/L (ref 3.5–5.1)
Sodium: 138 mmol/L (ref 135–145)

## 2024-05-21 LAB — SURGICAL PCR SCREEN
MRSA, PCR: NEGATIVE
Staphylococcus aureus: NEGATIVE

## 2024-05-21 LAB — HEPARIN LEVEL (UNFRACTIONATED)
Heparin Unfractionated: 0.71 [IU]/mL — ABNORMAL HIGH (ref 0.30–0.70)
Heparin Unfractionated: 0.66 [IU]/mL (ref 0.30–0.70)

## 2024-05-21 LAB — ABO/RH: ABO/RH(D): O POS

## 2024-05-21 MED ORDER — METOPROLOL TARTRATE 12.5 MG HALF TABLET
12.5000 mg | ORAL_TABLET | Freq: Once | ORAL | Status: AC
  Administered 2024-05-23: 12.5 mg via ORAL
  Filled 2024-05-21: qty 1

## 2024-05-21 MED ORDER — BISACODYL 5 MG PO TBEC
5.0000 mg | DELAYED_RELEASE_TABLET | Freq: Once | ORAL | Status: AC
  Administered 2024-05-22: 5 mg via ORAL
  Filled 2024-05-21: qty 1

## 2024-05-21 MED ORDER — CHLORHEXIDINE GLUCONATE CLOTH 2 % EX PADS
6.0000 | MEDICATED_PAD | Freq: Once | CUTANEOUS | Status: DC
Start: 2024-05-22 — End: 2024-05-21

## 2024-05-21 MED ORDER — CHLORHEXIDINE GLUCONATE 0.12 % MT SOLN
15.0000 mL | Freq: Once | OROMUCOSAL | Status: AC
  Administered 2024-05-23: 15 mL via OROMUCOSAL
  Filled 2024-05-21: qty 15

## 2024-05-21 MED ORDER — CHLORHEXIDINE GLUCONATE CLOTH 2 % EX PADS
6.0000 | MEDICATED_PAD | Freq: Once | CUTANEOUS | Status: AC
  Administered 2024-05-22: 6 via TOPICAL

## 2024-05-21 MED ORDER — CHLORHEXIDINE GLUCONATE CLOTH 2 % EX PADS: 6.0000 | MEDICATED_PAD | Freq: Once | CUTANEOUS | Status: DC

## 2024-05-21 MED ORDER — TEMAZEPAM 7.5 MG PO CAPS: 15.0000 mg | ORAL_CAPSULE | Freq: Once | ORAL | Status: DC | PRN

## 2024-05-21 MED ORDER — CHLORHEXIDINE GLUCONATE CLOTH 2 % EX PADS
6.0000 | MEDICATED_PAD | Freq: Once | CUTANEOUS | Status: AC
  Administered 2024-05-23: 6 via TOPICAL

## 2024-05-21 NOTE — Progress Notes (Addendum)
 PHARMACY - ANTICOAGULATION CONSULT NOTE  Pharmacy Consult for heparin Indication: chest pain/ACS, s/p cath, awaiting CVTS consult  No Known Allergies  Patient Measurements: Height: 5' 8 (172.7 cm) Weight: 80.3 kg (177 lb) IBW/kg (Calculated) : 68.4 HEPARIN DW (KG): 80.3  Vital Signs: Temp: 97.9 F (36.6 C) (10/19 0631) Temp Source: Oral (10/19 0631) BP: 117/64 (10/19 0631) Pulse Rate: 59 (10/19 0631)  Labs: Recent Labs    05/19/24 0413 05/19/24 1559 05/19/24 1603 05/19/24 1603 05/20/24 0441 05/20/24 1359 05/20/24 2247 05/21/24 0427  HGB 14.3   < > 14.3  --  14.6  --   --  14.6  HCT 41.3   < > 42.0  --  42.7  --   --  41.9  PLT 175  --   --   --  171  --   --  188  HEPARINUNFRC  --   --   --    < > 0.34 <0.10* 0.57 0.71*  CREATININE 1.40*  --   --   --  1.46*  --   --  1.36*   < > = values in this interval not displayed.    Estimated Creatinine Clearance: 50.3 mL/min (A) (by C-G formula based on SCr of 1.36 mg/dL (H)).   Medical History: Past Medical History:  Diagnosis Date   Anxiety and depression    Diabetes mellitus without complication (HCC)    GERD (gastroesophageal reflux disease)    Hyperlipidemia    Melanoma (HCC) 2023   back      Assessment: 68 yo M s/p cath 10/17 found to have multivessel CAD pending TCTS consult. No anticoagulation prior to admission. Pharmacy consulted for heparin.    10/18: Received heparin 4000 units in cath at 16:05. TR band removed around 1900. Also received a 2250 unit bolus x1 prior to cath.  10/19 AM: Heparin level is slightly supratherapeutic at 0.71 on heparin 1450 units/hr. CBC stable: Hgb 14.6, plt 188. No signs of bleeding or issues with the infusion per RN. Heparin level was drawn from the separate arm than the infusion is placed per the patient. Will decrease heparin infusion to keep within therapeutic range.  Goal of Therapy:  Heparin level 0.3-0.7 units/ml Monitor platelets by anticoagulation protocol: Yes    Plan:  Decrease to heparin 1400 units/hr Recheck heparin level in 6 hours Monitor CBC, heparin level and signs of bleeding daily. F/U CVTS consult  ___________________________________________ 10/19 PM update: Heparin level therapeutic at 0.66 on heparin 1400 units/hr. No issues with the infusion or signs of bleeding per RN.   CVTS plan for CABG on 10/21.  Plan:  Continue heparin 1400 units/hr Monitor CBC, heparin level and signs of bleeding daily.  WENDI Amon Rocher, PharmD PGY-1 Pharmacy Resident [facility] 05/21/2024 7:39 AM

## 2024-05-21 NOTE — Progress Notes (Signed)
 Rounding Note   Patient Name: OTHO MICHALIK Date of Encounter: 05/21/2024  [facility] Cardiologist: None   Subjective - No acute events overnight - No complaints  Scheduled Meds:  aspirin EC  81 mg Oral Daily   fenofibrate   160 mg Oral Daily   rosuvastatin   40 mg Oral Daily   sodium chloride  flush  3 mL Intravenous Q12H   traZODone   100 mg Oral QHS   Continuous Infusions:  heparin 1,400 Units/hr (05/21/24 0811)   PRN Meds: acetaminophen  **OR** acetaminophen , albuterol, ondansetron  (ZOFRAN ) IV   Vital Signs  Vitals:   05/20/24 2351 05/21/24 0631 05/21/24 0809 05/21/24 1252  BP: 119/75 117/64 105/73 110/69  Pulse: (!) 52 (!) 59  (!) 57  Resp: 18 16 18 18   Temp: 98.1 F (36.7 C) 97.9 F (36.6 C) 98 F (36.7 C) 98 F (36.7 C)  TempSrc: Oral Oral Oral Oral  SpO2: 100% 98% 95% 99%  Weight:      Height:        Intake/Output Summary (Last 24 hours) at 05/21/2024 1258 Last data filed at 05/21/2024 0600 Gross per 24 hour  Intake 498.22 ml  Output --  Net 498.22 ml      05/17/2024   10:39 PM 05/05/2024    7:04 AM 12/02/2023    7:55 AM  Last 3 Weights  Weight (lbs) 177 lb 179 lb 177 lb 6.4 oz  Weight (kg) 80.287 kg 81.194 kg 80.468 kg      Telemetry NSR- Personally Reviewed  ECG  Noted ECG  Physical Exam  GEN: No acute distress.   Neck: No JVD Cardiac: RRR, no murmurs, rubs, or gallops.  Respiratory: Clear to auscultation bilaterally. GI: Soft, nontender, non-distended  MS: No edema; No deformity. Neuro:  Nonfocal  Psych: Normal affect   Labs High Sensitivity Troponin:  No results for input(s): TROPONINIHS in the last 720 hours.   Chemistry Recent Labs  Lab 05/17/24 2243 05/18/24 1638 05/19/24 0413 05/19/24 1559 05/19/24 1603 05/20/24 0441 05/21/24 0427  NA 140   < > 138   < > 140 137 138  K 4.1   < > 3.8   < > 3.5 3.8 3.7  CL 106   < > 108  --   --  106 107  CO2 22   < > 22  --   --  21* 21*  GLUCOSE 214*   < > 107*  --    --  134* 133*  BUN 28*   < > 22  --   --  27* 27*  CREATININE 1.55*   < > 1.40*  --   --  1.46* 1.36*  CALCIUM  9.0   < > 8.7*  --   --  8.9 9.1  PROT 6.5  --   --   --   --   --   --   ALBUMIN 4.2  --   --   --   --   --   --   AST 23  --   --   --   --   --   --   ALT 19  --   --   --   --   --   --   ALKPHOS 34*  --   --   --   --   --   --   BILITOT 0.4  --   --   --   --   --   --  GFRNONAA 48*   < > 55*  --   --  52* 57*  ANIONGAP 12   < > 8  --   --  10 10   < > = values in this interval not displayed.    Lipids No results for input(s): CHOL, TRIG, HDL, LABVLDL, LDLCALC, CHOLHDL in the last 168 hours.  Hematology Recent Labs  Lab 05/19/24 0413 05/19/24 1559 05/19/24 1603 05/20/24 0441 05/21/24 0427  WBC 5.7  --   --  6.5 6.1  RBC 4.45  --   --  4.61 4.54  HGB 14.3   < > 14.3 14.6 14.6  HCT 41.3   < > 42.0 42.7 41.9  MCV 92.8  --   --  92.6 92.3  MCH 32.1  --   --  31.7 32.2  MCHC 34.6  --   --  34.2 34.8  RDW 12.3  --   --  12.1 12.1  PLT 175  --   --  171 188   < > = values in this interval not displayed.   Thyroid   Recent Labs  Lab 05/18/24 1638  TSH 1.623    BNPNo results for input(s): BNP, PROBNP in the last 168 hours.  DDimer  Recent Labs  Lab 05/18/24 1638  DDIMER <0.27     Radiology  CARDIAC CATHETERIZATION Result Date: 05/19/2024   Ost RCA to Prox RCA lesion is 100% stenosed.   Ost Cx to Prox Cx lesion is 90% stenosed.   1st Mrg lesion is 90% stenosed.   3rd Mrg lesion is 100% stenosed.   LPAV lesion is 100% stenosed with 100% stenosed side branch in LPDA.   Ost LAD lesion is 70% stenosed.   Prox LAD lesion is 90% stenosed.   Mid LAD lesion is 95% stenosed.   Mid LAD to Dist LAD lesion is 85% stenosed.   The left ventricular systolic function is normal.   LV end diastolic pressure is normal.   The left ventricular ejection fraction is 55-65% by visual estimate. Critical multivessel obstructive CAD Normal LV function Normal LV filling  pressures. LVEDP 9 mm Hg, PCWP 11/9, mean 11 mm Hg Normal right heart pressures. PAP 30/10, mean 17 mm Hg Normal cardiac output. 5.31 L/min, index 2.74 Plan: CT surgery consult for CABG. Not suitable for PCI    Cardiac Studies   LHC/RHC 05/19/24:     Ost RCA to Prox RCA lesion is 100% stenosed.   Ost Cx to Prox Cx lesion is 90% stenosed.   1st Mrg lesion is 90% stenosed.   3rd Mrg lesion is 100% stenosed.   LPAV lesion is 100% stenosed with 100% stenosed side branch in LPDA.   Ost LAD lesion is 70% stenosed.   Prox LAD lesion is 90% stenosed.   Mid LAD lesion is 95% stenosed.   Mid LAD to Dist LAD lesion is 85% stenosed.   The left ventricular systolic function is normal.   LV end diastolic pressure is normal.   The left ventricular ejection fraction is 55-65% by visual estimate.   Critical multivessel obstructive CAD Normal LV function Normal LV filling pressures. LVEDP 9 mm Hg, PCWP 11/9, mean 11 mm Hg Normal right heart pressures. PAP 30/10, mean 17 mm Hg Normal cardiac output. 5.31 L/min, index 2.74   TTE 05/19/24:   IMPRESSIONS     1. Left ventricular ejection fraction, by estimation, is 60 to 65%. The  left ventricle has normal function. The left ventricle has  no regional  wall motion abnormalities. Left ventricular diastolic parameters were  normal.   2. Right ventricular systolic function is normal. The right ventricular  size is normal. Tricuspid regurgitation signal is inadequate for assessing  PA pressure.   3. Left atrial size was mildly dilated.   4. The mitral valve is normal in structure. No evidence of mitral valve  regurgitation. No evidence of mitral stenosis.   5. The aortic valve is normal in structure. Aortic valve regurgitation is  not visualized. No aortic stenosis is present.   6. The inferior vena cava is normal in size with greater than 50%  respiratory variability, suggesting right atrial pressure of 3 mmHg.   7. Cannot exclude a small PFO.    Patient Profile   Dupree Givler is a 68 y.o. male with a PMH of HTN, HLD, and DM2 who is being evaluated for chest pain found to have severe multivessel disease.   Assessment & Plan   #Severe Multivessel CAD :: Underwent LHC on 10/17 revealing multivessel disease not amenable to PCI.  Patient stable on heparin drip and aspirin.  CT surgery evaluated today and plans for CABG on Tuesday. - Consult CT surgery, appreciate recs - Continue heparin gtt - Continue aspirin 81 mg daily   #HLD - Continue Crestor  40 mg daily   #HTN - BP is stable - Continue lisinopril  2.5 mg daily - Continue amlodipine 10 mg daily        For questions or updates, please contact [facility] Please consult [email or web address] for contact info under       Signed, Georganna Archer, MD  05/21/2024, 12:58 PM

## 2024-05-21 NOTE — Progress Notes (Signed)
 PHARMACY - ANTICOAGULATION CONSULT NOTE  Pharmacy Consult for heparin Indication: chest pain/ACS, s/p cath, awaiting CVTS consult  No Known Allergies  Patient Measurements: Height: 5' 8 (172.7 cm) Weight: 80.3 kg (177 lb) IBW/kg (Calculated) : 68.4 HEPARIN DW (KG): 80.3  Vital Signs: Temp: 98.1 F (36.7 C) (10/18 2351) Temp Source: Oral (10/18 2351) BP: 119/75 (10/18 2351) Pulse Rate: 52 (10/18 2351)  Labs: Recent Labs    05/18/24 1638 05/19/24 0413 05/19/24 1559 05/19/24 1602 05/19/24 1603 05/20/24 0441 05/20/24 1359 05/20/24 2247  HGB 15.1 14.3   < > 14.6 14.3 14.6  --   --   HCT 44.1 41.3   < > 43.0 42.0 42.7  --   --   PLT 198 175  --   --   --  171  --   --   HEPARINUNFRC  --   --   --   --   --  0.34 <0.10* 0.57  CREATININE 1.15 1.40*  --   --   --  1.46*  --   --    < > = values in this interval not displayed.    Estimated Creatinine Clearance: 46.8 mL/min (A) (by C-G formula based on SCr of 1.46 mg/dL (H)).   Medical History: Past Medical History:  Diagnosis Date   Anxiety and depression    Diabetes mellitus without complication (HCC)    GERD (gastroesophageal reflux disease)    Hyperlipidemia    Melanoma (HCC) 2023   back      Assessment: 68 yo M s/p cath 10/17 found to have multivessel CAD pending TCTS consult. No anticoagulation prior to admission. Pharmacy consulted for heparin.    Received heparin 4000 units in cath at 16:05. TR band removed around 1900.   10/19 AM update:  Heparin level therapeutic   Goal of Therapy:  Heparin level 0.3-0.7 units/ml Monitor platelets by anticoagulation protocol: Yes   Plan:  Cont heparin 1450 units/hr Heparin level with AM labs F/U CVTS consult   Lynwood Mckusick, PharmD, BCPS Clinical Pharmacist Phone: [phone]

## 2024-05-21 NOTE — Progress Notes (Signed)
 PROGRESS NOTE  Matthew Patterson FMW:[id number] DOB: 12-Jul-1956 DOA: 05/17/2024 PCP: Merna Huxley, NP   LOS: 1 day   Brief narrative:  Matthew Patterson is a 68 y.o. male with past medical history significant for hyperlipidemia, diabetes mellitus type 2, history of melanoma, anxiety, depression, and GERD presented to the hospital with chest and back pain, shortness of breath, and dizziness symptoms going on for few weeks..  Symptoms most with physical exertion.  In the ED, patient had stable vitals except for mild bradycardia.  Patient had normal CBC BMP.  D-dimer was less than 0.2.  HIV was nonreactive.  Troponin was 15 followed by 19. TSH of 1.6.  EKG showed some inverted T waves in V1 and inferior leads.  Heart score was 7 on presentation.  Patient was then considered for further evaluation and treatment.   Assessment/Plan: Principal Problem:   Chest pain Active Problems:   Sinus bradycardia   Controlled type 2 diabetes mellitus without complication, without long-term current use of insulin (HCC)   Acute kidney injury superimposed on chronic kidney disease   Dyslipidemia   Arthritis   Cigar smoker  Chest pain secondary to multivessel coronary artery disease Cardiology was consulted for ongoing chest pain with abnormal EKG..  Holding beta-blocker due to bradycardia.  Continue aspirin Crestor . Discontinue amlodipine since patient stated that he does not use it.   Patient underwent cardiac catheterization on 05/19/2024 which showed severe multivessel coronary artery disease not amenable to PCI..  2D echocardiogram showed LV ejection fraction of 60 to 65% with no regional wall motion abnormality.  On heparin drip at this time.  Cardiothoracic surgery has seen the patient and has been considered good candidate for CABG.  Hypertension.  Not on beta-blockers due to bradycardia.  Does not wish to take amlodipine since he states that he does not take it at home.  Was on lisinopril  which has been  discontinued at this time.  Sinus bradycardia Asymptomatic.   Controlled diabetes mellitus type 2, without long-term use of insulin Last hemoglobin A1c reported to be 6.2.  Continue to hold metformin .  Continue Accu-Cheks, diabetic diet.   Acute kidney injury on chronic kidney disease Improved.  Latest creatinine 1.3 from initial creatinine of 1.5.  Check BMP in AM.   Hyperlipidemia Last LDL noted to be 71.  Continue Crestor  40 mg daily and fenofibrate    Arthritis Chronic NSAIDs at home.  Currently on hold.   Cigar smoker Smokes 5 cigars a day.  Reinforced on quitting.  DVT prophylaxis: Heparin drip   Disposition: Uncertain at this time.  Status is: Inpatient Remains inpatient appropriate because: Need for CABG    Code Status:     Code Status: Full Code  Family Communication: Spoke with the patient's spouse at bedside  Consultants: Cardiology Cardiothoracic surgery  Procedures: Cardiac catheterization  Anti-infectives:  None  Anti-infectives (From admission, onward)    None        Subjective: Today, patient was seen and examined at bedside.  Patient denies any chest pain, shortness of breath, dyspnea or palpitations.  Objective: Vitals:   05/21/24 0809 05/21/24 1252  BP: 105/73 110/69  Pulse:  (!) 57  Resp: 18 18  Temp: 98 F (36.7 C) 98 F (36.7 C)  SpO2: 95% 99%    Intake/Output Summary (Last 24 hours) at 05/21/2024 1509 Last data filed at 05/21/2024 0600 Gross per 24 hour  Intake 498.22 ml  Output --  Net 498.22 ml   [employer]  05/17/24 2239  Weight: 80.3 kg   Body mass index is 26.91 kg/m.   Physical Exam: GENERAL: Patient is alert awake and oriented. Not in obvious distress. HENT: No scleral pallor or icterus. Pupils equally reactive to light. Oral mucosa is moist NECK: is supple, no gross swelling noted. CHEST: Clear to auscultation. No crackles or wheezes.   CVS: S1 and S2 heard, no murmur. Regular rate and rhythm.   ABDOMEN: Soft, non-tender, bowel sounds are present. EXTREMITIES: No edema. CNS: Cranial nerves are intact. No focal motor deficits. SKIN: warm and dry without rashes.  Data Review: I have personally reviewed the following laboratory data and studies,  CBC: Recent Labs  Lab 05/17/24 2243 05/18/24 1638 05/19/24 0413 05/19/24 1559 05/19/24 1602 05/19/24 1603 05/20/24 0441 05/21/24 0427  WBC 4.7 5.1 5.7  --   --   --  6.5 6.1  NEUTROABS 3.5 3.5  --   --   --   --   --   --   HGB 13.5 15.1 14.3 14.3 14.6 14.3 14.6 14.6  HCT 39.4 44.1 41.3 42.0 43.0 42.0 42.7 41.9  MCV 92.9 92.6 92.8  --   --   --  92.6 92.3  PLT 176 198 175  --   --   --  171 188   Basic Metabolic Panel: Recent Labs  Lab 05/17/24 2243 05/18/24 1638 05/19/24 0413 05/19/24 1559 05/19/24 1602 05/19/24 1603 05/20/24 0441 05/21/24 0427  NA 140 141 138 142 140 140 137 138  K 4.1 3.8 3.8 3.3* 3.7 3.5 3.8 3.7  CL 106 107 108  --   --   --  106 107  CO2 22 26 22   --   --   --  21* 21*  GLUCOSE 214* 91 107*  --   --   --  134* 133*  BUN 28* 19 22  --   --   --  27* 27*  CREATININE 1.55* 1.15 1.40*  --   --   --  1.46* 1.36*  CALCIUM  9.0 8.9 8.7*  --   --   --  8.9 9.1   Liver Function Tests: Recent Labs  Lab 05/17/24 2243  AST 23  ALT 19  ALKPHOS 34*  BILITOT 0.4  PROT 6.5  ALBUMIN 4.2   No results for input(s): LIPASE, AMYLASE in the last 168 hours. No results for input(s): AMMONIA in the last 168 hours. Cardiac Enzymes: No results for input(s): CKTOTAL, CKMB, CKMBINDEX, TROPONINI in the last 168 hours. BNP (last 3 results) No results for input(s): BNP in the last 8760 hours.  ProBNP (last 3 results) No results for input(s): PROBNP in the last 8760 hours.  CBG: No results for input(s): GLUCAP in the last 168 hours. No results found for this or any previous visit (from the past 240 hours).   Studies: CARDIAC CATHETERIZATION Result Date: 05/19/2024   Ost RCA to Prox RCA  lesion is 100% stenosed.   Ost Cx to Prox Cx lesion is 90% stenosed.   1st Mrg lesion is 90% stenosed.   3rd Mrg lesion is 100% stenosed.   LPAV lesion is 100% stenosed with 100% stenosed side branch in LPDA.   Ost LAD lesion is 70% stenosed.   Prox LAD lesion is 90% stenosed.   Mid LAD lesion is 95% stenosed.   Mid LAD to Dist LAD lesion is 85% stenosed.   The left ventricular systolic function is normal.   LV end diastolic pressure is normal.  The left ventricular ejection fraction is 55-65% by visual estimate. Critical multivessel obstructive CAD Normal LV function Normal LV filling pressures. LVEDP 9 mm Hg, PCWP 11/9, mean 11 mm Hg Normal right heart pressures. PAP 30/10, mean 17 mm Hg Normal cardiac output. 5.31 L/min, index 2.74 Plan: CT surgery consult for CABG. Not suitable for PCI      Vernal Alstrom, MD  [facility] 05/21/2024  If 7PM-7AM, please contact night-coverage

## 2024-05-22 ENCOUNTER — Inpatient Hospital Stay (HOSPITAL_COMMUNITY)

## 2024-05-22 DIAGNOSIS — Z01810 Encounter for preprocedural cardiovascular examination: Secondary | ICD-10-CM | POA: Diagnosis not present

## 2024-05-22 DIAGNOSIS — R079 Chest pain, unspecified: Secondary | ICD-10-CM | POA: Diagnosis not present

## 2024-05-22 DIAGNOSIS — I25110 Atherosclerotic heart disease of native coronary artery with unstable angina pectoris: Secondary | ICD-10-CM | POA: Diagnosis not present

## 2024-05-22 LAB — CBC
HCT: 41.6 % (ref 39.0–52.0)
Hemoglobin: 14.6 g/dL (ref 13.0–17.0)
MCH: 31.9 pg (ref 26.0–34.0)
MCHC: 35.1 g/dL (ref 30.0–36.0)
MCV: 90.8 fL (ref 80.0–100.0)
Platelets: 196 K/uL (ref 150–400)
RBC: 4.58 MIL/uL (ref 4.22–5.81)
RDW: 12.1 % (ref 11.5–15.5)
WBC: 7.2 K/uL (ref 4.0–10.5)
nRBC: 0 % (ref 0.0–0.2)

## 2024-05-22 LAB — PULMONARY FUNCTION TEST
FEF 25-75 Pre: 3.2 L/s
FEF2575-%Pred-Pre: 133 %
FEV1-%Pred-Pre: 106 %
FEV1-Pre: 3.28 L
FEV1FVC-%Pred-Pre: 111 %
FEV6-%Pred-Pre: 101 %
FEV6-Pre: 3.99 L
FEV6FVC-%Pred-Pre: 105 %
FVC-%Pred-Pre: 95 %
FVC-Pre: 3.99 L
Pre FEV1/FVC ratio: 82 %
Pre FEV6/FVC Ratio: 100 %

## 2024-05-22 LAB — BASIC METABOLIC PANEL WITH GFR
Anion gap: 12 (ref 5–15)
BUN: 22 mg/dL (ref 8–23)
CO2: 19 mmol/L — ABNORMAL LOW (ref 22–32)
Calcium: 8.8 mg/dL — ABNORMAL LOW (ref 8.9–10.3)
Chloride: 106 mmol/L (ref 98–111)
Creatinine, Ser: 1.3 mg/dL — ABNORMAL HIGH (ref 0.61–1.24)
GFR, Estimated: 60 mL/min — ABNORMAL LOW (ref >60)
Glucose, Bld: 135 mg/dL — ABNORMAL HIGH (ref 70–99)
Potassium: 3.8 mmol/L (ref 3.5–5.1)
Sodium: 137 mmol/L (ref 135–145)

## 2024-05-22 LAB — APTT: aPTT: 124 s — ABNORMAL HIGH (ref 24–36)

## 2024-05-22 LAB — VAS US DOPPLER PRE CABG

## 2024-05-22 LAB — PROTIME-INR
INR: 1 (ref 0.8–1.2)
Prothrombin Time: 14.1 s (ref 11.4–15.2)

## 2024-05-22 LAB — HEMOGLOBIN A1C
Hgb A1c MFr Bld: 6.1 % — ABNORMAL HIGH (ref 4.8–5.6)
Mean Plasma Glucose: 128.37 mg/dL

## 2024-05-22 LAB — HEPARIN LEVEL (UNFRACTIONATED): Heparin Unfractionated: 0.65 [IU]/mL (ref 0.30–0.70)

## 2024-05-22 LAB — MAGNESIUM: Magnesium: 1.9 mg/dL (ref 1.7–2.4)

## 2024-05-22 LAB — PREPARE RBC (CROSSMATCH)

## 2024-05-22 LAB — LIPOPROTEIN A (LPA): Lipoprotein (a): 71.2 nmol/L — ABNORMAL HIGH (ref <75.0)

## 2024-05-22 MED ORDER — TRANEXAMIC ACID (OHS) BOLUS VIA INFUSION
15.0000 mg/kg | INTRAVENOUS | Status: AC
Start: 2024-05-23 — End: 2024-05-24
  Administered 2024-05-23: 1204.5 mg via INTRAVENOUS
  Filled 2024-05-22: qty 1205

## 2024-05-22 MED ORDER — MANNITOL 20 % IV SOLN
INTRAVENOUS | Status: DC
  Filled 2024-05-22: qty 13

## 2024-05-22 MED ORDER — NOREPINEPHRINE 4 MG/250ML-% IV SOLN
0.0000 ug/min | INTRAVENOUS | Status: DC
  Filled 2024-05-22: qty 250

## 2024-05-22 MED ORDER — TRANEXAMIC ACID (OHS) PUMP PRIME SOLUTION
2.0000 mg/kg | INTRAVENOUS | Status: DC
  Filled 2024-05-22: qty 1.61

## 2024-05-22 MED ORDER — HEPARIN 30,000 UNITS/1000 ML (OHS) CELLSAVER SOLUTION
Status: DC
  Filled 2024-05-22: qty 1000

## 2024-05-22 MED ORDER — VANCOMYCIN HCL 1250 MG/250ML IV SOLN
1250.0000 mg | INTRAVENOUS | Status: AC
  Administered 2024-05-23: 1250 mg via INTRAVENOUS
  Filled 2024-05-22: qty 250

## 2024-05-22 MED ORDER — POTASSIUM CHLORIDE 2 MEQ/ML IV SOLN
80.0000 meq | INTRAVENOUS | Status: DC
  Filled 2024-05-22: qty 40

## 2024-05-22 MED ORDER — MILRINONE LACTATE IN DEXTROSE 20-5 MG/100ML-% IV SOLN
0.3000 ug/kg/min | INTRAVENOUS | Status: DC
  Filled 2024-05-22: qty 100

## 2024-05-22 MED ORDER — CEFAZOLIN SODIUM-DEXTROSE 2-4 GM/100ML-% IV SOLN
2.0000 g | INTRAVENOUS | Status: AC
  Administered 2024-05-23: 2 g via INTRAVENOUS
  Filled 2024-05-22: qty 100

## 2024-05-22 MED ORDER — NITROGLYCERIN IN D5W 200-5 MCG/ML-% IV SOLN
2.0000 ug/min | INTRAVENOUS | Status: DC
Start: 2024-05-23 — End: 2024-05-24
  Filled 2024-05-22: qty 250

## 2024-05-22 MED ORDER — EPINEPHRINE HCL 5 MG/250ML IV SOLN IN NS
0.0000 ug/min | INTRAVENOUS | Status: DC
  Filled 2024-05-22: qty 250

## 2024-05-22 MED ORDER — TRANEXAMIC ACID 1000 MG/10ML IV SOLN
1.5000 mg/kg/h | INTRAVENOUS | Status: AC
  Administered 2024-05-23: 1.5 mg/kg/h via INTRAVENOUS
  Filled 2024-05-22: qty 25

## 2024-05-22 MED ORDER — PHENYLEPHRINE HCL-NACL 20-0.9 MG/250ML-% IV SOLN
30.0000 ug/min | INTRAVENOUS | Status: DC
Start: 2024-05-23 — End: 2024-05-24
  Filled 2024-05-22: qty 250

## 2024-05-22 MED ORDER — PLASMA-LYTE A IV SOLN
INTRAVENOUS | Status: DC
  Filled 2024-05-22: qty 2.5

## 2024-05-22 MED ORDER — CEFAZOLIN SODIUM-DEXTROSE 2-4 GM/100ML-% IV SOLN
2.0000 g | INTRAVENOUS | Status: AC
Start: 2024-05-23 — End: 2024-05-24
  Administered 2024-05-23: 2 g via INTRAVENOUS
  Filled 2024-05-22: qty 100

## 2024-05-22 MED ORDER — INSULIN REGULAR(HUMAN) IN NACL 100-0.9 UT/100ML-% IV SOLN
INTRAVENOUS | Status: AC
  Administered 2024-05-23: 2.4 [IU]/h via INTRAVENOUS
  Filled 2024-05-22: qty 100

## 2024-05-22 MED ORDER — DEXMEDETOMIDINE HCL IN NACL 400 MCG/100ML IV SOLN
0.1000 ug/kg/h | INTRAVENOUS | Status: AC
Start: 2024-05-23 — End: 2024-05-24
  Administered 2024-05-23: .4 ug/kg/h via INTRAVENOUS
  Filled 2024-05-22: qty 100

## 2024-05-22 NOTE — Anesthesia Preprocedure Evaluation (Signed)
 Anesthesia Evaluation  Patient identified by MRN, date of birth, ID band Patient awake    Reviewed: Allergy & Precautions, NPO status , Patient's Chart, lab work & pertinent test results  History of Anesthesia Complications Negative for: history of anesthetic complications  Airway Mallampati: II  TM Distance: >3 FB Neck ROM: Full    Dental  (+) Dental Advisory Given   Pulmonary Current Smoker and Patient abstained from smoking.   Pulmonary exam normal        Cardiovascular + CAD and + Peripheral Vascular Disease  Normal cardiovascular exam   '25 Carotid US  - 80-99% left ICAS, 1-39% right ICAS  '25 Cath - Ost RCA to Prox RCA lesion is 100% stenosed.   Ost Cx to Prox Cx lesion is 90% stenosed.   1st Mrg lesion is 90% stenosed.   3rd Mrg lesion is 100% stenosed.   LPAV lesion is 100% stenosed with 100% stenosed side branch in LPDA.   Ost LAD lesion is 70% stenosed.   Prox LAD lesion is 90% stenosed.   Mid LAD lesion is 95% stenosed.   Mid LAD to Dist LAD lesion is 85% stenosed.   The left ventricular systolic function is normal.   LV end diastolic pressure is normal.   The left ventricular ejection fraction is 55-65% by visual estimate.  '25 TTE - EF 60 to 65%. Left atrial size was mildly dilated. No significant valvular d/o identified. Cannot exclude a small PFO.       Neuro/Psych  PSYCHIATRIC DISORDERS Anxiety Depression    negative neurological ROS     GI/Hepatic Neg liver ROS,GERD  Controlled,,  Endo/Other  diabetes, Type 2, Oral Hypoglycemic Agents    Renal/GU CRFRenal disease     Musculoskeletal  (+) Arthritis ,    Abdominal   Peds  Hematology negative hematology ROS (+)   Anesthesia Other Findings   Reproductive/Obstetrics                              Anesthesia Physical Anesthesia Plan  ASA: 4  Anesthesia Plan: General   Post-op Pain Management:     Induction: Intravenous  PONV Risk Score and Plan: 1 and Treatment may vary due to age or medical condition  Airway Management Planned: Oral ETT  Additional Equipment: Arterial line, CVP, TEE and Ultrasound Guidance Line Placement  Intra-op Plan:   Post-operative Plan: Extubation in OR  Informed Consent: I have reviewed the patients History and Physical, chart, labs and discussed the procedure including the risks, benefits and alternatives for the proposed anesthesia with the patient or authorized representative who has indicated his/her understanding and acceptance.     Dental advisory given  Plan Discussed with: CRNA and Anesthesiologist  Anesthesia Plan Comments:          Anesthesia Quick Evaluation

## 2024-05-22 NOTE — Progress Notes (Addendum)
 VASCULAR LAB    Pre CABG Dopplers have been performed.  See CV proc for preliminary results.  Florence Pulling at [facility] and left VM regarding 80-99% left ICA stenosis and incidental finding of right radial AV fistula  Menucha Dicesare, RVT 05/22/2024, 2:19 PM

## 2024-05-22 NOTE — Progress Notes (Signed)
 Discussed with pt IS, sternal precautions, mobility post op and d/c planning. Pt receptive and already reading [facility] book. He moves well without using his arms. Performed 2500 ml IS. Wife will be with him at d/c. Now walking hall, knows to communicate with RN if he has angina. [id number] Aliene Aris BS, ACSM-CEP 05/22/2024 12:03 PM

## 2024-05-22 NOTE — Progress Notes (Signed)
  Progress Note  Patient Name: Matthew Patterson Date of Encounter: 05/22/2024 [facility] Cardiologist: Joelle VEAR Ren Donley, MD   Interval Summary   Resting comfortably in bed Doing well, no complaints Ready for CABG tomorrow   Vital Signs Vitals:   05/21/24 2116 05/22/24 0410 05/22/24 0748 05/22/24 0806  BP: 133/89 138/84 111/86   Pulse:      Resp: 16 15 16    Temp: 98.4 F (36.9 C) 98.3 F (36.8 C)  97.6 F (36.4 C)  TempSrc: Oral Oral  Oral  SpO2: 97% 95%  97%  Weight:      Height:        Intake/Output Summary (Last 24 hours) at 05/22/2024 0848 Last data filed at 05/22/2024 0758 Gross per 24 hour  Intake 404.27 ml  Output --  Net 404.27 ml      05/17/2024   10:39 PM 05/05/2024    7:04 AM 12/02/2023    7:55 AM  Last 3 Weights  Weight (lbs) 177 lb 179 lb 177 lb 6.4 oz  Weight (kg) 80.287 kg 81.194 kg 80.468 kg     Telemetry/ECG  Sinus brady, HR mid-50s - Personally Reviewed  Physical Exam  GEN: No acute distress.   Neck: No JVD Cardiac: RRR, no murmurs, rubs, or gallops.  Respiratory: Clear to auscultation bilaterally. GI: Soft, nontender, non-distended  MS: No edema  Assessment & Plan   Severe multivessel CAD LHC: severe multivessel CAD, not amendable to PCI Seen by CT surgery Denies any symptoms, doing well  Plan for CABG tomorrow 10/21 Continue IV heparin Continue ASA 81 mg daily  Hypertension Normotensive this admission, stable Continue lisinopril  2.5 mg daily Continue amlodipine 10 mg daily   Hyperlipidemia 05/05/2024: HDL 52.50; LDL Cholesterol 71 05/17/2024: ALT 19  Continue fenofibrate  160 mg daily Continue Crestor  40 mg daily    For questions or updates, please contact [facility] Please consult [email or web address] for contact info under         Signed, Waddell DELENA Donath, PA-C

## 2024-05-22 NOTE — Plan of Care (Signed)
  Problem: Education: Goal: Knowledge of General Education information will improve Description: Including pain rating scale, medication(s)/side effects and non-pharmacologic comfort measures Outcome: Progressing   Problem: Health Behavior/Discharge Planning: Goal: Ability to manage health-related needs will improve Outcome: Progressing   Problem: Clinical Measurements: Goal: Ability to maintain clinical measurements within normal limits will improve Outcome: Progressing Goal: Will remain free from infection Outcome: Progressing Goal: Diagnostic test results will improve Outcome: Progressing Goal: Respiratory complications will improve Outcome: Progressing Goal: Cardiovascular complication will be avoided Outcome: Progressing   Problem: Activity: Goal: Risk for activity intolerance will decrease Outcome: Progressing   Problem: Nutrition: Goal: Adequate nutrition will be maintained Outcome: Progressing   Problem: Coping: Goal: Level of anxiety will decrease Outcome: Progressing   Problem: Elimination: Goal: Will not experience complications related to bowel motility Outcome: Progressing Goal: Will not experience complications related to urinary retention Outcome: Progressing   Problem: Pain Managment: Goal: General experience of comfort will improve and/or be controlled Outcome: Progressing   Problem: Safety: Goal: Ability to remain free from injury will improve Outcome: Progressing   Problem: Skin Integrity: Goal: Risk for impaired skin integrity will decrease Outcome: Progressing   Problem: Education: Goal: Understanding of CV disease, CV risk reduction, and recovery process will improve Outcome: Progressing Goal: Individualized Educational Video(s) Outcome: Progressing   Problem: Activity: Goal: Ability to return to baseline activity level will improve Outcome: Progressing   Problem: Cardiovascular: Goal: Ability to achieve and maintain adequate  cardiovascular perfusion will improve Outcome: Progressing Goal: Vascular access site(s) Level 0-1 will be maintained Outcome: Progressing   Problem: Health Behavior/Discharge Planning: Goal: Ability to safely manage health-related needs after discharge will improve Outcome: Progressing   Problem: Education: Goal: Understanding of CV disease, CV risk reduction, and recovery process will improve Outcome: Progressing Goal: Individualized Educational Video(s) Outcome: Progressing   Problem: Activity: Goal: Ability to return to baseline activity level will improve Outcome: Progressing   Problem: Cardiovascular: Goal: Ability to achieve and maintain adequate cardiovascular perfusion will improve Outcome: Progressing Goal: Vascular access site(s) Level 0-1 will be maintained Outcome: Progressing   Problem: Health Behavior/Discharge Planning: Goal: Ability to safely manage health-related needs after discharge will improve Outcome: Progressing

## 2024-05-22 NOTE — TOC Initial Note (Signed)
 Transition of Care [facility]) - Initial/Assessment Note    Patient Details  Name: Matthew Patterson MRN: [id number] Date of Birth: [birth year]  Transition of Care [facility] CM/SW Contact:    Sudie Erminio Deems, RN Phone Number: 05/22/2024, 3:29 PM  Clinical Narrative: Patient presented for chest pain-plan for CABG 05-23-24. PTA patient was independent from home with spouse. Patient states he does not use any DME in the home. Patient has insurance and PCP. Inpatient Case Manager will continue to follow for disposition needs as the patient progresses.    Expected Discharge Plan: Home w Home Health Services Barriers to Discharge: Continued Medical Work up   Patient Goals and CMS Choice Patient states their goals for this hospitalization and ongoing recovery are:: Plans to return home once stable.    Expected Discharge Plan and Services In-house Referral: NA Discharge Planning Services: CM Consult Post Acute Care Choice: NA Living arrangements for the past 2 months: Single Family Home                   DME Agency: NA   Prior Living Arrangements/Services Living arrangements for the past 2 months: Single Family Home Lives with:: Spouse Patient language and need for interpreter reviewed:: Yes Do you feel safe going back to the place where you live?: Yes      Need for Family Participation in Patient Care: Yes (Comment) Care giver support system in place?: Yes (comment)   Criminal Activity/Legal Involvement Pertinent to Current Situation/Hospitalization: No - Comment as needed  Activities of Daily Living   ADL Screening (condition at time of admission) Independently performs ADLs?: Yes (appropriate for developmental age) Is the patient deaf or have difficulty hearing?: No Does the patient have difficulty seeing, even when wearing glasses/contacts?: No Does the patient have difficulty concentrating, remembering, or making decisions?: No  Permission Sought/Granted Permission  sought to share information with : Family Supports, Case Manager                Emotional Assessment Appearance:: Appears stated age Attitude/Demeanor/Rapport: Engaged Affect (typically observed): Appropriate Orientation: : Oriented to Self, Oriented to Place, Oriented to  Time, Oriented to Situation Alcohol / Substance Use: Not Applicable Psych Involvement: No (comment)  Admission diagnosis:  Chest pain [R07.9] Chest pain, unspecified type [R07.9] Patient Active Problem List   Diagnosis Date Noted   Sinus bradycardia 05/18/2024   Acute kidney injury superimposed on chronic kidney disease 05/18/2024   Arthritis 05/18/2024   Cigar smoker 05/18/2024   Chest pain 05/17/2024   Type 2 diabetes mellitus in remission 05/21/2022   Controlled type 2 diabetes mellitus without complication, without long-term current use of insulin (HCC) 10/28/2021   Dyslipidemia 10/28/2021   Mixed hyperlipidemia 09/16/2021   Controlled diabetes mellitus type 2 with complications (HCC) 04/16/2017   Anxiety and depression 09/15/2016   Insomnia 09/15/2016   Impaired glucose tolerance 02/25/2015   Degeneration of cervical intervertebral disc 01/29/2014   Chest tightness 11/15/2012   Reflux esophagitis 02/11/2012   Routine general medical examination at a health care facility 02/11/2012   Left ankle pain 08/20/2011   OTHER ACUTE REACTIONS TO STRESS 02/10/2008   HYPERTRIGLYCERIDEMIA 10/27/2007   PCP:  Merna Huxley, NP Pharmacy:   [facility] [id number] - [place], [place] - [address] [place] [address] Phone: [phone] Fax: [phone]     Social Drivers of Health (SDOH) Social History: SDOH Screenings   Food Insecurity: No Food Insecurity (05/18/2024)  Housing: Low Risk  (05/18/2024)  Transportation Needs: No Transportation Needs (05/18/2024)  Utilities: Not At Risk (05/18/2024)  Alcohol Screen: Low Risk  (08/10/2023)  Depression (PHQ2-9): Low  Risk  (05/05/2024)  Financial Resource Strain: Low Risk  (08/10/2023)  Physical Activity: Insufficiently Active (08/10/2023)  Social Connections: Socially Integrated (05/18/2024)  Stress: No Stress Concern Present (08/10/2023)  Tobacco Use: High Risk (05/17/2024)   SDOH Interventions:     Readmission Risk Interventions     No data to display

## 2024-05-22 NOTE — Progress Notes (Signed)
 PROGRESS NOTE  Matthew Patterson FMW:[id number] DOB: November 27, 1955 DOA: 05/17/2024 PCP: Merna Huxley, NP   LOS: 2 days   Brief narrative:  Matthew Patterson is a 68 y.o. male with past medical history significant for hyperlipidemia, diabetes mellitus type 2, history of melanoma, anxiety, depression, and GERD presented to the hospital with chest and back pain, shortness of breath, and dizziness symptoms going on for few weeks..  Symptoms most with physical exertion.  In the ED, patient had stable vitals except for mild bradycardia.  Patient had normal CBC BMP.  D-dimer was less than 0.2.  HIV was nonreactive.  Troponin was 15 followed by 19. TSH of 1.6.  EKG showed some inverted T waves in V1 and inferior leads.  Heart score was 7 on presentation.  Patient was then considered for further evaluation and treatment.   Assessment/Plan: Principal Problem:   Chest pain Active Problems:   Sinus bradycardia   Controlled type 2 diabetes mellitus without complication, without long-term current use of insulin (HCC)   Acute kidney injury superimposed on chronic kidney disease   Dyslipidemia   Arthritis   Cigar smoker  Chest pain secondary to multivessel coronary artery disease Cardiology was consulted for ongoing chest pain with abnormal EKG..  Holding beta-blocker due to bradycardia.  Continue aspirin Crestor .  Off amlodipine.   Patient underwent cardiac catheterization on 05/19/2024 which showed severe multivessel coronary artery disease not amenable to PCI.  2D echocardiogram showed LV ejection fraction of 60 to 65% with no regional wall motion abnormality.  On heparin drip at this time.  Cardiothoracic surgery has seen the patient and has been considered good candidate for CABG. Plan for CABG 05/23/2024  Hypertension.  Not on beta-blockers due to bradycardia.  Off amlodipine and lisinopril  at this time  Sinus bradycardia Asymptomatic.  Has improved   Controlled diabetes mellitus type 2, without  long-term use of insulin Last hemoglobin A1c reported to be 6.2.  Continue to hold metformin .  Continue Accu-Cheks, diabetic diet.   Acute kidney injury on chronic kidney disease Improved.  Latest creatinine 1.3 from initial creatinine of 1.5.  Check BMP in AM.   Hyperlipidemia Last LDL noted to be 71.  Continue Crestor  and fenofibrate    Arthritis Chronic NSAIDs at home.  Currently on hold.   Cigar smoker Smokes 5 cigars a day.  Reinforced on quitting.  DVT prophylaxis: Heparin drip   Disposition: Uncertain at this time.  Status is: Inpatient Remains inpatient appropriate because: Need for CABG    Code Status:     Code Status: Full Code  Family Communication: Spoke with the patient's spouse at bedside 05/21/2024  Consultants: Cardiology Cardiothoracic surgery  Procedures: Cardiac catheterization  Anti-infectives:  None  Anti-infectives (From admission, onward)    Start     Dose/Rate Route Frequency Ordered Stop   05/23/24 0400  vancomycin (VANCOREADY) IVPB 1250 mg/250 mL        1,250 mg 166.7 mL/hr over 90 Minutes Intravenous To Surgery 05/22/24 1007 05/24/24 0400   05/23/24 0400  ceFAZolin (ANCEF) IVPB 2g/100 mL premix        2 g 200 mL/hr over 30 Minutes Intravenous To Surgery 05/22/24 1007 05/24/24 0400   05/23/24 0400  ceFAZolin (ANCEF) IVPB 2g/100 mL premix        2 g 200 mL/hr over 30 Minutes Intravenous To Surgery 05/22/24 1007 05/24/24 0400        Subjective: Today, patient was seen and examined at bedside.  Patient denies any shortness  of breath, dyspnea, chest pain, palpitation nausea vomiting.  Awaiting for CABG  Objective: Vitals:   05/22/24 0748 05/22/24 0806  BP: 111/86   Pulse:    Resp: 16   Temp:  97.6 F (36.4 C)  SpO2:  97%    Intake/Output Summary (Last 24 hours) at 05/22/2024 1052 Last data filed at 05/22/2024 0758 Gross per 24 hour  Intake 404.27 ml  Output --  Net 404.27 ml   Filed Weights   05/17/24 2239  Weight:  80.3 kg   Body mass index is 26.91 kg/m.   Physical Exam:  General:  Average built, not in obvious distress HENT:   No scleral pallor or icterus noted. Oral mucosa is moist.  Chest:  Clear breath sounds.  Diminished breath sounds bilaterally. No crackles or wheezes.  CVS: S1 &S2 heard. No murmur.  Regular rate and rhythm. Abdomen: Soft, nontender, nondistended.  Bowel sounds are heard.   Extremities: No cyanosis, clubbing or edema.  Peripheral pulses are palpable. Psych: Alert, awake and oriented, normal mood CNS:  No cranial nerve deficits.  Power equal in all extremities.   Skin: Warm and dry.  No rashes noted.  Data Review: I have personally reviewed the following laboratory data and studies,  CBC: Recent Labs  Lab 05/17/24 2243 05/18/24 1638 05/19/24 0413 05/19/24 1559 05/19/24 1602 05/19/24 1603 05/20/24 0441 05/21/24 0427 05/22/24 0410  WBC 4.7 5.1 5.7  --   --   --  6.5 6.1 7.2  NEUTROABS 3.5 3.5  --   --   --   --   --   --   --   HGB 13.5 15.1 14.3   < > 14.6 14.3 14.6 14.6 14.6  HCT 39.4 44.1 41.3   < > 43.0 42.0 42.7 41.9 41.6  MCV 92.9 92.6 92.8  --   --   --  92.6 92.3 90.8  PLT 176 198 175  --   --   --  171 188 196   < > = values in this interval not displayed.   Basic Metabolic Panel: Recent Labs  Lab 05/18/24 1638 05/19/24 0413 05/19/24 1559 05/19/24 1602 05/19/24 1603 05/20/24 0441 05/21/24 0427 05/22/24 0410  NA 141 138   < > 140 140 137 138 137  K 3.8 3.8   < > 3.7 3.5 3.8 3.7 3.8  CL 107 108  --   --   --  106 107 106  CO2 26 22  --   --   --  21* 21* 19*  GLUCOSE 91 107*  --   --   --  134* 133* 135*  BUN 19 22  --   --   --  27* 27* 22  CREATININE 1.15 1.40*  --   --   --  1.46* 1.36* 1.30*  CALCIUM  8.9 8.7*  --   --   --  8.9 9.1 8.8*  MG  --   --   --   --   --   --   --  1.9   < > = values in this interval not displayed.   Liver Function Tests: Recent Labs  Lab 05/17/24 2243  AST 23  ALT 19  ALKPHOS 34*  BILITOT 0.4  PROT  6.5  ALBUMIN 4.2   No results for input(s): LIPASE, AMYLASE in the last 168 hours. No results for input(s): AMMONIA in the last 168 hours. Cardiac Enzymes: No results for input(s): CKTOTAL, CKMB, CKMBINDEX, TROPONINI in the  last 168 hours. BNP (last 3 results) No results for input(s): BNP in the last 8760 hours.  ProBNP (last 3 results) No results for input(s): PROBNP in the last 8760 hours.  CBG: No results for input(s): GLUCAP in the last 168 hours. Recent Results (from the past 240 hours)  Surgical pcr screen     Status: None   Collection Time: 05/21/24  8:43 PM   Specimen: Nasal Mucosa; Nasal Swab  Result Value Ref Range Status   MRSA, PCR NEGATIVE NEGATIVE Final   Staphylococcus aureus NEGATIVE NEGATIVE Final    Comment: (NOTE) The Xpert SA Assay (FDA approved for NASAL specimens in patients 88 years of age and older), is one component of a comprehensive surveillance program. It is not intended to diagnose infection nor to guide or monitor treatment. Performed at [facility], [address] [place], [place] [address]      Studies: No results found.     Faizon Capozzi, MD  [facility] 05/22/2024  If 7PM-7AM, please contact night-coverage

## 2024-05-22 NOTE — Progress Notes (Signed)
 PHARMACY - ANTICOAGULATION CONSULT NOTE  Pharmacy Consult for heparin Indication: chest pain/ACS, s/p cath, awaiting CVTS consult  No Known Allergies  Patient Measurements: Height: 5' 8 (172.7 cm) Weight: 80.3 kg (177 lb) IBW/kg (Calculated) : 68.4 HEPARIN DW (KG): 80.3  Vital Signs: Temp: 98.3 F (36.8 C) (10/20 0410) Temp Source: Oral (10/20 0410) BP: 138/84 (10/20 0410)  Labs: Recent Labs    05/20/24 0441 05/20/24 1359 05/21/24 0427 05/21/24 1409 05/22/24 0410  HGB 14.6  --  14.6  --  14.6  HCT 42.7  --  41.9  --  41.6  PLT 171  --  188  --  196  APTT  --   --   --   --  124*  LABPROT  --   --   --   --  14.1  INR  --   --   --   --  1.0  HEPARINUNFRC 0.34   < > 0.71* 0.66 0.65  CREATININE 1.46*  --  1.36*  --  1.30*   < > = values in this interval not displayed.    Estimated Creatinine Clearance: 52.6 mL/min (A) (by C-G formula based on SCr of 1.3 mg/dL (H)).   Medical History: Past Medical History:  Diagnosis Date   Anxiety and depression    Diabetes mellitus without complication (HCC)    GERD (gastroesophageal reflux disease)    Hyperlipidemia    Melanoma (HCC) 2023   back      Assessment: 68 yo M s/p cath 10/17 found to have multivessel CAD pending TCTS consult. No anticoagulation prior to admission. Pharmacy consulted for heparin.    Heparin level therapeutic 0.65, CBC stable, CABG tomorrow.  Goal of Therapy:  Heparin level 0.3-0.7 units/ml Monitor platelets by anticoagulation protocol: Yes   Plan:  Continue heparin 1400 units/h Daily heparin level, CBC  Ozell Jamaica, PharmD, [place], [facility] Clinical Pharmacist [phone] Please check AMION for all [facility] Pharmacy numbers 05/22/2024

## 2024-05-23 ENCOUNTER — Inpatient Hospital Stay (HOSPITAL_COMMUNITY): Payer: Self-pay | Admitting: Anesthesiology

## 2024-05-23 ENCOUNTER — Inpatient Hospital Stay (HOSPITAL_COMMUNITY)

## 2024-05-23 ENCOUNTER — Inpatient Hospital Stay (HOSPITAL_COMMUNITY)
Admission: EM | Disposition: A | Discharge status: Skilled Nursing Facility | Payer: Self-pay | Source: Home / Self Care | Attending: Thoracic Surgery (Cardiothoracic Vascular Surgery)

## 2024-05-23 ENCOUNTER — Encounter (HOSPITAL_COMMUNITY): Payer: Self-pay | Admitting: Hospitalist

## 2024-05-23 DIAGNOSIS — Z951 Presence of aortocoronary bypass graft: Secondary | ICD-10-CM | POA: Diagnosis not present

## 2024-05-23 DIAGNOSIS — I2510 Atherosclerotic heart disease of native coronary artery without angina pectoris: Secondary | ICD-10-CM | POA: Diagnosis not present

## 2024-05-23 DIAGNOSIS — Z9911 Dependence on respirator [ventilator] status: Secondary | ICD-10-CM

## 2024-05-23 DIAGNOSIS — I10 Essential (primary) hypertension: Secondary | ICD-10-CM | POA: Diagnosis not present

## 2024-05-23 DIAGNOSIS — E119 Type 2 diabetes mellitus without complications: Secondary | ICD-10-CM | POA: Diagnosis not present

## 2024-05-23 DIAGNOSIS — I2584 Coronary atherosclerosis due to calcified coronary lesion: Secondary | ICD-10-CM | POA: Diagnosis not present

## 2024-05-23 DIAGNOSIS — R079 Chest pain, unspecified: Secondary | ICD-10-CM | POA: Diagnosis not present

## 2024-05-23 HISTORY — PX: CORONARY ARTERY BYPASS GRAFT: SHX141

## 2024-05-23 LAB — POCT I-STAT 7, (LYTES, BLD GAS, ICA,H+H)
Acid-base deficit: 4 mmol/L — ABNORMAL HIGH (ref 0.0–2.0)
Acid-base deficit: 3 mmol/L — ABNORMAL HIGH (ref 0.0–2.0)
Acid-base deficit: 2 mmol/L (ref 0.0–2.0)
Acid-base deficit: 3 mmol/L — ABNORMAL HIGH (ref 0.0–2.0)
Acid-base deficit: 5 mmol/L — ABNORMAL HIGH (ref 0.0–2.0)
Acid-base deficit: 7 mmol/L — ABNORMAL HIGH (ref 0.0–2.0)
Acid-base deficit: 6 mmol/L — ABNORMAL HIGH (ref 0.0–2.0)
Acid-base deficit: 6 mmol/L — ABNORMAL HIGH (ref 0.0–2.0)
Bicarbonate: 22 mmol/L (ref 20.0–28.0)
Bicarbonate: 21.5 mmol/L (ref 20.0–28.0)
Bicarbonate: 20.9 mmol/L (ref 20.0–28.0)
Bicarbonate: 23.1 mmol/L (ref 20.0–28.0)
Bicarbonate: 19.3 mmol/L — ABNORMAL LOW (ref 20.0–28.0)
Bicarbonate: 19 mmol/L — ABNORMAL LOW (ref 20.0–28.0)
Bicarbonate: 19.5 mmol/L — ABNORMAL LOW (ref 20.0–28.0)
Bicarbonate: 19.3 mmol/L — ABNORMAL LOW (ref 20.0–28.0)
Calcium, Ion: 1.23 mmol/L (ref 1.15–1.40)
Calcium, Ion: 0.9 mmol/L — ABNORMAL LOW (ref 1.15–1.40)
Calcium, Ion: 1 mmol/L — ABNORMAL LOW (ref 1.15–1.40)
Calcium, Ion: 1.12 mmol/L — ABNORMAL LOW (ref 1.15–1.40)
Calcium, Ion: 1.08 mmol/L — ABNORMAL LOW (ref 1.15–1.40)
Calcium, Ion: 1.07 mmol/L — ABNORMAL LOW (ref 1.15–1.40)
Calcium, Ion: 1.09 mmol/L — ABNORMAL LOW (ref 1.15–1.40)
Calcium, Ion: 1.08 mmol/L — ABNORMAL LOW (ref 1.15–1.40)
HCT: 40 % (ref 39.0–52.0)
HCT: 26 % — ABNORMAL LOW (ref 39.0–52.0)
HCT: 27 % — ABNORMAL LOW (ref 39.0–52.0)
HCT: 28 % — ABNORMAL LOW (ref 39.0–52.0)
HCT: 31 % — ABNORMAL LOW (ref 39.0–52.0)
HCT: 27 % — ABNORMAL LOW (ref 39.0–52.0)
HCT: 31 % — ABNORMAL LOW (ref 39.0–52.0)
HCT: 29 % — ABNORMAL LOW (ref 39.0–52.0)
Hemoglobin: 13.6 g/dL (ref 13.0–17.0)
Hemoglobin: 8.8 g/dL — ABNORMAL LOW (ref 13.0–17.0)
Hemoglobin: 9.2 g/dL — ABNORMAL LOW (ref 13.0–17.0)
Hemoglobin: 9.5 g/dL — ABNORMAL LOW (ref 13.0–17.0)
Hemoglobin: 10.5 g/dL — ABNORMAL LOW (ref 13.0–17.0)
Hemoglobin: 9.2 g/dL — ABNORMAL LOW (ref 13.0–17.0)
Hemoglobin: 10.5 g/dL — ABNORMAL LOW (ref 13.0–17.0)
Hemoglobin: 9.9 g/dL — ABNORMAL LOW (ref 13.0–17.0)
O2 Saturation: 100 %
O2 Saturation: 100 %
O2 Saturation: 100 %
O2 Saturation: 99 %
O2 Saturation: 97 %
O2 Saturation: 98 %
O2 Saturation: 99 %
O2 Saturation: 97 %
Patient temperature: 35.8
Patient temperature: 35.6
Patient temperature: 37.4
Patient temperature: 37.7
Potassium: 4 mmol/L (ref 3.5–5.1)
Potassium: 4.5 mmol/L (ref 3.5–5.1)
Potassium: 4.6 mmol/L (ref 3.5–5.1)
Potassium: 5.4 mmol/L — ABNORMAL HIGH (ref 3.5–5.1)
Potassium: 3.8 mmol/L (ref 3.5–5.1)
Potassium: 3.5 mmol/L (ref 3.5–5.1)
Potassium: 4.2 mmol/L (ref 3.5–5.1)
Potassium: 4.3 mmol/L (ref 3.5–5.1)
Sodium: 139 mmol/L (ref 135–145)
Sodium: 139 mmol/L (ref 135–145)
Sodium: 139 mmol/L (ref 135–145)
Sodium: 140 mmol/L (ref 135–145)
Sodium: 142 mmol/L (ref 135–145)
Sodium: 144 mmol/L (ref 135–145)
Sodium: 142 mmol/L (ref 135–145)
Sodium: 142 mmol/L (ref 135–145)
TCO2: 23 mmol/L (ref 22–32)
TCO2: 23 mmol/L (ref 22–32)
TCO2: 22 mmol/L (ref 22–32)
TCO2: 24 mmol/L (ref 22–32)
TCO2: 20 mmol/L — ABNORMAL LOW (ref 22–32)
TCO2: 20 mmol/L — ABNORMAL LOW (ref 22–32)
TCO2: 21 mmol/L — ABNORMAL LOW (ref 22–32)
TCO2: 20 mmol/L — ABNORMAL LOW (ref 22–32)
pCO2 arterial: 41.1 mmHg (ref 32–48)
pCO2 arterial: 34.6 mmHg (ref 32–48)
pCO2 arterial: 33.4 mmHg (ref 32–48)
pCO2 arterial: 38.8 mmHg (ref 32–48)
pCO2 arterial: 29.8 mmHg — ABNORMAL LOW (ref 32–48)
pCO2 arterial: 35.4 mmHg (ref 32–48)
pCO2 arterial: 38.9 mmHg (ref 32–48)
pCO2 arterial: 38.7 mmHg (ref 32–48)
pH, Arterial: 7.336 — ABNORMAL LOW (ref 7.35–7.45)
pH, Arterial: 7.401 (ref 7.35–7.45)
pH, Arterial: 7.382 (ref 7.35–7.45)
pH, Arterial: 7.404 (ref 7.35–7.45)
pH, Arterial: 7.414 (ref 7.35–7.45)
pH, Arterial: 7.33 — ABNORMAL LOW (ref 7.35–7.45)
pH, Arterial: 7.31 — ABNORMAL LOW (ref 7.35–7.45)
pH, Arterial: 7.308 — ABNORMAL LOW (ref 7.35–7.45)
pO2, Arterial: 431 mmHg — ABNORMAL HIGH (ref 83–108)
pO2, Arterial: 391 mmHg — ABNORMAL HIGH (ref 83–108)
pO2, Arterial: 128 mmHg — ABNORMAL HIGH (ref 83–108)
pO2, Arterial: 238 mmHg — ABNORMAL HIGH (ref 83–108)
pO2, Arterial: 83 mmHg (ref 83–108)
pO2, Arterial: 104 mmHg (ref 83–108)
pO2, Arterial: 128 mmHg — ABNORMAL HIGH (ref 83–108)
pO2, Arterial: 98 mmHg (ref 83–108)

## 2024-05-23 LAB — POCT I-STAT, CHEM 8
BUN: 22 mg/dL (ref 8–23)
BUN: 21 mg/dL (ref 8–23)
BUN: 18 mg/dL (ref 8–23)
BUN: 19 mg/dL (ref 8–23)
Calcium, Ion: 1.24 mmol/L (ref 1.15–1.40)
Calcium, Ion: 1.22 mmol/L (ref 1.15–1.40)
Calcium, Ion: 0.94 mmol/L — ABNORMAL LOW (ref 1.15–1.40)
Calcium, Ion: 1.11 mmol/L — ABNORMAL LOW (ref 1.15–1.40)
Chloride: 105 mmol/L (ref 98–111)
Chloride: 106 mmol/L (ref 98–111)
Chloride: 103 mmol/L (ref 98–111)
Chloride: 106 mmol/L (ref 98–111)
Creatinine, Ser: 1.2 mg/dL (ref 0.61–1.24)
Creatinine, Ser: 1.1 mg/dL (ref 0.61–1.24)
Creatinine, Ser: 1 mg/dL (ref 0.61–1.24)
Creatinine, Ser: 1 mg/dL (ref 0.61–1.24)
Glucose, Bld: 132 mg/dL — ABNORMAL HIGH (ref 70–99)
Glucose, Bld: 132 mg/dL — ABNORMAL HIGH (ref 70–99)
Glucose, Bld: 95 mg/dL (ref 70–99)
Glucose, Bld: 111 mg/dL — ABNORMAL HIGH (ref 70–99)
HCT: 41 % (ref 39.0–52.0)
HCT: 38 % — ABNORMAL LOW (ref 39.0–52.0)
HCT: 27 % — ABNORMAL LOW (ref 39.0–52.0)
HCT: 27 % — ABNORMAL LOW (ref 39.0–52.0)
Hemoglobin: 13.9 g/dL (ref 13.0–17.0)
Hemoglobin: 12.9 g/dL — ABNORMAL LOW (ref 13.0–17.0)
Hemoglobin: 9.2 g/dL — ABNORMAL LOW (ref 13.0–17.0)
Hemoglobin: 9.2 g/dL — ABNORMAL LOW (ref 13.0–17.0)
Potassium: 4.1 mmol/L (ref 3.5–5.1)
Potassium: 4.1 mmol/L (ref 3.5–5.1)
Potassium: 4.8 mmol/L (ref 3.5–5.1)
Potassium: 4.7 mmol/L (ref 3.5–5.1)
Sodium: 141 mmol/L (ref 135–145)
Sodium: 140 mmol/L (ref 135–145)
Sodium: 140 mmol/L (ref 135–145)
Sodium: 140 mmol/L (ref 135–145)
TCO2: 24 mmol/L (ref 22–32)
TCO2: 21 mmol/L — ABNORMAL LOW (ref 22–32)
TCO2: 23 mmol/L (ref 22–32)
TCO2: 21 mmol/L — ABNORMAL LOW (ref 22–32)

## 2024-05-23 LAB — GLUCOSE, CAPILLARY
Glucose-Capillary: 149 mg/dL — ABNORMAL HIGH (ref 70–99)
Glucose-Capillary: 119 mg/dL — ABNORMAL HIGH (ref 70–99)
Glucose-Capillary: 118 mg/dL — ABNORMAL HIGH (ref 70–99)
Glucose-Capillary: 94 mg/dL (ref 70–99)
Glucose-Capillary: 96 mg/dL (ref 70–99)
Glucose-Capillary: 104 mg/dL — ABNORMAL HIGH (ref 70–99)
Glucose-Capillary: 138 mg/dL — ABNORMAL HIGH (ref 70–99)
Glucose-Capillary: 147 mg/dL — ABNORMAL HIGH (ref 70–99)
Glucose-Capillary: 125 mg/dL — ABNORMAL HIGH (ref 70–99)
Glucose-Capillary: 125 mg/dL — ABNORMAL HIGH (ref 70–99)

## 2024-05-23 LAB — CBC
HCT: 43.8 % (ref 39.0–52.0)
HCT: 33.2 % — ABNORMAL LOW (ref 39.0–52.0)
HCT: 33.7 % — ABNORMAL LOW (ref 39.0–52.0)
Hemoglobin: 15.2 g/dL (ref 13.0–17.0)
Hemoglobin: 11.6 g/dL — ABNORMAL LOW (ref 13.0–17.0)
Hemoglobin: 11.5 g/dL — ABNORMAL LOW (ref 13.0–17.0)
MCH: 31.9 pg (ref 26.0–34.0)
MCH: 32.3 pg (ref 26.0–34.0)
MCH: 31.9 pg (ref 26.0–34.0)
MCHC: 34.7 g/dL (ref 30.0–36.0)
MCHC: 34.9 g/dL (ref 30.0–36.0)
MCHC: 34.1 g/dL (ref 30.0–36.0)
MCV: 92 fL (ref 80.0–100.0)
MCV: 92.5 fL (ref 80.0–100.0)
MCV: 93.6 fL (ref 80.0–100.0)
Platelets: 195 K/uL (ref 150–400)
Platelets: 133 K/uL — ABNORMAL LOW (ref 150–400)
Platelets: 156 K/uL (ref 150–400)
RBC: 4.76 MIL/uL (ref 4.22–5.81)
RBC: 3.59 MIL/uL — ABNORMAL LOW (ref 4.22–5.81)
RBC: 3.6 MIL/uL — ABNORMAL LOW (ref 4.22–5.81)
RDW: 12.1 % (ref 11.5–15.5)
RDW: 12.2 % (ref 11.5–15.5)
RDW: 12.5 % (ref 11.5–15.5)
WBC: 6.3 K/uL (ref 4.0–10.5)
WBC: 7.6 K/uL (ref 4.0–10.5)
WBC: 8.9 K/uL (ref 4.0–10.5)
nRBC: 0 % (ref 0.0–0.2)
nRBC: 0 % (ref 0.0–0.2)
nRBC: 0 % (ref 0.0–0.2)

## 2024-05-23 LAB — MAGNESIUM: Magnesium: 3.1 mg/dL — ABNORMAL HIGH (ref 1.7–2.4)

## 2024-05-23 LAB — BASIC METABOLIC PANEL WITH GFR
Anion gap: 10 (ref 5–15)
Anion gap: 8 (ref 5–15)
BUN: 22 mg/dL (ref 8–23)
BUN: 14 mg/dL (ref 8–23)
CO2: 21 mmol/L — ABNORMAL LOW (ref 22–32)
CO2: 21 mmol/L — ABNORMAL LOW (ref 22–32)
Calcium: 9 mg/dL (ref 8.9–10.3)
Calcium: 7.7 mg/dL — ABNORMAL LOW (ref 8.9–10.3)
Chloride: 107 mmol/L (ref 98–111)
Chloride: 110 mmol/L (ref 98–111)
Creatinine, Ser: 1.29 mg/dL — ABNORMAL HIGH (ref 0.61–1.24)
Creatinine, Ser: 1.17 mg/dL (ref 0.61–1.24)
GFR, Estimated: >60 mL/min (ref >60)
GFR, Estimated: >60 mL/min (ref >60)
Glucose, Bld: 120 mg/dL — ABNORMAL HIGH (ref 70–99)
Glucose, Bld: 119 mg/dL — ABNORMAL HIGH (ref 70–99)
Potassium: 3.6 mmol/L (ref 3.5–5.1)
Potassium: 4.7 mmol/L (ref 3.5–5.1)
Sodium: 138 mmol/L (ref 135–145)
Sodium: 139 mmol/L (ref 135–145)

## 2024-05-23 LAB — POCT I-STAT EG7
Acid-base deficit: 3 mmol/L — ABNORMAL HIGH (ref 0.0–2.0)
Bicarbonate: 22.4 mmol/L (ref 20.0–28.0)
Calcium, Ion: 1.01 mmol/L — ABNORMAL LOW (ref 1.15–1.40)
HCT: 28 % — ABNORMAL LOW (ref 39.0–52.0)
Hemoglobin: 9.5 g/dL — ABNORMAL LOW (ref 13.0–17.0)
O2 Saturation: 84 %
Potassium: 3.9 mmol/L (ref 3.5–5.1)
Sodium: 140 mmol/L (ref 135–145)
TCO2: 24 mmol/L (ref 22–32)
pCO2, Ven: 38.2 mmHg — ABNORMAL LOW (ref 44–60)
pH, Ven: 7.376 (ref 7.25–7.43)
pO2, Ven: 50 mmHg — ABNORMAL HIGH (ref 32–45)

## 2024-05-23 LAB — ECHO INTRAOPERATIVE TEE
Height: 68 in
Weight: 2832.47 [oz_av]

## 2024-05-23 LAB — PROTIME-INR
INR: 1.1 (ref 0.8–1.2)
INR: 1.3 — ABNORMAL HIGH (ref 0.8–1.2)
Prothrombin Time: 14.3 s (ref 11.4–15.2)
Prothrombin Time: 17 s — ABNORMAL HIGH (ref 11.4–15.2)

## 2024-05-23 LAB — HEPARIN LEVEL (UNFRACTIONATED): Heparin Unfractionated: 0.62 [IU]/mL (ref 0.30–0.70)

## 2024-05-23 LAB — APTT: aPTT: 36 s (ref 24–36)

## 2024-05-23 LAB — HEMOGLOBIN AND HEMATOCRIT, BLOOD
HCT: 27.3 % — ABNORMAL LOW (ref 39.0–52.0)
Hemoglobin: 9.3 g/dL — ABNORMAL LOW (ref 13.0–17.0)

## 2024-05-23 LAB — PLATELET COUNT: Platelets: 138 K/uL — ABNORMAL LOW (ref 150–400)

## 2024-05-23 SURGERY — CORONARY ARTERY BYPASS GRAFTING (CABG)
Anesthesia: General | Site: Chest

## 2024-05-23 MED ORDER — ORAL CARE MOUTH RINSE: 15.0000 mL | OROMUCOSAL | Status: DC | PRN

## 2024-05-23 MED ORDER — ACETAMINOPHEN 500 MG PO TABS
1000.0000 mg | ORAL_TABLET | Freq: Four times a day (QID) | ORAL | Status: DC
  Administered 2024-05-23 – 2024-05-28 (×18): 1000 mg via ORAL
  Filled 2024-05-23 (×18): qty 2

## 2024-05-23 MED ORDER — LACTATED RINGERS IV SOLN: INTRAVENOUS | Status: DC

## 2024-05-23 MED ORDER — CEFAZOLIN SODIUM-DEXTROSE 2-4 GM/100ML-% IV SOLN
2.0000 g | Freq: Three times a day (TID) | INTRAVENOUS | Status: AC
  Administered 2024-05-23 – 2024-05-25 (×6): 2 g via INTRAVENOUS
  Filled 2024-05-23 (×6): qty 100

## 2024-05-23 MED ORDER — FENTANYL CITRATE (PF) 250 MCG/5ML IJ SOLN
INTRAMUSCULAR | Status: AC
  Filled 2024-05-23: qty 5

## 2024-05-23 MED ORDER — PANTOPRAZOLE SODIUM 40 MG PO TBEC
40.0000 mg | DELAYED_RELEASE_TABLET | Freq: Every day | ORAL | Status: DC
Start: 2024-05-25 — End: 2024-05-28
  Administered 2024-05-25 – 2024-05-28 (×4): 40 mg via ORAL
  Filled 2024-05-23 (×4): qty 1

## 2024-05-23 MED ORDER — BISACODYL 10 MG RE SUPP
10.0000 mg | Freq: Every day | RECTAL | Status: DC
  Administered 2024-05-26: 10 mg via RECTAL
  Filled 2024-05-23: qty 1

## 2024-05-23 MED ORDER — ONDANSETRON HCL 4 MG/2ML IJ SOLN
4.0000 mg | Freq: Four times a day (QID) | INTRAMUSCULAR | Status: DC | PRN
  Administered 2024-05-27: 4 mg via INTRAVENOUS
  Filled 2024-05-23: qty 2

## 2024-05-23 MED ORDER — ASPIRIN 81 MG PO CHEW: 324.0000 mg | CHEWABLE_TABLET | Freq: Every day | ORAL | Status: DC

## 2024-05-23 MED ORDER — HEPARIN SODIUM (PORCINE) 1000 UNIT/ML IJ SOLN
INTRAMUSCULAR | Status: DC | PRN
  Administered 2024-05-23: 30000 [IU] via INTRAVENOUS

## 2024-05-23 MED ORDER — PHENYLEPHRINE HCL-NACL 20-0.9 MG/250ML-% IV SOLN
0.0000 ug/min | INTRAVENOUS | Status: DC
Start: 2024-05-23 — End: 2024-05-24

## 2024-05-23 MED ORDER — DEXTROSE 50 % IV SOLN: 0.0000 mL | INTRAVENOUS | Status: DC | PRN

## 2024-05-23 MED ORDER — ROCURONIUM BROMIDE 10 MG/ML (PF) SYRINGE
PREFILLED_SYRINGE | INTRAVENOUS | Status: DC | PRN
  Administered 2024-05-23: 30 mg via INTRAVENOUS
  Administered 2024-05-23: 20 mg via INTRAVENOUS
  Administered 2024-05-23: 100 mg via INTRAVENOUS
  Administered 2024-05-23: 20 mg via INTRAVENOUS

## 2024-05-23 MED ORDER — VANCOMYCIN HCL IN DEXTROSE 1-5 GM/200ML-% IV SOLN
1000.0000 mg | Freq: Once | INTRAVENOUS | Status: AC
  Administered 2024-05-23: 1000 mg via INTRAVENOUS
  Filled 2024-05-23: qty 200

## 2024-05-23 MED ORDER — SODIUM CHLORIDE 0.9% FLUSH
10.0000 mL | Freq: Two times a day (BID) | INTRAVENOUS | Status: DC
  Administered 2024-05-23 – 2024-05-28 (×8): 10 mL

## 2024-05-23 MED ORDER — MORPHINE SULFATE (PF) 2 MG/ML IV SOLN
INTRAVENOUS | Status: AC
  Administered 2024-05-23: 2 mg via INTRAVENOUS
  Filled 2024-05-23: qty 1

## 2024-05-23 MED ORDER — SODIUM CHLORIDE 0.9% FLUSH: 3.0000 mL | INTRAVENOUS | Status: DC | PRN

## 2024-05-23 MED ORDER — PROTAMINE SULFATE 10 MG/ML IV SOLN
INTRAVENOUS | Status: AC
Start: 2024-05-23 — End: 2024-05-23
  Filled 2024-05-23: qty 25

## 2024-05-23 MED ORDER — DEXMEDETOMIDINE HCL IN NACL 400 MCG/100ML IV SOLN: 0.0000 ug/kg/h | INTRAVENOUS | Status: DC

## 2024-05-23 MED ORDER — ACETAMINOPHEN 160 MG/5ML PO SOLN: 1000.0000 mg | Freq: Four times a day (QID) | ORAL | Status: DC

## 2024-05-23 MED ORDER — MORPHINE SULFATE (PF) 2 MG/ML IV SOLN
1.0000 mg | INTRAVENOUS | Status: DC | PRN
  Administered 2024-05-24: 2 mg via INTRAVENOUS
  Filled 2024-05-23: qty 1

## 2024-05-23 MED ORDER — EPHEDRINE SULFATE-NACL 50-0.9 MG/10ML-% IV SOSY
PREFILLED_SYRINGE | INTRAVENOUS | Status: DC | PRN
  Administered 2024-05-23 (×4): 5 mg via INTRAVENOUS

## 2024-05-23 MED ORDER — ASPIRIN 81 MG PO CHEW
324.0000 mg | CHEWABLE_TABLET | Freq: Once | ORAL | Status: AC
  Administered 2024-05-23: 324 mg via ORAL
  Filled 2024-05-23: qty 4

## 2024-05-23 MED ORDER — PHENYLEPHRINE 80 MCG/ML (10ML) SYRINGE FOR IV PUSH (FOR BLOOD PRESSURE SUPPORT)
PREFILLED_SYRINGE | INTRAVENOUS | Status: DC | PRN
  Administered 2024-05-23: 80 ug via INTRAVENOUS
  Administered 2024-05-23: 160 ug via INTRAVENOUS
  Administered 2024-05-23 (×2): 80 ug via INTRAVENOUS

## 2024-05-23 MED ORDER — MIDAZOLAM HCL (PF) 10 MG/2ML IJ SOLN
INTRAMUSCULAR | Status: AC
  Filled 2024-05-23: qty 2

## 2024-05-23 MED ORDER — BISACODYL 5 MG PO TBEC
10.0000 mg | DELAYED_RELEASE_TABLET | Freq: Every day | ORAL | Status: DC
  Administered 2024-05-25 – 2024-05-27 (×2): 10 mg via ORAL
  Filled 2024-05-23 (×3): qty 2

## 2024-05-23 MED ORDER — MAGNESIUM SULFATE 4 GM/100ML IV SOLN
4.0000 g | Freq: Once | INTRAVENOUS | Status: AC
  Administered 2024-05-23: 4 g via INTRAVENOUS
  Filled 2024-05-23: qty 100

## 2024-05-23 MED ORDER — TRAMADOL HCL 50 MG PO TABS
50.0000 mg | ORAL_TABLET | ORAL | Status: DC | PRN
  Administered 2024-05-24 – 2024-05-28 (×8): 100 mg via ORAL
  Filled 2024-05-23 (×8): qty 2

## 2024-05-23 MED ORDER — FENTANYL CITRATE (PF) 250 MCG/5ML IJ SOLN
INTRAMUSCULAR | Status: DC | PRN
  Administered 2024-05-23 (×2): 100 ug via INTRAVENOUS
  Administered 2024-05-23: 150 ug via INTRAVENOUS
  Administered 2024-05-23 (×2): 100 ug via INTRAVENOUS
  Administered 2024-05-23: 50 ug via INTRAVENOUS
  Administered 2024-05-23: 150 ug via INTRAVENOUS
  Administered 2024-05-23: 200 ug via INTRAVENOUS
  Administered 2024-05-23: 50 ug via INTRAVENOUS

## 2024-05-23 MED ORDER — PROPOFOL 10 MG/ML IV BOLUS
INTRAVENOUS | Status: AC
  Filled 2024-05-23: qty 20

## 2024-05-23 MED ORDER — ORAL CARE MOUTH RINSE: 15.0000 mL | OROMUCOSAL | Status: DC

## 2024-05-23 MED ORDER — GLYCOPYRROLATE 0.2 MG/ML IJ SOLN
INTRAMUSCULAR | Status: DC | PRN
  Administered 2024-05-23: .1 mg via INTRAVENOUS

## 2024-05-23 MED ORDER — CHLORHEXIDINE GLUCONATE 0.12 % MT SOLN
15.0000 mL | OROMUCOSAL | Status: AC
  Administered 2024-05-23: 15 mL via OROMUCOSAL
  Filled 2024-05-23: qty 15

## 2024-05-23 MED ORDER — HEPARIN SODIUM (PORCINE) 1000 UNIT/ML IJ SOLN
INTRAMUSCULAR | Status: AC
  Filled 2024-05-23: qty 1

## 2024-05-23 MED ORDER — DOCUSATE SODIUM 100 MG PO CAPS
200.0000 mg | ORAL_CAPSULE | Freq: Every day | ORAL | Status: DC
  Administered 2024-05-24 – 2024-05-27 (×4): 200 mg via ORAL
  Filled 2024-05-23 (×4): qty 2

## 2024-05-23 MED ORDER — POTASSIUM CHLORIDE 10 MEQ/50ML IV SOLN
10.0000 meq | INTRAVENOUS | Status: AC
  Administered 2024-05-23 (×3): 10 meq via INTRAVENOUS

## 2024-05-23 MED ORDER — SODIUM CHLORIDE 0.45 % IV SOLN: INTRAVENOUS | Status: AC | PRN

## 2024-05-23 MED ORDER — OXYCODONE HCL 5 MG PO TABS: 5.0000 mg | ORAL_TABLET | ORAL | Status: DC | PRN

## 2024-05-23 MED ORDER — PROTAMINE SULFATE 10 MG/ML IV SOLN
INTRAVENOUS | Status: DC | PRN
Start: 2024-05-23 — End: 2024-05-23
  Administered 2024-05-23: 20 mg via INTRAVENOUS
  Administered 2024-05-23: 280 mg via INTRAVENOUS

## 2024-05-23 MED ORDER — ASPIRIN 325 MG PO TBEC
325.0000 mg | DELAYED_RELEASE_TABLET | Freq: Every day | ORAL | Status: DC
  Administered 2024-05-24 – 2024-05-28 (×5): 325 mg via ORAL
  Filled 2024-05-23 (×5): qty 1

## 2024-05-23 MED ORDER — MIDAZOLAM HCL (PF) 2 MG/2ML IJ SOLN: 2.0000 mg | INTRAMUSCULAR | Status: DC | PRN

## 2024-05-23 MED ORDER — SODIUM CHLORIDE 0.9 % IV SOLN: INTRAVENOUS | Status: AC

## 2024-05-23 MED ORDER — METOCLOPRAMIDE HCL 5 MG/ML IJ SOLN
10.0000 mg | Freq: Four times a day (QID) | INTRAMUSCULAR | Status: AC
  Administered 2024-05-23 – 2024-05-24 (×5): 10 mg via INTRAVENOUS
  Filled 2024-05-23 (×6): qty 2

## 2024-05-23 MED ORDER — SUGAMMADEX SODIUM 200 MG/2ML IV SOLN
INTRAVENOUS | Status: DC | PRN
  Administered 2024-05-23: 150 mg via INTRAVENOUS

## 2024-05-23 MED ORDER — SODIUM CHLORIDE (PF) 0.9 % IJ SOLN
OROMUCOSAL | Status: DC | PRN
  Administered 2024-05-23 (×2): 4 mL via TOPICAL

## 2024-05-23 MED ORDER — 0.9 % SODIUM CHLORIDE (POUR BTL) OPTIME
TOPICAL | Status: DC | PRN
  Administered 2024-05-23: 5000 mL

## 2024-05-23 MED ORDER — INSULIN REGULAR(HUMAN) IN NACL 100-0.9 UT/100ML-% IV SOLN
INTRAVENOUS | Status: AC
  Administered 2024-05-23 (×3): 1.5 [IU]/h via INTRAVENOUS

## 2024-05-23 MED ORDER — SODIUM CHLORIDE 0.9% FLUSH: 10.0000 mL | INTRAVENOUS | Status: DC | PRN

## 2024-05-23 MED ORDER — METOPROLOL TARTRATE 12.5 MG HALF TABLET
12.5000 mg | ORAL_TABLET | Freq: Two times a day (BID) | ORAL | Status: DC
  Administered 2024-05-24: 12.5 mg via ORAL
  Filled 2024-05-23: qty 1

## 2024-05-23 MED ORDER — PHENYLEPHRINE HCL-NACL 20-0.9 MG/250ML-% IV SOLN
INTRAVENOUS | Status: DC | PRN
  Administered 2024-05-23: 40 ug/min via INTRAVENOUS
  Administered 2024-05-23: 25 ug/min via INTRAVENOUS

## 2024-05-23 MED ORDER — PANTOPRAZOLE SODIUM 40 MG IV SOLR
40.0000 mg | Freq: Every day | INTRAVENOUS | Status: AC
  Administered 2024-05-23 – 2024-05-24 (×2): 40 mg via INTRAVENOUS
  Filled 2024-05-23 (×2): qty 10

## 2024-05-23 MED ORDER — METOPROLOL TARTRATE 25 MG/10 ML ORAL SUSPENSION: 12.5000 mg | Freq: Two times a day (BID) | ORAL | Status: DC

## 2024-05-23 MED ORDER — SODIUM CHLORIDE 0.9 % IV SOLN: 250.0000 mL | INTRAVENOUS | Status: AC

## 2024-05-23 MED ORDER — CHLORHEXIDINE GLUCONATE CLOTH 2 % EX PADS
6.0000 | MEDICATED_PAD | Freq: Every day | CUTANEOUS | Status: DC
  Administered 2024-05-23 – 2024-05-28 (×5): 6 via TOPICAL

## 2024-05-23 MED ORDER — ALBUMIN HUMAN 5 % IV SOLN: INTRAVENOUS | Status: DC | PRN

## 2024-05-23 MED ORDER — MIDAZOLAM HCL (PF) 5 MG/ML IJ SOLN
INTRAMUSCULAR | Status: DC | PRN
  Administered 2024-05-23: 1 mg via INTRAVENOUS
  Administered 2024-05-23: 2 mg via INTRAVENOUS
  Administered 2024-05-23: 3 mg via INTRAVENOUS
  Administered 2024-05-23: 1 mg via INTRAVENOUS

## 2024-05-23 MED ORDER — SODIUM CHLORIDE 0.9% FLUSH
3.0000 mL | Freq: Two times a day (BID) | INTRAVENOUS | Status: DC
  Administered 2024-05-24 – 2024-05-28 (×6): 3 mL via INTRAVENOUS

## 2024-05-23 MED ORDER — HEPARIN 6000 UNIT IRRIGATION SOLUTION
Status: DC | PRN
  Administered 2024-05-23: 1

## 2024-05-23 MED ORDER — ACETAMINOPHEN 160 MG/5ML PO SOLN
650.0000 mg | Freq: Once | ORAL | Status: AC
  Administered 2024-05-23: 650 mg
  Filled 2024-05-23: qty 20.3

## 2024-05-23 MED ORDER — METOPROLOL TARTRATE 5 MG/5ML IV SOLN: 2.5000 mg | INTRAVENOUS | Status: DC | PRN

## 2024-05-23 MED ORDER — PROTAMINE SULFATE 10 MG/ML IV SOLN
INTRAVENOUS | Status: AC
Start: 2024-05-23 — End: 2024-05-23
  Filled 2024-05-23: qty 5

## 2024-05-23 MED ORDER — CHLORHEXIDINE GLUCONATE 0.12 % MT SOLN: 15.0000 mL | OROMUCOSAL | Status: DC

## 2024-05-23 MED ORDER — ALBUMIN HUMAN 5 % IV SOLN
250.0000 mL | INTRAVENOUS | Status: DC | PRN
  Administered 2024-05-23: 12.5 g via INTRAVENOUS

## 2024-05-23 MED ORDER — ALBUMIN HUMAN 5 % IV SOLN
25.0000 g | Freq: Once | INTRAVENOUS | Status: AC
Start: 2024-05-23 — End: 2024-05-23
  Administered 2024-05-23: 25 g via INTRAVENOUS
  Filled 2024-05-23: qty 500

## 2024-05-23 SURGICAL SUPPLY — 71 items
ADAPTER MULTI PERFUSION 15 (ADAPTER) ×1 IMPLANT
BAG DECANTER FOR FLEXI CONT (MISCELLANEOUS) ×1 IMPLANT
BLADE STERNUM SYSTEM 6 (BLADE) ×1 IMPLANT
BNDG ELASTIC 4INX 5YD STR LF (GAUZE/BANDAGES/DRESSINGS) IMPLANT
BNDG ELASTIC 4X5.8 VLCR STR LF (GAUZE/BANDAGES/DRESSINGS) ×1 IMPLANT
BNDG ELASTIC 6INX 5YD STR LF (GAUZE/BANDAGES/DRESSINGS) ×1 IMPLANT
BNDG GAUZE DERMACEA FLUFF 4 (GAUZE/BANDAGES/DRESSINGS) ×1 IMPLANT
CANISTER SUCTION 3000ML PPV (SUCTIONS) ×1 IMPLANT
CANNULA AORTIC ROOT 9FR (CANNULA) ×1 IMPLANT
CANNULA MC2 2 STG 29/37 NON-V (CANNULA) ×1 IMPLANT
CANNULA NON VENT 20FR 12 (CANNULA) ×1 IMPLANT
CATH ROBINSON RED A/P 18FR (CATHETERS) ×2 IMPLANT
CONNECTOR BLAKE 2:1 CARIO BLK (MISCELLANEOUS) ×1 IMPLANT
CONTAINER PROTECT SURGISLUSH (MISCELLANEOUS) ×2 IMPLANT
DERMABOND ADVANCED .7 DNX12 (GAUZE/BANDAGES/DRESSINGS) IMPLANT
DRAIN CHANNEL 19F RND (DRAIN) ×3 IMPLANT
DRAIN CONNECTOR BLAKE 1:1 (MISCELLANEOUS) IMPLANT
DRAPE SRG 135X102X78XABS (DRAPES) ×1 IMPLANT
DRAPE WARM FLUID 44X44 (DRAPES) ×1 IMPLANT
DRSG AQUACEL AG ADV 3.5X10 (GAUZE/BANDAGES/DRESSINGS) ×1 IMPLANT
DRSG COVADERM 4X10 (GAUZE/BANDAGES/DRESSINGS) IMPLANT
ELECTRODE BLDE 4.0 EZ CLN MEGD (MISCELLANEOUS) ×1 IMPLANT
ELECTRODE REM PT RTRN 9FT ADLT (ELECTROSURGICAL) ×2 IMPLANT
FELT TEFLON 1X6 (MISCELLANEOUS) ×2 IMPLANT
GAUZE SPONGE 4X4 12PLY STRL (GAUZE/BANDAGES/DRESSINGS) ×2 IMPLANT
GLOVE BIO SURGEON STRL SZ7 (GLOVE) IMPLANT
GLOVE BIO SURGEON STRL SZ7.5 (GLOVE) IMPLANT
GLOVE BIOGEL M STRL SZ7.5 (GLOVE) IMPLANT
GLOVE SS BIOGEL STRL SZ 7.5 (GLOVE) IMPLANT
GOWN STRL REUS W/ TWL LRG LVL3 (GOWN DISPOSABLE) ×4 IMPLANT
GOWN STRL REUS W/ TWL XL LVL3 (GOWN DISPOSABLE) ×2 IMPLANT
GOWN STRL SURGICAL XL XLNG (GOWN DISPOSABLE) ×2 IMPLANT
HEMOSTAT POWDER SURGIFOAM 1G (HEMOSTASIS) ×2 IMPLANT
INSERT SUTURE HOLDER (MISCELLANEOUS) ×1 IMPLANT
KIT BASIN OR (CUSTOM PROCEDURE TRAY) ×1 IMPLANT
KIT TURNOVER KIT B (KITS) ×1 IMPLANT
KIT VASOVIEW HEMOPRO 2 VH 4000 (KITS) ×1 IMPLANT
LEAD PACING MYOCARDI (MISCELLANEOUS) ×1 IMPLANT
MARKER DISTAL GRAFT W/ HOLDER (MISCELLANEOUS) ×3 IMPLANT
PACK E OPEN HEART (SUTURE) ×1 IMPLANT
PACK OPEN HEART (CUSTOM PROCEDURE TRAY) ×1 IMPLANT
PAD ARMBOARD POSITIONER FOAM (MISCELLANEOUS) ×2 IMPLANT
PAD ELECT DEFIB RADIOL ZOLL (MISCELLANEOUS) ×1 IMPLANT
PENCIL BUTTON HOLSTER BLD 10FT (ELECTRODE) ×1 IMPLANT
POSITIONER HEAD DONUT 9IN (MISCELLANEOUS) ×1 IMPLANT
PUNCH AORTIC ROTATE 4.0MM (MISCELLANEOUS) ×1 IMPLANT
SET MPS 3-ND DEL (MISCELLANEOUS) IMPLANT
SOLN 0.9% NACL POUR BTL 1000ML (IV SOLUTION) ×5 IMPLANT
SOLN STERILE WATER BTL 1000 ML (IV SOLUTION) ×2 IMPLANT
STOPCOCK 4 WAY LG BORE MALE ST (IV SETS) IMPLANT
SUPPORT HEART JANKE-BARRON (MISCELLANEOUS) ×1 IMPLANT
SUT ETHIBOND X763 2 0 SH 1 (SUTURE) ×2 IMPLANT
SUT MNCRL AB 3-0 PS2 18 (SUTURE) ×2 IMPLANT
SUT PDS AB 1 CTX 36 (SUTURE) ×2 IMPLANT
SUT PROLENE 4 0 SH DA (SUTURE) ×1 IMPLANT
SUT PROLENE 5 0 C 1 36 (SUTURE) ×3 IMPLANT
SUT PROLENE 7 0 BV 1 (SUTURE) IMPLANT
SUT PROLENE 7 0 BV1 MDA (SUTURE) ×1 IMPLANT
SUT STEEL 6MS V (SUTURE) ×2 IMPLANT
SUT VIC AB 2-0 CT1 TAPERPNT 27 (SUTURE) IMPLANT
SUT VIC AB 3-0 X1 27 (SUTURE) IMPLANT
SYSTEM SAHARA CHEST DRAIN ATS (WOUND CARE) ×1 IMPLANT
TAPE CLOTH SURG 4X10 WHT LF (GAUZE/BANDAGES/DRESSINGS) IMPLANT
TAPE PAPER 2X10 WHT MICROPORE (GAUZE/BANDAGES/DRESSINGS) IMPLANT
TOWEL GREEN STERILE (TOWEL DISPOSABLE) ×1 IMPLANT
TOWEL GREEN STERILE FF (TOWEL DISPOSABLE) ×1 IMPLANT
TRAY FOLEY SLVR 16FR TEMP STAT (SET/KITS/TRAYS/PACK) ×1 IMPLANT
TUBE SUCT INTRACARD DLP 20F (MISCELLANEOUS) ×1 IMPLANT
TUBE SUCTION CARDIAC 10FR (CANNULA) ×1 IMPLANT
TUBING LAP HI FLOW INSUFFLATIO (TUBING) ×1 IMPLANT
UNDERPAD 30X36 HEAVY ABSORB (UNDERPADS AND DIAPERS) ×1 IMPLANT

## 2024-05-23 NOTE — Progress Notes (Signed)
  Echocardiogram Echocardiogram Transesophageal has been performed.  Matthew Patterson 05/23/2024, 8:11 AM

## 2024-05-23 NOTE — Hospital Course (Signed)
  History of Present Illness: At time of CT surgical consultation   we have asked to see this 68 year old male in cardiothoracic surgical surgical consultation for consideration of possible coronary artery surgical revascularization.  The patient presented to the emergency department yesterday with complaints of both shortness of breath and chest pain.  He has a significant cardiac risk profile with multiple comorbidities.  There was also some pain that did  radiate to his back as well as some dizziness.  This has been occurring for the past few weeks and are primarily noted to be with physical exertion.  Symptoms typically resolve after a few minutes of rest.  Cardiac history includes a remote diagnosis of heart murmur as well as a normal stress test in 2014 although notable he did not achieve the target heart rate.  He smokes occasional cigars and uses a occasional alcohol but none recently.  He does have a history of several family members who had significant heart disease.  His Myoview in 2014 showed normal heart function.  He was admitted to the hospital for further evaluation treatment cardiology consultation.  A chest CT with coronary morphology did reveal findings of severe RCA and significant left main disease.  There is also at least moderate disease in the LAD and circumflex systems.  He was felt to require cardiac catheterization which was also performed on and results showed severe three-vessel coronary artery disease.  He had normal LV filling pressures and LVEDP measured 9 mmHg, PCWP 11/9, mean 11 mmHg.  Right ventricular filling pressures were also noted to be normal.  He is not felt to be a suitable candidate for PCI.  Echocardiogram has also been performed and shows normal LVEF with ejection fraction 60 to 65%.  Right ventricular systolic function was also noted to be normal.  A small PFO cannot be excluded.  He has no significant valvular disease .  Following full review of the patient and all  relevant studies he was felt to be candidate for CABG.   Hospital course: After full diagnostic evaluation and medical stabilization he was felt to be stable to proceed with surgery and on 05/23/2024 he was taken the operating room and underwent CABG x 3.  He tolerated the procedure well and was taken to the surgical intensive care unit in stable condition.

## 2024-05-23 NOTE — Brief Op Note (Signed)
 05/17/2024 - 05/23/2024  2:24 PM  PATIENT:  Matthew Patterson  68 y.o. male  PRE-OPERATIVE DIAGNOSIS:  CORONARY ARTERY DISEASE  POST-OPERATIVE DIAGNOSIS:  CORONARY ARTERY DISEASE  PROCEDURE:  Procedure(s): CORONARY ARTERY BYPASS GRAFTING (CABG) X THREE, USING LEFT INTERNAL MAMMARY ARTERY AND RIGHT LEG GREATER SAPHENOUS VEIN HARVESTED ENDOSCOPICALLY (N/A) Vein harvest time: Vein prep time: LIMA-LAD SVG-OM1 SVG-OM2  SURGEON:  Surgeons and Role:    * Lightfoot, Linnie KIDD, MD - Primary  PHYSICIAN ASSISTANT: Margeaux Swantek PA-C  ASSISTANTS: LUKE MAYOTTE RNFA   ANESTHESIA:   general  EBL:  760 mL   BLOOD ADMINISTERED:none  DRAINS: LEFT PLEURAL AND MEDIASTINAL CHEST TUBES   LOCAL MEDICATIONS USED:  NONE  SPECIMEN:  No Specimen  DISPOSITION OF SPECIMEN:  N/A  COUNTS:  YES  TOURNIQUET:  * No tourniquets in log *  DICTATION: .Dragon Dictation  PLAN OF CARE: Admit to inpatient   PATIENT DISPOSITION:  ICU - intubated and hemodynamically stable.   Delay start of Pharmacological VTE agent (>24hrs) due to surgical blood loss or risk of bleeding: yes  COMPLICATIONS: NO KNOWN

## 2024-05-23 NOTE — Anesthesia Postprocedure Evaluation (Signed)
 Anesthesia Post Note  Patient: Matthew Patterson  Procedure(s) Performed: CORONARY ARTERY BYPASS GRAFTING (CABG) X THREE, USING LEFT INTERNAL MAMMARY ARTERY AND RIGHT LEG GREATER SAPHENOUS VEIN HARVESTED ENDOSCOPICALLY (Chest)     Patient location during evaluation: ICU Anesthesia Type: General Level of consciousness: sedated and patient remains intubated per anesthesia plan Pain management: pain level controlled Vital Signs Assessment: post-procedure vital signs reviewed and stable Respiratory status: patient remains intubated per anesthesia plan Cardiovascular status: stable Postop Assessment: no apparent nausea or vomiting Anesthetic complications: no   No notable events documented.  Last Vitals:  Vitals:   05/23/24 1330 05/23/24 1345  BP:    Pulse: 68 69  Resp: 16 16  Temp: (!) 35.5 C (!) 35.6 C  SpO2: 100% 100%    Last Pain:  Vitals:   05/23/24 0636  TempSrc: Oral  PainSc:                  Debby FORBES Like

## 2024-05-23 NOTE — Anesthesia Procedure Notes (Signed)
 Arterial Line Insertion Start/End10/21/2025 7:10 AM, 05/23/2024 7:20 AM Performed by: CRNA  Patient location: Pre-op. Preanesthetic checklist: patient identified, IV checked, site marked, risks and benefits discussed, surgical consent, monitors and equipment checked, pre-op evaluation, timeout performed and anesthesia consent Lidocaine 1% used for infiltration Left, radial was placed Catheter size: 20 G Hand hygiene performed  and maximum sterile barriers used   Attempts: 2 Following insertion, dressing applied and Biopatch. Post procedure assessment: normal  Patient tolerated the procedure well with no immediate complications.

## 2024-05-23 NOTE — Consult Note (Signed)
 NAME:  Matthew Patterson, MRN:  [id number], DOB:  08-20-1955, LOS: 3 ADMISSION DATE:  05/17/2024, CONSULTATION DATE:  05/23/24 REFERRING MD:  Shyrl, CHIEF COMPLAINT:  CABG    History of Present Illness:  Matthew Patterson is a 68 y.o. M with PMH significant for type 2 DM, HL, anxiety, depression and GERD who initially presented to the ED with chest and back pain with dyspnea and dizziness for two weeks.  He was bradycardic with an abnormal EKG, so was admitted to [facility] and cardiology consulted.  He underwent a CT coronary morphology which showed severe RCA and L main disease; a subsequent LHC showed severe three vessel CAD, Echo with preserved EF and normal R sided heart function.  In this setting CT surgery was consulted and opted to proceed with a CABG which pt underwent on 05/23/24.   Pertinent  Medical History   has a past medical history of Anxiety and depression, Diabetes mellitus without complication (HCC), GERD (gastroesophageal reflux disease), Hyperlipidemia, and Melanoma (HCC) (2023).   Significant Hospital Events: Including procedures, antibiotic start and stop dates in addition to other pertinent events   10/15 admit to [facility] 10/21 CABG and ICU post-op  Interim History / Subjective:  Uncomplicated case with EBL 760cc, 230cc cell saver returned UOP 1650cc Easy airway CI initially 2.1-2.2 off pump  CO 3.8 CI 1.9  Objective    Blood pressure 118/86, pulse (!) 55, temperature 98.1 F (36.7 C), temperature source Oral, resp. rate 20, height 5' 8 (1.727 m), weight 80.3 kg, SpO2 99%.        Intake/Output Summary (Last 24 hours) at 05/23/2024 1046 Last data filed at 05/23/2024 1045 Gross per 24 hour  Intake 1915.36 ml  Output 300 ml  Net 1615.36 ml   Filed Weights   05/17/24 2239 05/23/24 0636  Weight: 80.3 kg 80.3 kg    General:  well nourished elderly M intubated and sedated HEENT: MM pink/moist, pupils equal sclera anicteric, orally intubated Neuro: examined on  sedation prior to paralytic reversal CV: s1s2 rrr, V-wires, mediastinal CT in place no m/r/g PULM:  mechanically ventilated, synchronous, clear bilaterally GI: soft, bsx4 active Extremities: warm/dry, no edema     Resolved problem list   Assessment and Plan   Multivessel coronary artery disease s/p CABG x3 Carotid Stenosis, L ICA 80-99%  Continue aspirin and statin Chest tube management TCTS Continue to titrate Precedex with RASS goal 0/-1 Closely monitor neuro exam with stenosis  Continue multimodality pain management with tramadol , oxycodone  and morphine  Closely monitor chest tube output GDMT once able to tolerate Albumin 500g now Continue Neosynephrine to maintain MAP 65-80  Acute respiratory insufficiency, postop Continue on protective ventilation VAP prevention bundle in place Rapid weaning protocol ordered is in place   Hypertension Holding Lisinopril  for now, as patient is requiring low-dose phenylephrine  Hyperlipidemia Continue rosuvastatin   Diabetes type 2 Patient hemoglobin A1c is 6.1 Hold home metformin   Currently on insulin infusion, monitor fingerstick with goal 140-180  Expected perioperative blood loss anemia Thrombocytopenia due to CPB Monitor H/H and PLT counts   Anxiety  Hold home Trazodone    Labs   CBC: Recent Labs  Lab 05/17/24 2243 05/18/24 1638 05/19/24 0413 05/19/24 1559 05/20/24 0441 05/21/24 0427 05/22/24 0410 05/23/24 0344 05/23/24 0801 05/23/24 0804 05/23/24 0917 05/23/24 0944 05/23/24 0948 05/23/24 1025  WBC 4.7 5.1 5.7  --  6.5 6.1 7.2 6.3  --   --   --   --   --   --  NEUTROABS 3.5 3.5  --   --   --   --   --   --   --   --   --   --   --   --   HGB 13.5 15.1 14.3   < > 14.6 14.6 14.6 15.2   < > 13.6 12.9* 8.8* 9.5* 9.3*  HCT 39.4 44.1 41.3   < > 42.7 41.9 41.6 43.8   < > 40.0 38.0* 26.0* 28.0* 27.3*  MCV 92.9 92.6 92.8  --  92.6 92.3 90.8 92.0  --   --   --   --   --   --   PLT 176 198 175  --  171 188 196 195   --   --   --   --   --  138*   < > = values in this interval not displayed.    Basic Metabolic Panel: Recent Labs  Lab 05/19/24 0413 05/19/24 1559 05/20/24 0441 05/21/24 0427 05/22/24 0410 05/23/24 0344 05/23/24 0801 05/23/24 0804 05/23/24 0917 05/23/24 0944 05/23/24 0948  NA 138   < > 137 138 137 138 141 139 140 139 140  K 3.8   < > 3.8 3.7 3.8 3.6 4.1 4.0 4.1 4.5 3.9  CL 108  --  106 107 106 107 105  --  106  --   --   CO2 22  --  21* 21* 19* 21*  --   --   --   --   --   GLUCOSE 107*  --  134* 133* 135* 120* 132*  --  132*  --   --   BUN 22  --  27* 27* 22 22 22   --  21  --   --   CREATININE 1.40*  --  1.46* 1.36* 1.30* 1.29* 1.20  --  1.10  --   --   CALCIUM  8.7*  --  8.9 9.1 8.8* 9.0  --   --   --   --   --   MG  --   --   --   --  1.9  --   --   --   --   --   --    < > = values in this interval not displayed.   GFR: Estimated Creatinine Clearance: 62.2 mL/min (by C-G formula based on SCr of 1.1 mg/dL). Recent Labs  Lab 05/20/24 0441 05/21/24 0427 05/22/24 0410 05/23/24 0344  WBC 6.5 6.1 7.2 6.3    Liver Function Tests: Recent Labs  Lab 05/17/24 2243  AST 23  ALT 19  ALKPHOS 34*  BILITOT 0.4  PROT 6.5  ALBUMIN 4.2   No results for input(s): LIPASE, AMYLASE in the last 168 hours. No results for input(s): AMMONIA in the last 168 hours.  ABG    Component Value Date/Time   PHART 7.401 05/23/2024 0944   PCO2ART 34.6 05/23/2024 0944   PO2ART 391 (H) 05/23/2024 0944   HCO3 22.4 05/23/2024 0948   TCO2 24 05/23/2024 0948   ACIDBASEDEF 3.0 (H) 05/23/2024 0948   O2SAT 84 05/23/2024 0948     Coagulation Profile: Recent Labs  Lab 05/22/24 0410 05/23/24 0344  INR 1.0 1.1    Cardiac Enzymes: No results for input(s): CKTOTAL, CKMB, CKMBINDEX, TROPONINI in the last 168 hours.  HbA1C: Hemoglobin A1C  Date/Time Value Ref Range Status  12/02/2023 07:58 AM 6.2 (A) 4.0 - 5.6 % Final  06/03/2023 07:38 AM 5.9 (A) 4.0 -  5.6 % Final    HbA1c, POC (controlled diabetic range)  Date/Time Value Ref Range Status  07/11/2021 07:11 AM 5.9 0.0 - 7.0 % Final  04/07/2018 07:11 AM 6.6 0.0 - 7.0 % Final   Hgb A1c MFr Bld  Date/Time Value Ref Range Status  05/22/2024 04:10 AM 6.1 (H) 4.8 - 5.6 % Final    Comment:    (NOTE) Diagnosis of Diabetes The following HbA1c ranges recommended by the American Diabetes Association (ADA) may be used as an aid in the diagnosis of diabetes mellitus.  Hemoglobin             Suggested A1C NGSP%              Diagnosis  <5.7                   Non Diabetic  5.7-6.4                Pre-Diabetic  >6.4                   Diabetic  <7.0                   Glycemic control for                       adults with diabetes.    04/08/2021 07:43 AM 11.9 (H) 4.6 - 6.5 % Final    Comment:    Glycemic Control Guidelines for People with Diabetes:Non Diabetic:  <6%Goal of Therapy: <7%Additional Action Suggested:  >8%     CBG: Recent Labs  Lab 05/23/24 0641  GLUCAP 149*    Review of Systems:   Unable to obtain   Past Medical History:  He,  has a past medical history of Anxiety and depression, Diabetes mellitus without complication (HCC), GERD (gastroesophageal reflux disease), Hyperlipidemia, and Melanoma (HCC) (2023).   Surgical History:   Past Surgical History:  Procedure Laterality Date   RIGHT/LEFT HEART CATH AND CORONARY ANGIOGRAPHY N/A 05/19/2024   Procedure: RIGHT/LEFT HEART CATH AND CORONARY ANGIOGRAPHY;  Surgeon: [place], Peter M, MD;  [facility]: [facility] INVASIVE CV LAB;  Service: Cardiovascular;  Laterality: N/A;   ROTATOR CUFF REPAIR     SHOULDER SURGERY  2010   B/L     Social History:   reports that he has been smoking cigarettes and cigars. He started smoking about 15 years ago. He has never used smokeless tobacco. He reports current alcohol use of about 1.0 - 2.0 standard drink of alcohol per week. He reports that he does not use drugs.   Family History:  His family history  includes Cancer in his sister; Diabetes in his mother; Hypertension in his mother.   Allergies No Known Allergies   Home Medications  Prior to Admission medications   Medication Sig Start Date End Date Taking? Authorizing Provider  fenofibrate  (TRICOR ) 145 MG tablet TAKE 1 TABLET BY MOUTH DAILY 06/23/23  Yes Nafziger, Darleene, NP  lisinopril  (ZESTRIL ) 2.5 MG tablet TAKE 1 TABLET BY MOUTH DAILY 03/22/24  Yes Nafziger, Darleene, NP  metFORMIN  (GLUCOPHAGE -XR) 750 MG 24 hr tablet Take 1 tablet (750 mg total) by mouth daily with breakfast. 12/02/23  Yes Shamleffer, Ibtehal Jaralla, MD  rosuvastatin  (CRESTOR ) 40 MG tablet Take 1 tablet (40 mg total) by mouth daily. 12/06/23  Yes Shamleffer, Ibtehal Jaralla, MD  traZODone  (DESYREL ) 100 MG tablet TAKE 1 TABLET BY MOUTH AT BEDTIME 12/16/23  Yes Nafziger, Darleene, NP  Blood  Glucose Monitoring Suppl (ONETOUCH VERIO IQ SYSTEM) w/Device KIT 1 kit 12/29/16   Nafziger, Darleene, NP  Continuous Glucose Sensor (FREESTYLE LIBRE 3 PLUS SENSOR) MISC 1 Device by Other route every 14 (fourteen) days. Change sensor every 15 days. 06/03/23   Shamleffer, Ibtehal Jaralla, MD  glucose blood test strip Use as instructed 12/29/16   Nafziger, Darleene, NP  Lancets [facility] ULTRASOFT) lancets Use as instructed 12/29/16   Merna Darleene, NP     Critical care time: 40 minutes      CRITICAL CARE Performed by: Leita SAUNDERS Aleta Manternach   Total critical care time: 40 minutes  Critical care time was exclusive of separately billable procedures and treating other patients.  Critical care was necessary to treat or prevent imminent or life-threatening deterioration.  Critical care was time spent personally by me on the following activities: development of treatment plan with patient and/or surrogate as well as nursing, discussions with consultants, evaluation of patient's response to treatment, examination of patient, obtaining history from patient or surrogate, ordering and performing treatments and  interventions, ordering and review of laboratory studies, ordering and review of radiographic studies, pulse oximetry and re-evaluation of patient's condition.    Leita SAUNDERS Tashea Othman, PA-C [facility] See Amion for pager If no response to pager , please call [phone] until 7pm After 7:00 pm call Elink  [phone]

## 2024-05-23 NOTE — Anesthesia Procedure Notes (Signed)
 Central Venous Catheter Insertion Performed by: Lucious Debby BRAVO, MD, anesthesiologist Start/End10/21/2025 7:10 AM, 05/23/2024 7:21 AM Preanesthetic checklist: patient identified, IV checked, risks and benefits discussed, surgical consent, monitors and equipment checked, pre-op evaluation, timeout performed and anesthesia consent Position: Trendelenburg Lidocaine 1% used for infiltration and patient sedated Hand hygiene performed , maximum sterile barriers used  and Seldinger technique used Catheter size: 8.5 Fr Central line was placed.Sheath introducer Procedure performed using ultrasound to evaluate access site. Ultrasound Notes:relevant anatomy identified, ultrasound used to visualize needle entry, vessel patent under ultrasound and image(s) printed for medical record. Attempts: 1 Following insertion, line sutured, dressing applied and Biopatch. Post procedure assessment: blood return through all ports, free fluid flow and no air  Patient tolerated the procedure well with no immediate complications. Additional procedure comments: SLIC placed via Swan port.

## 2024-05-23 NOTE — Progress Notes (Signed)
      [address]       [place] [address]             [phone]       S/p CABG x 3  Extubated  BP 118/86   Pulse 71   Temp 99.9 F (37.7 C)   Resp 16   Ht 5' 8 (1.727 m)   Wt 80.3 kg   SpO2 99%   BMI 26.92 kg/m  CI 2.4, CVP 5 4L [place] 100% sat  Intake/Output Summary (Last 24 hours) at 05/23/2024 1850 Last data filed at 05/23/2024 1800 Gross per 24 hour  Intake 4649.46 ml  Output 4055 ml  Net 594.46 ml  CT 180 ml since OR  K 4.3 Hct 34  Doing well early postop  Elspeth C. Kerrin, MD [facility] [phone]

## 2024-05-23 NOTE — Progress Notes (Signed)
 Patient going for CABG today.  Will check on postoperatively tomorrow.

## 2024-05-23 NOTE — Anesthesia Procedure Notes (Signed)
 Procedure Name: Intubation Date/Time: 05/23/2024 7:52 AM  Performed by: Vertie Arthea RAMAN, CRNAPre-anesthesia Checklist: Patient identified, Emergency Drugs available, Suction available and Patient being monitored Patient Re-evaluated:Patient Re-evaluated prior to induction Oxygen Delivery Method: Circle System Utilized Preoxygenation: Pre-oxygenation with 100% oxygen Induction Type: IV induction Ventilation: Mask ventilation without difficulty Laryngoscope Size: Miller and 2 Grade View: Grade I Tube type: Oral Tube size: 8.0 mm Number of attempts: 1 Airway Equipment and Method: Stylet and Oral airway Placement Confirmation: ETT inserted through vocal cords under direct vision, positive ETCO2 and breath sounds checked- equal and bilateral Tube secured with: Tape Dental Injury: Teeth and Oropharynx as per pre-operative assessment

## 2024-05-23 NOTE — Progress Notes (Signed)
 Report given to [facility] RN short stay nurse. Pt is stable/no complaints/VSS.

## 2024-05-23 NOTE — Progress Notes (Signed)
     [address]       [place] [address]             [phone]       No events Vitals:   05/23/24 0435 05/23/24 0636  BP: 120/76 118/86  Pulse: 61 (!) 55  Resp: 18 20  Temp: 98.3 F (36.8 C) 98.1 F (36.7 C)  SpO2: 99% 99%   Alert NAD Sinus EWOB  OR for CABG

## 2024-05-23 NOTE — Progress Notes (Signed)
 Patient has been off the unit for CABG.  Will likely be in cardiothoracic surgery service post op.

## 2024-05-23 NOTE — Procedures (Signed)
 Extubation Procedure Note  Patient Details:   Name: Matthew Patterson DOB: November 20, 1955 MRN: [id number]   Airway Documentation:    Vent end date: 05/23/24 Vent end time: 1653   Evaluation  O2 sats: stable throughout Complications: No apparent complications Patient did tolerate procedure well. Bilateral Breath Sounds: Clear, Diminished   Yes, pt could speak post extubation.  Pt extubated to 4 l/m  per protocol.  Ozell KATHEE Blase 05/23/2024, 4:54 PM

## 2024-05-23 NOTE — Op Note (Signed)
 [address]       [name] [address]             [phone]                                          05/23/2024 Patient:  Matthew Patterson Pre-Op Dx: 3V CAD PVD HTN HLP   Post-op Dx:  same Procedure: CABG X 3.  LIMA LAD, RSVG OM1 and 2   Endoscopic greater saphenous vein harvest on the right   Surgeon and Role:      * Danylah Holden, Linnie KIDD, MD - Primary    DEWAINE MICAEL Cera , PA-C - assisting An experienced assistant was required given the complexity of this surgery and the standard of surgical care. The assistant was needed for exposure, dissection, suctioning, retraction of delicate tissues and sutures, instrument exchange and for overall help during this procedure.    Anesthesia  general EBL:  500ml Blood Administration: none Xclamp Time:  50 min Pump Time:   Drains: 57 F blake drain: L, mediastinal  Wires: V Counts: correct   Indications: We have asked to see this 68 year old male in cardiothoracic surgical surgical consultation for consideration of possible coronary artery surgical revascularization.  The patient presented to the emergency department yesterday with complaints of both shortness of breath and chest pain.  He has a significant cardiac risk profile with multiple comorbidities.  There was also some pain that did  radiate to his back as well as some dizziness.  This has been occurring for the past few weeks and are primarily noted to be with physical exertion.  LHC showed 3V CAD.   Findings: Good LIMA and vein.  Calcifed LAD, and OM2, good OM1  Operative Technique: All invasive lines were placed in pre-op holding.  After the risks, benefits and alternatives were thoroughly discussed, the patient was brought to the operative theatre.  Anesthesia was induced, and the patient was prepped and draped in normal sterile fashion.  An appropriate surgical pause was performed, and pre-operative antibiotics were dosed accordingly.  We began with  simultaneous incisions along the right leg for harvesting of the greater saphenous vein and the chest for the sternotomy.  In regards to the sternotomy, this was carried down with bovie cautery, and the sternum was divided with a reciprocating saw.  Meticulous hemostasis was obtained.  The left internal thoracic artery was exposed and harvested in in pedicled fashion.  The patient was systemically heparinized, and the artery was divided distally, and placed in a papaverine sponge.    The sternal elevator was removed, and a retractor was placed.  The pericardium was divided in the midline and fashioned into a cradle with pericardial stitches.   After we confirmed an appropriate ACT, the ascending aorta was cannulated in standard fashion.  The right atrial appendage was used for venous cannulation site.  Cardiopulmonary bypass was initiated, and the heart retractor was placed. The cross clamp was applied, and a dose of anterograde cardioplegia was given with good arrest of the heart.  Next we exposed the lateral wall, and found 2 good target on the OM1 and 2.  An end to side anastomosis with the vein graft was then created to each.  Finally, we exposed a good target on the  LAD, and fashioned an end to side anastomosis between it  and the LITA.  We began to re-warm, and a re-animation dose of cardioplegia was given.  The heart was de-aired, and the cross clamp was removed.  Meticulous hemostasis was obtained.    A partial occludding clamp was then placed on the ascending aorta, and we created an end to side anastomosis between it and the proximal vein grafts.  Rings were placed on the proximal anastomosis.  Hemostasis was obtained, and we separated from cardiopulmonary bypass without event.  The heparin was reversed with protamine.  Chest tubes and wires were placed, and the sternum was re-approximated with sternal wires.  The soft tissue and skin were re-approximated wth absorbable suture.    The patient  tolerated the procedure without any immediate complications, and was transferred to the ICU in guarded condition.  Kitrina Maurin MALVA Rayas

## 2024-05-23 NOTE — Plan of Care (Signed)
   Problem: Education: Goal: Knowledge of General Education information will improve Description Including pain rating scale, medication(s)/side effects and non-pharmacologic comfort measures Outcome: Progressing   Problem: Health Behavior/Discharge Planning: Goal: Ability to manage health-related needs will improve Outcome: Progressing

## 2024-05-23 NOTE — Transfer of Care (Signed)
 Immediate Anesthesia Transfer of Care Note  Patient: Matthew Patterson  Procedure(s) Performed: CORONARY ARTERY BYPASS GRAFTING (CABG) X THREE, USING LEFT INTERNAL MAMMARY ARTERY AND RIGHT LEG GREATER SAPHENOUS VEIN HARVESTED ENDOSCOPICALLY (Chest)  Patient Location: ICU  Anesthesia Type:General  Level of Consciousness: sedated and unresponsive  Airway & Oxygen Therapy: Patient remains intubated per anesthesia plan and Patient placed on Ventilator (see vital sign flow sheet for setting)  Post-op Assessment: Report given to RN and Post -op Vital signs reviewed and stable  Post vital signs: Reviewed and stable  Last Vitals:  Vitals Value Taken Time  BP    Temp 35.8 C 05/23/24 12:17  Pulse 71 05/23/24 12:17  Resp 16 05/23/24 12:17  SpO2 96 % 05/23/24 12:17  Vitals shown include unfiled device data.  Last Pain:  Vitals:   05/23/24 0636  TempSrc: Oral  PainSc:       Patients Stated Pain Goal: 0 (05/23/24 9365)  Complications: No notable events documented.

## 2024-05-24 ENCOUNTER — Encounter (HOSPITAL_COMMUNITY): Payer: Self-pay | Admitting: Thoracic Surgery (Cardiothoracic Vascular Surgery)

## 2024-05-24 ENCOUNTER — Inpatient Hospital Stay (HOSPITAL_COMMUNITY)

## 2024-05-24 DIAGNOSIS — J9811 Atelectasis: Secondary | ICD-10-CM | POA: Diagnosis not present

## 2024-05-24 DIAGNOSIS — Z48812 Encounter for surgical aftercare following surgery on the circulatory system: Secondary | ICD-10-CM | POA: Diagnosis not present

## 2024-05-24 DIAGNOSIS — R079 Chest pain, unspecified: Secondary | ICD-10-CM | POA: Diagnosis not present

## 2024-05-24 DIAGNOSIS — I10 Essential (primary) hypertension: Secondary | ICD-10-CM | POA: Diagnosis not present

## 2024-05-24 DIAGNOSIS — E119 Type 2 diabetes mellitus without complications: Secondary | ICD-10-CM | POA: Diagnosis not present

## 2024-05-24 DIAGNOSIS — J90 Pleural effusion, not elsewhere classified: Secondary | ICD-10-CM | POA: Diagnosis not present

## 2024-05-24 DIAGNOSIS — Z951 Presence of aortocoronary bypass graft: Secondary | ICD-10-CM | POA: Diagnosis not present

## 2024-05-24 LAB — CBC
HCT: 32.7 % — ABNORMAL LOW (ref 39.0–52.0)
HCT: 35.8 % — ABNORMAL LOW (ref 39.0–52.0)
Hemoglobin: 11.3 g/dL — ABNORMAL LOW (ref 13.0–17.0)
Hemoglobin: 12 g/dL — ABNORMAL LOW (ref 13.0–17.0)
MCH: 32.4 pg (ref 26.0–34.0)
MCH: 32 pg (ref 26.0–34.0)
MCHC: 34.6 g/dL (ref 30.0–36.0)
MCHC: 33.5 g/dL (ref 30.0–36.0)
MCV: 93.7 fL (ref 80.0–100.0)
MCV: 95.5 fL (ref 80.0–100.0)
Platelets: 153 K/uL (ref 150–400)
Platelets: 190 K/uL (ref 150–400)
RBC: 3.49 MIL/uL — ABNORMAL LOW (ref 4.22–5.81)
RBC: 3.75 MIL/uL — ABNORMAL LOW (ref 4.22–5.81)
RDW: 12.8 % (ref 11.5–15.5)
RDW: 12.8 % (ref 11.5–15.5)
WBC: 7.7 K/uL (ref 4.0–10.5)
WBC: 10.4 K/uL (ref 4.0–10.5)
nRBC: 0 % (ref 0.0–0.2)
nRBC: 0 % (ref 0.0–0.2)

## 2024-05-24 LAB — BASIC METABOLIC PANEL WITH GFR
Anion gap: 6 (ref 5–15)
Anion gap: 7 (ref 5–15)
BUN: 12 mg/dL (ref 8–23)
BUN: 14 mg/dL (ref 8–23)
CO2: 20 mmol/L — ABNORMAL LOW (ref 22–32)
CO2: 24 mmol/L (ref 22–32)
Calcium: 7.7 mg/dL — ABNORMAL LOW (ref 8.9–10.3)
Calcium: 7.9 mg/dL — ABNORMAL LOW (ref 8.9–10.3)
Chloride: 108 mmol/L (ref 98–111)
Chloride: 101 mmol/L (ref 98–111)
Creatinine, Ser: 1.22 mg/dL (ref 0.61–1.24)
Creatinine, Ser: 1.41 mg/dL — ABNORMAL HIGH (ref 0.61–1.24)
GFR, Estimated: >60 mL/min (ref >60)
GFR, Estimated: 54 mL/min — ABNORMAL LOW (ref >60)
Glucose, Bld: 114 mg/dL — ABNORMAL HIGH (ref 70–99)
Glucose, Bld: 163 mg/dL — ABNORMAL HIGH (ref 70–99)
Potassium: 4.2 mmol/L (ref 3.5–5.1)
Potassium: 4.3 mmol/L (ref 3.5–5.1)
Sodium: 134 mmol/L — ABNORMAL LOW (ref 135–145)
Sodium: 132 mmol/L — ABNORMAL LOW (ref 135–145)

## 2024-05-24 LAB — GLUCOSE, CAPILLARY
Glucose-Capillary: 113 mg/dL — ABNORMAL HIGH (ref 70–99)
Glucose-Capillary: 130 mg/dL — ABNORMAL HIGH (ref 70–99)
Glucose-Capillary: 123 mg/dL — ABNORMAL HIGH (ref 70–99)
Glucose-Capillary: 154 mg/dL — ABNORMAL HIGH (ref 70–99)
Glucose-Capillary: 148 mg/dL — ABNORMAL HIGH (ref 70–99)
Glucose-Capillary: 167 mg/dL — ABNORMAL HIGH (ref 70–99)
Glucose-Capillary: 147 mg/dL — ABNORMAL HIGH (ref 70–99)
Glucose-Capillary: 157 mg/dL — ABNORMAL HIGH (ref 70–99)

## 2024-05-24 LAB — MAGNESIUM
Magnesium: 2.6 mg/dL — ABNORMAL HIGH (ref 1.7–2.4)
Magnesium: 2.5 mg/dL — ABNORMAL HIGH (ref 1.7–2.4)

## 2024-05-24 MED ORDER — INSULIN ASPART 100 UNIT/ML IJ SOLN
0.0000 [IU] | INTRAMUSCULAR | Status: DC
  Administered 2024-05-24: 4 [IU] via SUBCUTANEOUS
  Administered 2024-05-24 – 2024-05-25 (×5): 2 [IU] via SUBCUTANEOUS

## 2024-05-24 MED ORDER — METOPROLOL TARTRATE 25 MG PO TABS
25.0000 mg | ORAL_TABLET | Freq: Two times a day (BID) | ORAL | Status: DC
  Administered 2024-05-24 – 2024-05-28 (×7): 25 mg via ORAL
  Filled 2024-05-24 (×8): qty 1

## 2024-05-24 MED ORDER — FUROSEMIDE 10 MG/ML IJ SOLN
40.0000 mg | Freq: Once | INTRAMUSCULAR | Status: AC
  Administered 2024-05-24: 40 mg via INTRAVENOUS
  Filled 2024-05-24: qty 4

## 2024-05-24 MED ORDER — INSULIN GLARGINE-YFGN 100 UNIT/ML ~~LOC~~ SOLN
8.0000 [IU] | Freq: Every day | SUBCUTANEOUS | Status: DC
  Administered 2024-05-24 – 2024-05-28 (×5): 8 [IU] via SUBCUTANEOUS
  Filled 2024-05-24 (×5): qty 0.08

## 2024-05-24 MED ORDER — ENOXAPARIN SODIUM 40 MG/0.4ML IJ SOSY: 40.0000 mg | PREFILLED_SYRINGE | Freq: Every day | INTRAMUSCULAR | Status: DC

## 2024-05-24 MED ORDER — METOPROLOL TARTRATE 12.5 MG HALF TABLET
12.5000 mg | ORAL_TABLET | Freq: Once | ORAL | Status: AC
  Administered 2024-05-24: 12.5 mg via ORAL
  Filled 2024-05-24: qty 1

## 2024-05-24 MED ORDER — LACTATED RINGERS IV SOLN: INTRAVENOUS | Status: AC

## 2024-05-24 MED ORDER — METHOCARBAMOL 500 MG PO TABS
750.0000 mg | ORAL_TABLET | Freq: Three times a day (TID) | ORAL | Status: DC
  Administered 2024-05-24 – 2024-05-28 (×13): 750 mg via ORAL
  Filled 2024-05-24 (×13): qty 2

## 2024-05-24 MED FILL — Sodium Bicarbonate IV Soln 8.4%: INTRAVENOUS | Qty: 50 | Status: AC

## 2024-05-24 MED FILL — Lidocaine HCl Local Preservative Free (PF) Inj 2%: INTRAMUSCULAR | Qty: 14 | Status: AC

## 2024-05-24 MED FILL — Calcium Chloride Inj 10%: INTRAVENOUS | Qty: 10 | Status: AC

## 2024-05-24 MED FILL — Mannitol IV Soln 20%: INTRAVENOUS | Qty: 500 | Status: AC

## 2024-05-24 MED FILL — Potassium Chloride Inj 2 mEq/ML: INTRAVENOUS | Qty: 40 | Status: AC

## 2024-05-24 MED FILL — Heparin Sodium (Porcine) Inj 1000 Unit/ML: Qty: 1000 | Status: AC

## 2024-05-24 MED FILL — Electrolyte-R (PH 7.4) Solution: INTRAVENOUS | Qty: 5000 | Status: AC

## 2024-05-24 MED FILL — Heparin Sodium (Porcine) Inj 1000 Unit/ML: INTRAMUSCULAR | Qty: 20 | Status: AC

## 2024-05-24 MED FILL — Sodium Chloride IV Soln 0.9%: INTRAVENOUS | Qty: 2000 | Status: AC

## 2024-05-24 MED FILL — Heparin Sodium (Porcine) Inj 1000 Unit/ML: INTRAMUSCULAR | Qty: 10 | Status: AC

## 2024-05-24 NOTE — Plan of Care (Signed)
  Problem: Education: Goal: Knowledge of General Education information will improve Description: Including pain rating scale, medication(s)/side effects and non-pharmacologic comfort measures Outcome: Progressing   Problem: Health Behavior/Discharge Planning: Goal: Ability to manage health-related needs will improve Outcome: Progressing   Problem: Clinical Measurements: Goal: Ability to maintain clinical measurements within normal limits will improve Outcome: Progressing Goal: Will remain free from infection Outcome: Progressing Goal: Diagnostic test results will improve Outcome: Progressing Goal: Respiratory complications will improve Outcome: Progressing Goal: Cardiovascular complication will be avoided Outcome: Progressing   Problem: Activity: Goal: Risk for activity intolerance will decrease Outcome: Progressing   Problem: Nutrition: Goal: Adequate nutrition will be maintained Outcome: Progressing   Problem: Coping: Goal: Level of anxiety will decrease Outcome: Progressing   Problem: Elimination: Goal: Will not experience complications related to bowel motility Outcome: Progressing Goal: Will not experience complications related to urinary retention Outcome: Progressing   Problem: Pain Managment: Goal: General experience of comfort will improve and/or be controlled Outcome: Progressing   Problem: Safety: Goal: Ability to remain free from injury will improve Outcome: Progressing   Problem: Skin Integrity: Goal: Risk for impaired skin integrity will decrease Outcome: Progressing   Problem: Education: Goal: Understanding of CV disease, CV risk reduction, and recovery process will improve Outcome: Progressing Goal: Individualized Educational Video(s) Outcome: Progressing   Problem: Activity: Goal: Ability to return to baseline activity level will improve Outcome: Progressing   Problem: Cardiovascular: Goal: Ability to achieve and maintain adequate  cardiovascular perfusion will improve Outcome: Progressing Goal: Vascular access site(s) Level 0-1 will be maintained Outcome: Progressing   Problem: Health Behavior/Discharge Planning: Goal: Ability to safely manage health-related needs after discharge will improve Outcome: Progressing   Problem: Education: Goal: Understanding of CV disease, CV risk reduction, and recovery process will improve Outcome: Progressing Goal: Individualized Educational Video(s) Outcome: Progressing   Problem: Activity: Goal: Ability to return to baseline activity level will improve Outcome: Progressing   Problem: Cardiovascular: Goal: Ability to achieve and maintain adequate cardiovascular perfusion will improve Outcome: Progressing Goal: Vascular access site(s) Level 0-1 will be maintained Outcome: Progressing   Problem: Health Behavior/Discharge Planning: Goal: Ability to safely manage health-related needs after discharge will improve Outcome: Progressing   Problem: Education: Goal: Will demonstrate proper wound care and an understanding of methods to prevent future damage Outcome: Progressing Goal: Knowledge of disease or condition will improve Outcome: Progressing Goal: Knowledge of the prescribed therapeutic regimen will improve Outcome: Progressing Goal: Individualized Educational Video(s) Outcome: Progressing   Problem: Activity: Goal: Risk for activity intolerance will decrease Outcome: Progressing   Problem: Cardiac: Goal: Will achieve and/or maintain hemodynamic stability Outcome: Progressing   Problem: Clinical Measurements: Goal: Postoperative complications will be avoided or minimized Outcome: Progressing   Problem: Respiratory: Goal: Respiratory status will improve Outcome: Progressing   Problem: Skin Integrity: Goal: Wound healing without signs and symptoms of infection Outcome: Progressing Goal: Risk for impaired skin integrity will decrease Outcome: Progressing    Problem: Urinary Elimination: Goal: Ability to achieve and maintain adequate renal perfusion and functioning will improve Outcome: Progressing

## 2024-05-24 NOTE — Progress Notes (Signed)
      [address]       [place] [address]             [phone]                 1 Day Post-Op Procedure(s) (LRB): CORONARY ARTERY BYPASS GRAFTING (CABG) X THREE, USING LEFT INTERNAL MAMMARY ARTERY AND RIGHT LEG GREATER SAPHENOUS VEIN HARVESTED ENDOSCOPICALLY (N/A)   Events: No events extubated _______________________________________________________________ Vitals: BP (!) 161/69   Pulse 70   Temp 99.3 F (37.4 C)   Resp (!) 27   Ht 5' 8 (1.727 m)   Wt 83.4 kg   SpO2 98%   BMI 27.96 kg/m  Filed Weights   05/17/24 2239 05/23/24 0636 05/24/24 0500  Weight: 80.3 kg 80.3 kg 83.4 kg     - Neuro: alert NAD  - Cardiovascular: sinus   Drips: none.   CVP:  [4 mmHg-21 mmHg] 10 mmHg CO:  [3 L/min-5.4 L/min] 4.8 L/min CI:  [1.6 L/min/m2-2.8 L/min/m2] 2.5 L/min/m2  - Pulm: EWOB  ABG    Component Value Date/Time   PHART 7.308 (L) 05/23/2024 1756   PCO2ART 38.7 05/23/2024 1756   PO2ART 98 05/23/2024 1756   HCO3 19.3 (L) 05/23/2024 1756   TCO2 20 (L) 05/23/2024 1756   ACIDBASEDEF 6.0 (H) 05/23/2024 1756   O2SAT 97 05/23/2024 1756    - Abd: ND - Extremity: trace edema  .Intake/Output      10/21 0701 10/22 0700 10/22 0701 10/23 0700   I.V. (mL/kg) 2970.9 (35.6)    Blood 230    IV Piggyback 2454.6    Total Intake(mL/kg) 5655.5 (67.8)    Urine (mL/kg/hr) 4415 (2.2)    Blood 760    Chest Tube 320    Total Output 5495    Net +160.5            _______________________________________________________________ Labs:    Latest Ref Rng & Units 05/24/2024    4:57 AM 05/23/2024    6:10 PM 05/23/2024    5:56 PM  CBC  WBC 4.0 - 10.5 K/uL 7.7  8.9    Hemoglobin 13.0 - 17.0 g/dL 88.6  88.4  9.9   Hematocrit 39.0 - 52.0 % 32.7  33.7  29.0   Platelets 150 - 400 K/uL 153  156        Latest Ref Rng & Units 05/24/2024    4:57 AM 05/23/2024    6:10 PM 05/23/2024    5:56 PM  CMP  Glucose 70 - 99 mg/dL 885  880    BUN 8 - 23 mg/dL 12  14     Creatinine 9.38 - 1.24 mg/dL 8.77  8.82    Sodium 864 - 145 mmol/L 134  139  142   Potassium 3.5 - 5.1 mmol/L 4.2  4.7  4.3   Chloride 98 - 111 mmol/L 108  110    CO2 22 - 32 mmol/L 20  21    Calcium  8.9 - 10.3 mg/dL 7.7  7.7      CXR: B effusion  _______________________________________________________________  Assessment and Plan: POD 1 s/p CABG  Neuro: pain controlled CV: on A/S/BB.  Will remove A line and wires Pulm: IS, ambulation Renal: creat stable GI: on diet Heme: stable ID: afebrile Endo: SSI Dispo: continue ICU care.   Matthew Patterson 05/24/2024 7:52 AM

## 2024-05-24 NOTE — Discharge Summary (Incomplete)
 [address] [place] [address]             [phone]        Physician Discharge Summary  Patient ID: Matthew Patterson MRN: [id number] DOB/AGE: 04/24/1956 68 y.o.  Admit date: 05/17/2024 Discharge date: 05/24/2024  Admission Diagnoses:  Patient Active Problem List   Diagnosis Date Noted   S/P CABG x 3 05/23/2024   Ventilator dependence (HCC) 05/23/2024   Sinus bradycardia 05/18/2024   Acute kidney injury superimposed on chronic kidney disease 05/18/2024   Arthritis 05/18/2024   Cigar smoker 05/18/2024   Chest pain 05/17/2024   Type 2 diabetes mellitus in remission 05/21/2022   Controlled type 2 diabetes mellitus without complication, without long-term current use of insulin (HCC) 10/28/2021   Dyslipidemia 10/28/2021   Mixed hyperlipidemia 09/16/2021   Controlled diabetes mellitus type 2 with complications (HCC) 04/16/2017   Anxiety and depression 09/15/2016   Insomnia 09/15/2016   Impaired glucose tolerance 02/25/2015   Degeneration of cervical intervertebral disc 01/29/2014   Chest tightness 11/15/2012   Reflux esophagitis 02/11/2012   Routine general medical examination at a health care facility 02/11/2012   Left ankle pain 08/20/2011   OTHER ACUTE REACTIONS TO STRESS 02/10/2008   HYPERTRIGLYCERIDEMIA 10/27/2007     Discharge Diagnoses:  Patient Active Problem List   Diagnosis Date Noted   S/P CABG x 3 05/23/2024   Ventilator dependence (HCC) 05/23/2024   Sinus bradycardia 05/18/2024   Acute kidney injury superimposed on chronic kidney disease 05/18/2024   Arthritis 05/18/2024   Cigar smoker 05/18/2024   Chest pain 05/17/2024   Type 2 diabetes mellitus in remission 05/21/2022   Controlled type 2 diabetes mellitus without complication, without long-term current use of insulin (HCC) 10/28/2021   Dyslipidemia 10/28/2021   Mixed hyperlipidemia 09/16/2021   Controlled diabetes mellitus type 2 with complications (HCC) 04/16/2017    Anxiety and depression 09/15/2016   Insomnia 09/15/2016   Impaired glucose tolerance 02/25/2015   Degeneration of cervical intervertebral disc 01/29/2014   Chest tightness 11/15/2012   Reflux esophagitis 02/11/2012   Routine general medical examination at a health care facility 02/11/2012   Left ankle pain 08/20/2011   OTHER ACUTE REACTIONS TO STRESS 02/10/2008   HYPERTRIGLYCERIDEMIA 10/27/2007     Discharged Condition: {condition:18240}   History of Present Illness: At time of CT surgical consultation   we have asked to see this 68 year old male in cardiothoracic surgical surgical consultation for consideration of possible coronary artery surgical revascularization.  The patient presented to the emergency department yesterday with complaints of both shortness of breath and chest pain.  He has a significant cardiac risk profile with multiple comorbidities.  There was also some pain that did  radiate to his back as well as some dizziness.  This has been occurring for the past few weeks and are primarily noted to be with physical exertion.  Symptoms typically resolve after a few minutes of rest.  Cardiac history includes a remote diagnosis of heart murmur as well as a normal stress test in 2014 although notable he did not achieve the target heart rate.  He smokes occasional cigars and uses a occasional alcohol but none recently.  He does have a history of several family members who had significant heart disease.  His Myoview in 2014 showed normal heart function.  He was admitted to the hospital for further evaluation treatment cardiology consultation.  A chest CT with coronary  morphology did reveal findings of severe RCA and significant left main disease.  There is also at least moderate disease in the LAD and circumflex systems.  He was felt to require cardiac catheterization which was also performed on and results showed severe three-vessel coronary artery disease.  He had normal LV filling  pressures and LVEDP measured 9 mmHg, PCWP 11/9, mean 11 mmHg.  Right ventricular filling pressures were also noted to be normal.  He is not felt to be a suitable candidate for PCI.  Echocardiogram has also been performed and shows normal LVEF with ejection fraction 60 to 65%.  Right ventricular systolic function was also noted to be normal.  A small PFO cannot be excluded.  He has no significant valvular disease .  Following full review of the patient and all relevant studies he was felt to be candidate for CABG.   Hospital course: After full diagnostic evaluation and medical stabilization he was felt to be stable to proceed with surgery and on 05/23/2024 he was taken the operating room and underwent CABG x 3.  He tolerated the procedure well and was taken to the surgical intensive care unit in stable condition.   Consults: {consultation:18241}  Significant Diagnostic Studies: {diagnostics:18242}   Treatments: {Tx:18249}   Discharge Exam: Blood pressure 93/68, pulse 61, temperature 97.7 F (36.5 C), temperature source Oral, resp. rate 12, height 5' 8 (1.727 m), weight 83.4 kg, SpO2 96%. {physical [id number]   Discharge Medications:  The patient has been discharged on:   1.Beta Blocker:  Yes [   ]                              No   [   ]                              If No, reason:  2.Ace Inhibitor/ARB: Yes [   ]                                     No  [    ]                                     If No, reason:  3.Statin:   Yes [   ]                  No  [   ]                  If No, reason:  4.Ecasa:  Yes  [   ]                  No   [   ]                  If No, reason:  Patient had ACS upon admission:  Plavix/P2Y12 inhibitor: Yes [   ]                                      No  [   ]     Discharge Instructions     AMB Referral  to Cardiac Rehabilitation - Phase II   Complete by: As directed    Diagnosis: CABG   CABG X ___: 3   After initial evaluation and  assessments completed: Virtual Based Care may be provided alone or in conjunction with Phase 2 Cardiac Rehab based on patient barriers.: Yes   Intensive Cardiac Rehabilitation (ICR) [facility] location only OR Traditional Cardiac Rehabilitation (TCR) *If criteria for ICR are not met will enroll in TCR ([facility] only): Yes      Allergies as of 05/24/2024   No Known Allergies   Med Rec must be completed prior to using this [facility]***        Signed:  Lemond FORBES Cera, PA-C  05/24/2024, 1:43 PM

## 2024-05-24 NOTE — Progress Notes (Addendum)
 NAME:  Matthew Patterson, MRN:  [id number], DOB:  Apr 22, 1956, LOS: 4 ADMISSION DATE:  05/17/2024, CONSULTATION DATE:  05/24/24 REFERRING MD:  Shyrl, CHIEF COMPLAINT:  CABG    History of Present Illness:  Matthew Patterson is a 68 y.o. M with PMH significant for type 2 DM, HL, anxiety, depression and GERD who initially presented to the ED with chest and back pain with dyspnea and dizziness for two weeks.  He was bradycardic with an abnormal EKG, so was admitted to [facility] and cardiology consulted.  He underwent a CT coronary morphology which showed severe RCA and L main disease; a subsequent LHC showed severe three vessel CAD, Echo with preserved EF and normal R sided heart function.  In this setting CT surgery was consulted and opted to proceed with a CABG which pt underwent on 05/23/24.   Pertinent  Medical History   has a past medical history of Anxiety and depression, Diabetes mellitus without complication (HCC), GERD (gastroesophageal reflux disease), Hyperlipidemia, and Melanoma (HCC) (2023).   Significant Hospital Events: Including procedures, antibiotic start and stop dates in addition to other pertinent events   10/15 admit to [facility] 10/21 CABG and ICU post-op 10/22 extubated last night, off neo, progressing well   Interim History / Subjective:  CO 4.6, CI 2.4, CVP 9, SVR 1409, 320cc CT output, 4.4L UOP  Sitting up and has been OOB, complaining of upper back and neck pain (chronic) Otherwise feeling pretty well  Objective    Blood pressure 118/75, pulse 70, temperature 99.3 F (37.4 C), resp. rate 13, height 5' 8 (1.727 m), weight 83.4 kg, SpO2 100%. CVP:  [4 mmHg-21 mmHg] 9 mmHg CO:  [3 L/min-5.4 L/min] 5.2 L/min CI:  [1.6 L/min/m2-2.8 L/min/m2] 2.7 L/min/m2  Vent Mode: PSV;CPAP FiO2 (%):  [36 %-50 %] 36 % Set Rate:  [4 bmp-16 bmp] 4 bmp Vt Set:  [540 mL] 540 mL PEEP:  [5 cmH20] 5 cmH20 Pressure Support:  [10 cmH20] 10 cmH20 Plateau Pressure:  [14 cmH20] 14 cmH20    Intake/Output Summary (Last 24 hours) at 05/24/2024 9178 Last data filed at 05/24/2024 0600 Gross per 24 hour  Intake 5305.54 ml  Output 5495 ml  Net -189.46 ml   Filed Weights   05/17/24 2239 05/23/24 0636 05/24/24 0500  Weight: 80.3 kg 80.3 kg 83.4 kg    General:  well nourished elderly M sitting up in the chair in NAD HEENT: MM pink/moist, pupils equal sclera anicteric Neuro: alert and oriented  CV: s1s2 rrr, V-wires, mediastinal CT in place no m/r/g PULM:  clear bilaterally on Miller. Mediastinal CT in place, sternotomy dressing clean and dry GI: soft, bsx4 active Extremities: warm/dry, no edema     Resolved problem list   Assessment and Plan   Multivessel coronary artery disease s/p CABG x3 Carotid Stenosis, L ICA 80-99%  Continue aspirin and statin Chest tube management TCTS Closely monitor neuro exam with stenosis  Continue multimodality pain management with tramadol , oxycodone  and morphine , add robaxin Closely monitor chest tube output GDMT once able to tolerate Off Neo Plan to remove arterial line, foley and wires today Lasix 40mg  today  Acute respiratory insufficiency, postop Extubated Encourage mobilization and IS   Hypertension Holding Lisinopril  for now, on beta blocker  Hyperlipidemia Continue rosuvastatin   Diabetes type 2 Patient hemoglobin A1c is 6.1 Hold home metformin    goal 140-180, plan to transition to SSI  Expected perioperative blood loss anemia Thrombocytopenia due to CPB Monitor H/H and PLT counts  Anxiety  Hold home Trazodone    Labs   CBC: Recent Labs  Lab 05/17/24 2243 05/18/24 1638 05/19/24 0413 05/22/24 0410 05/23/24 0344 05/23/24 0801 05/23/24 1025 05/23/24 1048 05/23/24 1218 05/23/24 1308 05/23/24 1650 05/23/24 1756 05/23/24 1810 05/24/24 0457  WBC 4.7 5.1   < > 7.2 6.3  --   --   --  7.6  --   --   --  8.9 7.7  NEUTROABS 3.5 3.5  --   --   --   --   --   --   --   --   --   --   --   --   HGB 13.5 15.1    < > 14.6 15.2   < > 9.3*   < > 11.6* 9.2* 10.5* 9.9* 11.5* 11.3*  HCT 39.4 44.1   < > 41.6 43.8   < > 27.3*   < > 33.2* 27.0* 31.0* 29.0* 33.7* 32.7*  MCV 92.9 92.6   < > 90.8 92.0  --   --   --  92.5  --   --   --  93.6 93.7  PLT 176 198   < > 196 195  --  138*  --  133*  --   --   --  156 153   < > = values in this interval not displayed.    Basic Metabolic Panel: Recent Labs  Lab 05/21/24 0427 05/22/24 0410 05/23/24 0344 05/23/24 0801 05/23/24 0917 05/23/24 0944 05/23/24 1017 05/23/24 1048 05/23/24 1117 05/23/24 1120 05/23/24 1308 05/23/24 1650 05/23/24 1756 05/23/24 1810 05/24/24 0457  NA 138 137 138   < > 140   < > 140   < > 140   < > 144 142 142 139 134*  K 3.7 3.8 3.6   < > 4.1   < > 4.8   < > 4.7   < > 3.5 4.2 4.3 4.7 4.2  CL 107 106 107   < > 106  --  103  --  106  --   --   --   --  110 108  CO2 21* 19* 21*  --   --   --   --   --   --   --   --   --   --  21* 20*  GLUCOSE 133* 135* 120*   < > 132*  --  95  --  111*  --   --   --   --  119* 114*  BUN 27* 22 22   < > 21  --  18  --  19  --   --   --   --  14 12  CREATININE 1.36* 1.30* 1.29*   < > 1.10  --  1.00  --  1.00  --   --   --   --  1.17 1.22  CALCIUM  9.1 8.8* 9.0  --   --   --   --   --   --   --   --   --   --  7.7* 7.7*  MG  --  1.9  --   --   --   --   --   --   --   --   --   --   --  3.1* 2.6*   < > = values in this interval not displayed.   GFR: Estimated Creatinine Clearance: 61 mL/min (by C-G formula based  on SCr of 1.22 mg/dL). Recent Labs  Lab 05/23/24 0344 05/23/24 1218 05/23/24 1810 05/24/24 0457  WBC 6.3 7.6 8.9 7.7    Liver Function Tests: Recent Labs  Lab 05/17/24 2243  AST 23  ALT 19  ALKPHOS 34*  BILITOT 0.4  PROT 6.5  ALBUMIN 4.2   No results for input(s): LIPASE, AMYLASE in the last 168 hours. No results for input(s): AMMONIA in the last 168 hours.  ABG    Component Value Date/Time   PHART 7.308 (L) 05/23/2024 1756   PCO2ART 38.7 05/23/2024 1756   PO2ART 98  05/23/2024 1756   HCO3 19.3 (L) 05/23/2024 1756   TCO2 20 (L) 05/23/2024 1756   ACIDBASEDEF 6.0 (H) 05/23/2024 1756   O2SAT 97 05/23/2024 1756     Coagulation Profile: Recent Labs  Lab 05/22/24 0410 05/23/24 0344 05/23/24 1218  INR 1.0 1.1 1.3*    Cardiac Enzymes: No results for input(s): CKTOTAL, CKMB, CKMBINDEX, TROPONINI in the last 168 hours.  HbA1C: Hemoglobin A1C  Date/Time Value Ref Range Status  12/02/2023 07:58 AM 6.2 (A) 4.0 - 5.6 % Final  06/03/2023 07:38 AM 5.9 (A) 4.0 - 5.6 % Final   HbA1c, POC (controlled diabetic range)  Date/Time Value Ref Range Status  07/11/2021 07:11 AM 5.9 0.0 - 7.0 % Final  04/07/2018 07:11 AM 6.6 0.0 - 7.0 % Final   Hgb A1c MFr Bld  Date/Time Value Ref Range Status  05/22/2024 04:10 AM 6.1 (H) 4.8 - 5.6 % Final    Comment:    (NOTE) Diagnosis of Diabetes The following HbA1c ranges recommended by the American Diabetes Association (ADA) may be used as an aid in the diagnosis of diabetes mellitus.  Hemoglobin             Suggested A1C NGSP%              Diagnosis  <5.7                   Non Diabetic  5.7-6.4                Pre-Diabetic  >6.4                   Diabetic  <7.0                   Glycemic control for                       adults with diabetes.    04/08/2021 07:43 AM 11.9 (H) 4.6 - 6.5 % Final    Comment:    Glycemic Control Guidelines for People with Diabetes:Non Diabetic:  <6%Goal of Therapy: <7%Additional Action Suggested:  >8%     CBG: Recent Labs  Lab 05/23/24 2032 05/23/24 2313 05/24/24 0108 05/24/24 0305 05/24/24 0457  GLUCAP 125* 125* 113* 130* 123*    Review of Systems:   Unable to obtain   Past Medical History:  He,  has a past medical history of Anxiety and depression, Diabetes mellitus without complication (HCC), GERD (gastroesophageal reflux disease), Hyperlipidemia, and Melanoma (HCC) (2023).   Surgical History:   Past Surgical History:  Procedure Laterality Date    RIGHT/LEFT HEART CATH AND CORONARY ANGIOGRAPHY N/A 05/19/2024   Procedure: RIGHT/LEFT HEART CATH AND CORONARY ANGIOGRAPHY;  Surgeon: [place], Peter M, MD;  Location: [facility] INVASIVE CV LAB;  Service: Cardiovascular;  Laterality: N/A;   ROTATOR CUFF REPAIR     SHOULDER SURGERY  2010   B/L     Social History:   reports that he has been smoking cigarettes and cigars. He started smoking about 15 years ago. He has never used smokeless tobacco. He reports current alcohol use of about 1.0 - 2.0 standard drink of alcohol per week. He reports that he does not use drugs.   Family History:  His family history includes Cancer in his sister; Diabetes in his mother; Hypertension in his mother.   Allergies No Known Allergies   Home Medications  Prior to Admission medications   Medication Sig Start Date End Date Taking? Authorizing Provider  fenofibrate  (TRICOR ) 145 MG tablet TAKE 1 TABLET BY MOUTH DAILY 06/23/23  Yes Nafziger, Darleene, NP  lisinopril  (ZESTRIL ) 2.5 MG tablet TAKE 1 TABLET BY MOUTH DAILY 03/22/24  Yes Nafziger, Darleene, NP  metFORMIN  (GLUCOPHAGE -XR) 750 MG 24 hr tablet Take 1 tablet (750 mg total) by mouth daily with breakfast. 12/02/23  Yes Shamleffer, Ibtehal Jaralla, MD  rosuvastatin  (CRESTOR ) 40 MG tablet Take 1 tablet (40 mg total) by mouth daily. 12/06/23  Yes Shamleffer, Ibtehal Jaralla, MD  traZODone  (DESYREL ) 100 MG tablet TAKE 1 TABLET BY MOUTH AT BEDTIME 12/16/23  Yes Nafziger, Darleene, NP  Blood Glucose Monitoring Suppl (ONETOUCH VERIO IQ SYSTEM) w/Device KIT 1 kit 12/29/16   Nafziger, Darleene, NP  Continuous Glucose Sensor (FREESTYLE LIBRE 3 PLUS SENSOR) MISC 1 Device by Other route every 14 (fourteen) days. Change sensor every 15 days. 06/03/23   Shamleffer, Ibtehal Jaralla, MD  glucose blood test strip Use as instructed 12/29/16   Merna Darleene, NP  Lancets [facility] ULTRASOFT) lancets Use as instructed 12/29/16   Merna Darleene, NP     Critical care time:  n/a        Leita SAUNDERS Vella Colquitt,  PA-C [facility] See Amion for pager If no response to pager , please call [phone] until 7pm After 7:00 pm call Elink  [phone]

## 2024-05-25 ENCOUNTER — Inpatient Hospital Stay (HOSPITAL_COMMUNITY)

## 2024-05-25 DIAGNOSIS — R079 Chest pain, unspecified: Secondary | ICD-10-CM | POA: Diagnosis not present

## 2024-05-25 DIAGNOSIS — Z951 Presence of aortocoronary bypass graft: Secondary | ICD-10-CM | POA: Diagnosis not present

## 2024-05-25 DIAGNOSIS — I10 Essential (primary) hypertension: Secondary | ICD-10-CM | POA: Diagnosis not present

## 2024-05-25 DIAGNOSIS — E119 Type 2 diabetes mellitus without complications: Secondary | ICD-10-CM | POA: Diagnosis not present

## 2024-05-25 LAB — BASIC METABOLIC PANEL WITH GFR
Anion gap: 8 (ref 5–15)
BUN: 14 mg/dL (ref 8–23)
CO2: 25 mmol/L (ref 22–32)
Calcium: 8.2 mg/dL — ABNORMAL LOW (ref 8.9–10.3)
Chloride: 101 mmol/L (ref 98–111)
Creatinine, Ser: 1.23 mg/dL (ref 0.61–1.24)
GFR, Estimated: >60 mL/min (ref >60)
Glucose, Bld: 123 mg/dL — ABNORMAL HIGH (ref 70–99)
Potassium: 4.3 mmol/L (ref 3.5–5.1)
Sodium: 134 mmol/L — ABNORMAL LOW (ref 135–145)

## 2024-05-25 LAB — CBC
HCT: 33.6 % — ABNORMAL LOW (ref 39.0–52.0)
Hemoglobin: 11.2 g/dL — ABNORMAL LOW (ref 13.0–17.0)
MCH: 31.9 pg (ref 26.0–34.0)
MCHC: 33.3 g/dL (ref 30.0–36.0)
MCV: 95.7 fL (ref 80.0–100.0)
Platelets: 160 K/uL (ref 150–400)
RBC: 3.51 MIL/uL — ABNORMAL LOW (ref 4.22–5.81)
RDW: 12.9 % (ref 11.5–15.5)
WBC: 8.6 K/uL (ref 4.0–10.5)
nRBC: 0 % (ref 0.0–0.2)

## 2024-05-25 LAB — GLUCOSE, CAPILLARY
Glucose-Capillary: 124 mg/dL — ABNORMAL HIGH (ref 70–99)
Glucose-Capillary: 126 mg/dL — ABNORMAL HIGH (ref 70–99)
Glucose-Capillary: 127 mg/dL — ABNORMAL HIGH (ref 70–99)
Glucose-Capillary: 146 mg/dL — ABNORMAL HIGH (ref 70–99)
Glucose-Capillary: 150 mg/dL — ABNORMAL HIGH (ref 70–99)

## 2024-05-25 MED ORDER — SODIUM CHLORIDE 0.9 % IV SOLN: 250.0000 mL | INTRAVENOUS | Status: AC | PRN

## 2024-05-25 MED ORDER — POLYETHYLENE GLYCOL 3350 17 G PO PACK
17.0000 g | PACK | Freq: Two times a day (BID) | ORAL | Status: DC
  Administered 2024-05-25 – 2024-05-27 (×4): 17 g via ORAL
  Filled 2024-05-25 (×6): qty 1

## 2024-05-25 MED ORDER — FENTANYL CITRATE (PF) 50 MCG/ML IJ SOSY: 25.0000 ug | PREFILLED_SYRINGE | Freq: Once | INTRAMUSCULAR | Status: DC

## 2024-05-25 MED ORDER — AMIODARONE HCL IN DEXTROSE 360-4.14 MG/200ML-% IV SOLN
30.0000 mg/h | INTRAVENOUS | Status: DC
  Administered 2024-05-25 – 2024-05-26 (×2): 30 mg/h via INTRAVENOUS
  Filled 2024-05-25: qty 200

## 2024-05-25 MED ORDER — AMIODARONE HCL IN DEXTROSE 360-4.14 MG/200ML-% IV SOLN
60.0000 mg/h | INTRAVENOUS | Status: AC
  Administered 2024-05-25: 60 mg/h via INTRAVENOUS
  Filled 2024-05-25 (×2): qty 200

## 2024-05-25 MED ORDER — SODIUM CHLORIDE 0.9% FLUSH: 3.0000 mL | INTRAVENOUS | Status: DC | PRN

## 2024-05-25 MED ORDER — SODIUM CHLORIDE 0.9% FLUSH
3.0000 mL | Freq: Two times a day (BID) | INTRAVENOUS | Status: DC
  Administered 2024-05-25: 10 mL via INTRAVENOUS
  Administered 2024-05-25 – 2024-05-28 (×4): 3 mL via INTRAVENOUS

## 2024-05-25 MED ORDER — ~~LOC~~ CARDIAC SURGERY, PATIENT & FAMILY EDUCATION: Freq: Once | Status: AC

## 2024-05-25 MED ORDER — INSULIN ASPART 100 UNIT/ML IJ SOLN
0.0000 [IU] | Freq: Three times a day (TID) | INTRAMUSCULAR | Status: DC
  Administered 2024-05-25 – 2024-05-27 (×6): 2 [IU] via SUBCUTANEOUS

## 2024-05-25 NOTE — Progress Notes (Signed)
 NAME:  Matthew Patterson, MRN:  [id number], DOB:  1955-12-23, LOS: 5 ADMISSION DATE:  05/17/2024, CONSULTATION DATE:  05/25/24 REFERRING MD:  Shyrl, CHIEF COMPLAINT:  CABG    History of Present Illness:  Matthew Patterson is a 68 y.o. M with PMH significant for type 2 DM, HL, anxiety, depression and GERD who initially presented to the ED with chest and back pain with dyspnea and dizziness for two weeks.  He was bradycardic with an abnormal EKG, so was admitted to [facility] and cardiology consulted.  He underwent a CT coronary morphology which showed severe RCA and L main disease; a subsequent LHC showed severe three vessel CAD, Echo with preserved EF and normal R sided heart function.  In this setting CT surgery was consulted and opted to proceed with a CABG which pt underwent on 05/23/24.   Pertinent  Medical History   has a past medical history of Anxiety and depression, Diabetes mellitus without complication (HCC), GERD (gastroesophageal reflux disease), Hyperlipidemia, and Melanoma (HCC) (2023).   Significant Hospital Events: Including procedures, antibiotic start and stop dates in addition to other pertinent events   10/15 admit to [facility] 10/21 CABG and ICU post-op 10/22 extubated last night, off neo, progressing well  10/23 ambulating, plan to remove chest tubes and transfer to floor  Interim History / Subjective:  2.3L UOP 120cc CT output  Renal function stable, ambulating, feels well other than continued upper back and neck msk discomfort   Objective    Blood pressure 114/78, pulse 67, temperature 98.4 F (36.9 C), temperature source Oral, resp. rate (!) 26, height 5' 8 (1.727 m), weight 82.7 kg, SpO2 96%. CVP:  [7 mmHg-8 mmHg] 7 mmHg CO:  [5 L/min-6.1 L/min] 5.4 L/min CI:  [2.6 L/min/m2-3.1 L/min/m2] 2.8 L/min/m2      Intake/Output Summary (Last 24 hours) at 05/25/2024 1007 Last data filed at 05/25/2024 0700 Gross per 24 hour  Intake 997.89 ml  Output 1845 ml  Net -847.11 ml    Filed Weights   05/23/24 0636 05/24/24 0500 05/25/24 0500  Weight: 80.3 kg 83.4 kg 82.7 kg    General:  well nourished elderly M sitting up in the chair in NAD HEENT: MM pink/moist, pupils equal sclera anicteric Neuro: alert and oriented  CV: s1s2 rrr,  mediastinal CT in place no m/r/g, dressing clean and dry  PULM:  clear bilaterally on [place]. Mediastinal CT in place, sternotomy dressing clean and dry GI: soft, bsx4 active Extremities: warm/dry, no edema     Resolved problem list   Assessment and Plan   Multivessel coronary artery disease s/p CABG x3 Carotid Stenosis, L ICA 80-99%  Continue aspirin and statin Chest tube management TCTS, plan to remove today  Continue multimodality pain management with tramadol , oxycodone  and morphine , robaxin GDMT once able to tolerate Diurese prn  Acute respiratory insufficiency, postop Encourage IS and ambulation    Hypertension Holding Lisinopril  for now, on beta blocker  Hyperlipidemia Continue rosuvastatin   Diabetes type 2 Patient hemoglobin A1c is 6.1 Hold home metformin    goal 140-180, continue SSI  Expected perioperative blood loss anemia Thrombocytopenia due to CPB Monitor H/H and PLT counts   Anxiety  Hold home Trazodone    Labs   CBC: Recent Labs  Lab 05/18/24 1638 05/19/24 0413 05/23/24 1218 05/23/24 1308 05/23/24 1756 05/23/24 1810 05/24/24 0457 05/24/24 1507 05/25/24 0454  WBC 5.1   < > 7.6  --   --  8.9 7.7 10.4 8.6  NEUTROABS 3.5  --   --   --   --   --   --   --   --  HGB 15.1   < > 11.6*   < > 9.9* 11.5* 11.3* 12.0* 11.2*  HCT 44.1   < > 33.2*   < > 29.0* 33.7* 32.7* 35.8* 33.6*  MCV 92.6   < > 92.5  --   --  93.6 93.7 95.5 95.7  PLT 198   < > 133*  --   --  156 153 190 160   < > = values in this interval not displayed.    Basic Metabolic Panel: Recent Labs  Lab 05/22/24 0410 05/23/24 0344 05/23/24 0801 05/23/24 1117 05/23/24 1120 05/23/24 1756 05/23/24 1810 05/24/24 0457  05/24/24 1507 05/25/24 0454  NA 137 138   < > 140   < > 142 139 134* 132* 134*  K 3.8 3.6   < > 4.7   < > 4.3 4.7 4.2 4.3 4.3  CL 106 107   < > 106  --   --  110 108 101 101  CO2 19* 21*  --   --   --   --  21* 20* 24 25  GLUCOSE 135* 120*   < > 111*  --   --  119* 114* 163* 123*  BUN 22 22   < > 19  --   --  14 12 14 14   CREATININE 1.30* 1.29*   < > 1.00  --   --  1.17 1.22 1.41* 1.23  CALCIUM  8.8* 9.0  --   --   --   --  7.7* 7.7* 7.9* 8.2*  MG 1.9  --   --   --   --   --  3.1* 2.6* 2.5*  --    < > = values in this interval not displayed.   GFR: Estimated Creatinine Clearance: 60.2 mL/min (by C-G formula based on SCr of 1.23 mg/dL). Recent Labs  Lab 05/23/24 1810 05/24/24 0457 05/24/24 1507 05/25/24 0454  WBC 8.9 7.7 10.4 8.6    Liver Function Tests: No results for input(s): AST, ALT, ALKPHOS, BILITOT, PROT, ALBUMIN in the last 168 hours.  No results for input(s): LIPASE, AMYLASE in the last 168 hours. No results for input(s): AMMONIA in the last 168 hours.  ABG    Component Value Date/Time   PHART 7.308 (L) 05/23/2024 1756   PCO2ART 38.7 05/23/2024 1756   PO2ART 98 05/23/2024 1756   HCO3 19.3 (L) 05/23/2024 1756   TCO2 20 (L) 05/23/2024 1756   ACIDBASEDEF 6.0 (H) 05/23/2024 1756   O2SAT 97 05/23/2024 1756     Coagulation Profile: Recent Labs  Lab 05/22/24 0410 05/23/24 0344 05/23/24 1218  INR 1.0 1.1 1.3*    Cardiac Enzymes: No results for input(s): CKTOTAL, CKMB, CKMBINDEX, TROPONINI in the last 168 hours.  HbA1C: Hemoglobin A1C  Date/Time Value Ref Range Status  12/02/2023 07:58 AM 6.2 (A) 4.0 - 5.6 % Final  06/03/2023 07:38 AM 5.9 (A) 4.0 - 5.6 % Final   HbA1c, POC (controlled diabetic range)  Date/Time Value Ref Range Status  07/11/2021 07:11 AM 5.9 0.0 - 7.0 % Final  04/07/2018 07:11 AM 6.6 0.0 - 7.0 % Final   Hgb A1c MFr Bld  Date/Time Value Ref Range Status  05/22/2024 04:10 AM 6.1 (H) 4.8 - 5.6 % Final     Comment:    (NOTE) Diagnosis of Diabetes The following HbA1c ranges recommended by the American Diabetes Association (ADA) may be used as an aid in the diagnosis of diabetes mellitus.  Hemoglobin  Suggested A1C NGSP%              Diagnosis  <5.7                   Non Diabetic  5.7-6.4                Pre-Diabetic  >6.4                   Diabetic  <7.0                   Glycemic control for                       adults with diabetes.    04/08/2021 07:43 AM 11.9 (H) 4.6 - 6.5 % Final    Comment:    Glycemic Control Guidelines for People with Diabetes:Non Diabetic:  <6%Goal of Therapy: <7%Additional Action Suggested:  >8%     CBG: Recent Labs  Lab 05/24/24 1625 05/24/24 2024 05/25/24 0026 05/25/24 0344 05/25/24 0805  GLUCAP 147* 157* 127* 124* 150*    Review of Systems:   Unable to obtain   Past Medical History:  He,  has a past medical history of Anxiety and depression, Diabetes mellitus without complication (HCC), GERD (gastroesophageal reflux disease), Hyperlipidemia, and Melanoma (HCC) (2023).   Surgical History:   Past Surgical History:  Procedure Laterality Date   CORONARY ARTERY BYPASS GRAFT N/A 05/23/2024   Procedure: CORONARY ARTERY BYPASS GRAFTING (CABG) X THREE, USING LEFT INTERNAL MAMMARY ARTERY AND RIGHT LEG GREATER SAPHENOUS VEIN HARVESTED ENDOSCOPICALLY;  Surgeon: Shyrl Linnie KIDD, MD;  Location: [facility] OR;  Service: Open Heart Surgery;  Laterality: N/A;   RIGHT/LEFT HEART CATH AND CORONARY ANGIOGRAPHY N/A 05/19/2024   Procedure: RIGHT/LEFT HEART CATH AND CORONARY ANGIOGRAPHY;  Surgeon: [place]az[place], Peter M, MD;  Location: [facility] INVASIVE CV LAB;  Service: Cardiovascular;  Laterality: N/A;   ROTATOR CUFF REPAIR     SHOULDER SURGERY  2010   B/L     Social History:   reports that he has been smoking cigarettes and cigars. He started smoking about 15 years ago. He has never used smokeless tobacco. He reports current alcohol use of about 1.0 - 2.0  standard drink of alcohol per week. He reports that he does not use drugs.   Family History:  His family history includes Cancer in his sister; Diabetes in his mother; Hypertension in his mother.   Allergies No Known Allergies   Home Medications  Prior to Admission medications   Medication Sig Start Date End Date Taking? Authorizing Provider  fenofibrate  (TRICOR ) 145 MG tablet TAKE 1 TABLET BY MOUTH DAILY 06/23/23  Yes Nafziger, Darleene, NP  lisinopril  (ZESTRIL ) 2.5 MG tablet TAKE 1 TABLET BY MOUTH DAILY 03/22/24  Yes Nafziger, Darleene, NP  metFORMIN  (GLUCOPHAGE -XR) 750 MG 24 hr tablet Take 1 tablet (750 mg total) by mouth daily with breakfast. 12/02/23  Yes Shamleffer, Ibtehal Jaralla, MD  rosuvastatin  (CRESTOR ) 40 MG tablet Take 1 tablet (40 mg total) by mouth daily. 12/06/23  Yes Shamleffer, Ibtehal Jaralla, MD  traZODone  (DESYREL ) 100 MG tablet TAKE 1 TABLET BY MOUTH AT BEDTIME 12/16/23  Yes Nafziger, Darleene, NP  Blood Glucose Monitoring Suppl (ONETOUCH VERIO IQ SYSTEM) w/Device KIT 1 kit 12/29/16   Nafziger, Darleene, NP  Continuous Glucose Sensor (FREESTYLE LIBRE 3 PLUS SENSOR) MISC 1 Device by Other route every 14 (fourteen) days. Change sensor every 15 days. 06/03/23   Shamleffer, Ibtehal Jaralla,  MD  glucose blood test strip Use as instructed 12/29/16   Nafziger, Cory, NP  Lancets [facility] ULTRASOFT) lancets Use as instructed 12/29/16   Merna Huxley, NP     Critical care time:  n/a        Leita SAUNDERS Kinsey Cowsert, PA-C [name] See Amion for pager If no response to pager , please call [phone] until 7pm After 7:00 pm call Elink  [phone]

## 2024-05-25 NOTE — Discharge Summary (Signed)
 [address] [place] [address]             [phone]        Physician Discharge Summary  Patient ID: Matthew Patterson MRN: [id number] DOB/AGE: [birth year] 68 y.o.  Admit date: 05/17/2024 Discharge date: 05/28/2024  Admission Diagnoses:  Patient Active Problem List   Diagnosis Date Noted   S/P CABG x 3 05/23/2024   Ventilator dependence (HCC) 05/23/2024   Sinus bradycardia 05/18/2024   Acute kidney injury superimposed on chronic kidney disease 05/18/2024   Arthritis 05/18/2024   Cigar smoker 05/18/2024   Chest pain 05/17/2024   Type 2 diabetes mellitus in remission 05/21/2022   Controlled type 2 diabetes mellitus without complication, without long-term current use of insulin (HCC) 10/28/2021   Dyslipidemia 10/28/2021   Mixed hyperlipidemia 09/16/2021   Controlled diabetes mellitus type 2 with complications (HCC) 04/16/2017   Anxiety and depression 09/15/2016   Insomnia 09/15/2016   Impaired glucose tolerance 02/25/2015   Degeneration of cervical intervertebral disc 01/29/2014   Chest tightness 11/15/2012   Reflux esophagitis 02/11/2012   Routine general medical examination at a health care facility 02/11/2012   Left ankle pain 08/20/2011   OTHER ACUTE REACTIONS TO STRESS 02/10/2008   HYPERTRIGLYCERIDEMIA 10/27/2007     Discharge Diagnoses:  Patient Active Problem List   Diagnosis Date Noted   S/P CABG x 3 05/23/2024   Ventilator dependence (HCC) 05/23/2024   Sinus bradycardia 05/18/2024   Acute kidney injury superimposed on chronic kidney disease 05/18/2024   Arthritis 05/18/2024   Cigar smoker 05/18/2024   Chest pain 05/17/2024   Type 2 diabetes mellitus in remission 05/21/2022   Controlled type 2 diabetes mellitus without complication, without long-term current use of insulin (HCC) 10/28/2021   Dyslipidemia 10/28/2021   Mixed hyperlipidemia 09/16/2021   Controlled diabetes mellitus type 2 with complications (HCC)  04/16/2017   Anxiety and depression 09/15/2016   Insomnia 09/15/2016   Impaired glucose tolerance 02/25/2015   Degeneration of cervical intervertebral disc 01/29/2014   Chest tightness 11/15/2012   Reflux esophagitis 02/11/2012   Routine general medical examination at a health care facility 02/11/2012   Left ankle pain 08/20/2011   OTHER ACUTE REACTIONS TO STRESS 02/10/2008   HYPERTRIGLYCERIDEMIA 10/27/2007     Discharged Condition: stable   History of Present Illness: At time of CT surgical consultation   we have asked to see this 68 year old male in cardiothoracic surgical surgical consultation for consideration of possible coronary artery surgical revascularization.  The patient presented to the emergency department yesterday with complaints of both shortness of breath and chest pain.  He has a significant cardiac risk profile with multiple comorbidities.  There was also some pain that did  radiate to his back as well as some dizziness.  This has been occurring for the past few weeks and are primarily noted to be with physical exertion.  Symptoms typically resolve after a few minutes of rest.  Cardiac history includes a remote diagnosis of heart murmur as well as a normal stress test in 2014 although notable he did not achieve the target heart rate.  He smokes occasional cigars and uses a occasional alcohol but none recently.  He does have a history of several family members who had significant heart disease.  His Myoview in 2014 showed normal heart function.  He was admitted to the hospital for further evaluation treatment cardiology consultation.  A chest  CT with coronary morphology did reveal findings of severe RCA and significant left main disease.  There is also at least moderate disease in the LAD and circumflex systems.  He was felt to require cardiac catheterization which was also performed on and results showed severe three-vessel coronary artery disease.  He had normal LV filling  pressures and LVEDP measured 9 mmHg, PCWP 11/9, mean 11 mmHg.  Right ventricular filling pressures were also noted to be normal.  He is not felt to be a suitable candidate for PCI.  Echocardiogram has also been performed and shows normal LVEF with ejection fraction 60 to 65%.  Right ventricular systolic function was also noted to be normal.  A small PFO cannot be excluded.  He has no significant valvular disease .  Following full review of the patient and all relevant studies he was felt to be candidate for CABG.   Hospital Course: After full diagnostic evaluation and medical stabilization he was felt to be stable to proceed with surgery and on 05/23/2024 he was taken the operating room and underwent CABG x 3.  A LIMA-LAD, SVG-OM1 and SVG-OM 2's were placed.  He tolerated the procedure well and was taken to the surgical intensive care unit in stable condition.  Postoperative Hospital Course:  Patient was weaned from the ventilator using standard postcardiac surgical protocols without difficulty.  He has remained hemodynamically stable.  All routine lines, monitors and drainage devices have been discontinued in standard fashion.  Renal function has remained normal.  He does have expected acute blood loss anemia which is mild and being monitored.  Chest tubes were removed on postop day #2.  He does have an expected acute blood loss anemia and thrombocytopenia due to cardiopulmonary bypass which is being monitored clinically.  Values have stabilized and improved.  He did develop postoperative atrial fibrillation and was placed on an amiodarone drip with subsequent transition to oral dosing.  He has chemically cardioverted to sinus rhythm.  Oxygen has been weaned and he maintains good saturations on room air.  Incisions are noted to be healing well without evidence of infection.  He is tolerating diet.  He is tolerating gradually increasing activities and is now independent with mobility and transfers.   Incisions are healing with no sign of complication.  He has not had any further atrial fibrillation.  He felt he was ready to return home on postop day 5.  Consults: pulmonary/intensive care  Significant Diagnostic Studies:  CT CORONARY MORPH W/CTA COR W/SCORE W/CA W/CM &/OR WO/CM Addendum Date: 05/26/2024 ADDENDUM REPORT: 05/26/2024 15:18 EXAM: OVER-READ INTERPRETATION  CT CHEST The following report is an over-read performed by radiologist Dr. Andrea Gasman of [facility] on 05/26/2024. This over-read does not include interpretation of cardiac or coronary anatomy or pathology. The coronary CTA interpretation by the cardiologist is attached. COMPARISON:  None. FINDINGS: Vascular: Aortic atherosclerosis. The included aorta is normal in caliber. Mediastinum/nodes: No adenopathy or mass. Unremarkable esophagus. Lungs: No focal airspace disease. Small perifissural lymph nodes in the right lower lobe, need no further imaging follow-up. No pleural fluid. The included airways are patent. Upper abdomen: No acute or unexpected findings. Musculoskeletal: There are no acute or suspicious osseous abnormalities. Incidental bone island within midthoracic vertebra. IMPRESSION: Aortic Atherosclerosis (ICD10-I70.0). Electronically Signed   By: Andrea Gasman M.D.   On: 05/26/2024 15:18   Result Date: 05/26/2024 CLINICAL DATA:  59M with hypertension, hyperlipidemia, diabetes and chest pain. EXAM: Cardiac/Coronary  CT TECHNIQUE: The patient was scanned on  a [employer]. PROTOCOL: A 120 kV prospective scan was triggered in the descending thoracic aorta at 111 HU's. Axial non-contrast 3 mm slices were carried out through the heart. The data set was analyzed on a dedicated work station and scored using the Agatson method. Gantry rotation speed was 250 msecs and collimation was .6 mm. No beta blockade and 0.8 mg of sl NTG was given. The 3D data set was reconstructed in 5% intervals of the 67-82 % of  the R-R cycle. Diastolic phases were analyzed on a dedicated work station using MPR, MIP and VRT modes. The patient received 80 cc of contrast. FINDINGS: Aorta: Normal size.  Aortic atherosclerosis.   No dissection. Aortic Valve:  Trileaflet.  No calcifications. Coronary Arteries:  Normal coronary origin.  Right dominance. RCA is a small non-dominant artery that gives rise to PDA and PLVB. There is 100% occlusion at the ostium and severe (>70%) mixed plaque in the proximal RCA. Left main is a large artery that gives rise to LAD and LCX arteries. There is minimal (<25%) calcified plaque. LAD is a large vessel that has extensive mixed plaque. There is moderate (50-69%) mixed plaque in the ostium and severe (>70%) mixed plaque in the mid and distal LAD. D1 and D2 are small vessels that are heavily diseased. LCX is a dominant artery with moderate (50-69%) mixed plaque in the proximal and distal vessel. There is severe (>70%) stenosis in L-PDA. There is severe (>70%) stenosis in OM1. There is severe (>70%) stenosis at the ostium of OM2. Coronary Calcium  Score: Left main: 112 Left anterior descending artery: 1007 Left circumflex artery: 747 Right coronary artery: 77.9 Total: 1944 Percentile: 95th Other findings: Normal pulmonary vein drainage into the left atrium. Normal let atrial appendage without a thrombus. Normal size of the pulmonary artery. Non-cardiac: See separate report from [facility]. IMPRESSION: 1. Coronary calcium  score of 1944. This was 95th percentile for age-, sex, and race-matched controls. 2. Normal coronary origin with left dominance. 3. There is 100% occlusion at the ostium and severe (>70%) mixed plaque in the proximal RCA. There is severe (>70%) stenosis in the LAD, OM1, OM2 and L-PDA. There is moderate (50-69%) stenosis in the proximal and distal LAD. CAD-RADS 5. 4.  Willl send for FFRct. 5.  Recommend cardiac catheterization. RECOMMENDATIONS: CAD-RADS 5: Total coronary occlusion (100%).  Consider cardiac catheterization or viability assessment. Consider symptom-guided anti-ischemic pharmacotherapy as well as risk factor modification per guideline directed care. Annabella Scarce, MD Electronically Signed: By: Annabella Scarce M.D. On: 05/19/2024 12:42   DG Chest Port 1 View Result Date: 05/25/2024 CLINICAL DATA:  [id number] Pneumothorax [id number] EXAM: PORTABLE CHEST - 1 VIEW COMPARISON:  05/24/2024 FINDINGS: Right IJ approach introducer sheath with a pulmonary artery catheter in place, terminating in the lower SVC. Similar low lung volumes with small left pleural effusion and streaky left basilar atelectasis. Trace right pleural effusion. No pneumothorax. Left-sided thoracostomy tube is similarly positioned. Mediastinal drain also noted. Mild cardiomegaly. Sternotomy wires and CABG markers. IMPRESSION: 1. No significant interval change to the lungs. 2. Similarly positioned support tubes and lines, as delineated above. Electronically Signed   By: Rogelia Myers M.D.   On: 05/25/2024 09:13   DG Chest Port 1 View Result Date: 05/24/2024 EXAM: 1 VIEW(S) XRAY OF THE CHEST 05/24/2024 05:22:00 AM COMPARISON: 05/23/2024 CLINICAL HISTORY: S/P CABG x 3 [id number]. Reason for exam: S/P CABG x 3 FINDINGS: LINES, TUBES AND DEVICES: Right IJ central venous catheter with distal tip in mid  SVC. Mediastinal drain and left chest tube in place. . LUNGS AND PLEURA: Low lung volumes. Increased bilateral pleural effusions, left greater than right. Persistent bibasilar atelectasis. No pulmonary edema. No pneumothorax. HEART AND MEDIASTINUM: Post-CABG changes. No acute abnormality of the cardiac and mediastinal silhouettes. BONES AND SOFT TISSUES: Intact sternotomy wires. No acute osseous abnormality. IMPRESSION: 1. Increased bilateral pleural effusions, left greater than right, with worsened bibasilar atelectasis. 2. No pneumothorax. Electronically signed by: Waddell Calk MD 05/24/2024 06:58 AM EDT RP Workstation:  [id number]   DG Chest Port 1 View Result Date: 05/23/2024 CLINICAL DATA:  [id number] S/P CABG x 3 [id number] EXAM: PORTABLE CHEST - 1 VIEW COMPARISON:  05/17/2024 FINDINGS: Endotracheal tube terminates in the mid trachea. Esophagogastric tube terminates in the stomach. Right IJ approach pulmonary artery catheter terminates in the mid SVC with an introducer sheath noted. Mediastinal and left-sided thoracostomy tubes in place. Low lung volumes. Streaky left basilar atelectasis with trace left pleural effusion. No pneumothorax. Mild cardiomegaly. Sternotomy wires and CABG markers. IMPRESSION: 1. Low lung volumes. Small left pleural effusion with left basilar atelectasis. 2. Well positioned support tubes and lines, as delineated above. Electronically Signed   By: Rogelia Myers M.D.   On: 05/23/2024 14:40   ECHO INTRAOPERATIVE TEE Result Date: 05/23/2024  *INTRAOPERATIVE TRANSESOPHAGEAL REPORT *  Patient Name:   Devlon P [facility] Date of Exam: 05/23/2024 Medical Rec #:  [id number]       Height:       68.0 in Accession #:    [id number]      Weight:       177.0 lb Date of Birth:  [birth year]       BSA:          1.94 m Patient Age:    68 years        BP:           118/86 mmHg Patient Gender: M               HR:           62 bpm. Exam Location:  Anesthesiology Transesophogeal exam was perform intraoperatively during surgical procedure. Patient was closely monitored under general anesthesia during the entirety of examination. Indications:     I25.110 Atherosclerotic heart disease of native coronary artery                  with unstable angina pectoris Performing Phys: Debby Like MD Diagnosing Phys: Debby Like MD Complications: No known complications during this procedure. POST-OP IMPRESSIONS _ Left Ventricle: The left ventricle is unchanged from pre-bypass. _ Right Ventricle: The right ventricle appears unchanged from pre-bypass. _ Aorta: The aorta appears unchanged from pre-bypass. _ Left Atrium: The left atrium appears  unchanged from pre-bypass. _ Left Atrial Appendage: The left atrial appendage appears unchanged from pre-bypass. _ Aortic Valve: The aortic valve appears unchanged from pre-bypass. _ Mitral Valve: The mitral valve appears unchanged from pre-bypass. _ Tricuspid Valve: The tricuspid valve appears unchanged from pre-bypass. _ Pulmonic Valve: The pulmonic valve appears unchanged from pre-bypass. _ Interatrial Septum: The interatrial septum appears unchanged from pre-bypass. _ Interventricular Septum: The interventricular septum appears unchanged from pre-bypass. _ Pericardium: The pericardium appears unchanged from pre-bypass. PRE-OP FINDINGS  Left Ventricle: The left ventricle has normal systolic function, with an ejection fraction of 60-65%. The cavity size was normal. No evidence of left ventricular regional wall motion abnormalities. There is no left ventricular hypertrophy. Right Ventricle: The right ventricle has normal systolic function. The  cavity was normal. There is no increase in right ventricular wall thickness. Left Atrium: Left atrial size was dilated. Right Atrium: Right atrial size was normal in size. Interatrial Septum: No atrial level shunt detected by color flow Doppler. Pericardium: There is no evidence of pericardial effusion. Mitral Valve: The mitral valve is normal in structure. Mitral valve regurgitation is mild by color flow Doppler. There is No evidence of mitral stenosis. Tricuspid Valve: The tricuspid valve was normal in structure. Tricuspid valve regurgitation is trivial by color flow Doppler. Aortic Valve: The aortic valve is tricuspid Aortic valve regurgitation was not visualized by color flow Doppler. There is no stenosis of the aortic valve. Pulmonic Valve: The pulmonic valve was not assessed. Pulmonic valve regurgitation was not assessed by color flow Doppler. Aorta: The aortic root, ascending aorta and aortic arch are normal in size and structure.  Debby Like MD Electronically signed  by Debby Like MD Signature Date/Time: 05/23/2024/2:31:54 PM    Final    VAS US  DOPPLER PRE CABG Result Date: 05/22/2024 PREOPERATIVE VASCULAR EVALUATION Patient Name:  Rylyn P [facility]  Date of Exam:   05/22/2024 Medical Rec #: [id number]        Accession #:    [id number] Date of Birth: 02-22-1956        Patient Gender: M Patient Age:   54 years Exam Location:  [facility] Procedure:      VAS US  DOPPLER PRE CABG Referring Phys: WAYNE GOLD --------------------------------------------------------------------------------  Indications:      Pre-CABG. Risk Factors:     Hypertension, hyperlipidemia, Diabetes, current smoker                   (occasional cigar), coronary artery disease. Limitations:      Mechanical interference with ABI Doppler signal Comparison Study: No prior study on file Performing Technologist: Rachel Pellet RVS  Examination Guidelines: A complete evaluation includes B-mode imaging, spectral Doppler, color Doppler, and power Doppler as needed of all accessible portions of each vessel. Bilateral testing is considered an integral part of a complete examination. Limited examinations for reoccurring indications may be performed as noted.  Right Carotid Findings: +----------+--------+--------+--------+------------+------------------+           PSV cm/sEDV cm/sStenosisDescribe    Comments           +----------+--------+--------+--------+------------+------------------+ CCA Prox  81      18                          intimal thickening +----------+--------+--------+--------+------------+------------------+ CCA Distal93      21                          intimal thickening +----------+--------+--------+--------+------------+------------------+ ICA Prox  95      21              heterogenous                   +----------+--------+--------+--------+------------+------------------+ ICA Mid   122     33                                              +----------+--------+--------+--------+------------+------------------+ ICA Distal79      25                                             +----------+--------+--------+--------+------------+------------------+  ECA       134     22                                             +----------+--------+--------+--------+------------+------------------+ +----------+--------+-------+--------+------------+           PSV cm/sEDV cmsDescribeArm Pressure +----------+--------+-------+--------+------------+ Subclavian147                    113          +----------+--------+-------+--------+------------+ +---------+--------+--------+----------+ VertebralPSV cm/sEDV cm/sRetrograde +---------+--------+--------+----------+ Left Carotid Findings: +----------+--------+--------+--------+------------+------------------+           PSV cm/sEDV cm/sStenosisDescribe    Comments           +----------+--------+--------+--------+------------+------------------+ CCA Prox  88      16                          intimal thickening +----------+--------+--------+--------+------------+------------------+ CCA Distal124     21              heterogenous                   +----------+--------+--------+--------+------------+------------------+ ICA Prox  424     169     80-99%  calcific                       +----------+--------+--------+--------+------------+------------------+ ICA Mid   167     47                                             +----------+--------+--------+--------+------------+------------------+ ICA Distal62      18                                             +----------+--------+--------+--------+------------+------------------+ ECA       83      12                                             +----------+--------+--------+--------+------------+------------------+  +----------+--------+--------+--------+------------+ SubclavianPSV cm/sEDV cm/sDescribeArm  Pressure +----------+--------+--------+--------+------------+           70      56              128          +----------+--------+--------+--------+------------+ +---------+--------+--+--------+--+ VertebralPSV cm/s47EDV cm/s16 +---------+--------+--+--------+--+  ABI Findings: +------------------+-----+--------+--------------------------------------------+ Rt Pressure (mmHg)IndexWaveformComment                                      +------------------+-----+--------+--------------------------------------------+ 113                                                                         +------------------+-----+--------+--------------------------------------------+  biphasicunable to get pressure secondary to                                         technical difficulties                       +------------------+-----+--------+--------------------------------------------+ 254               1.98                                                      +------------------+-----+--------+--------------------------------------------+ 73                0.57 Abnormal                                             +------------------+-----+--------+--------------------------------------------+ +------------------+-----+----------+ Lt Pressure (mmHg)IndexWaveform   +------------------+-----+----------+ 128                               +------------------+-----+----------+                        absent     +------------------+-----+----------+ 164               1.28 monophasic +------------------+-----+----------+ 81                0.63 Abnormal   +------------------+-----+----------+ +-------+---------------------+ ABI/TBIToday's ABI/TBI       +-------+---------------------+ Right  Non compressible/0.57 +-------+---------------------+ Left   non compressible/0.63 +-------+---------------------+  Right Doppler Findings:  +--------+--------+--------------------------------+ Site    PressureComments                         +--------+--------+--------------------------------+ [id number]                                      +--------+--------+--------------------------------+ Radial          Incidental finding of AV Fistula +--------+--------+--------------------------------+  Left Doppler Findings: +--------+--------+ Site    Pressure +--------+--------+ [id number]      +--------+--------+  Technologist Notes: Mechanical interference, unable to get accurate waveforms. Mechanical interference, unable to get accurate waveforms.  Summary: Right Carotid: Velocities in the right ICA are consistent with a 1-39% stenosis. Left Carotid: Velocities in the left ICA are consistent with a 80-99% stenosis. Vertebrals: Left vertebral artery demonstrates antegrade flow. Right vertebral             artery demonstrates retrograde flow. Right ABI: Resting right ankle-brachial index indicates noncompressible right lower extremity arteries. The right toe-brachial index is abnormal. Left ABI: Resting left ankle-brachial index indicates noncompressible left lower extremity arteries. The left toe-brachial index is abnormal. Right Upper Extremity: Doppler waveforms remain within normal limits with right radial compression. Doppler waveforms remain within normal limits with right ulnar compression. Incidental finding of AV fistula at radial cath site. Left Upper Extremity: Doppler waveforms remain within normal limits with left radial compression. Doppler waveforms remain within normal limits with left  ulnar compression.  Suggest Peripheral Vascular Consult. Electronically signed by Debby Robertson on 05/22/2024 at 4:52:59 PM.    Final    CARDIAC CATHETERIZATION Result Date: 05/19/2024   Ost RCA to Prox RCA lesion is 100% stenosed.   Ost Cx to Prox Cx lesion is 90% stenosed.   1st Mrg lesion is 90% stenosed.   3rd Mrg lesion is 100%  stenosed.   LPAV lesion is 100% stenosed with 100% stenosed side branch in LPDA.   Ost LAD lesion is 70% stenosed.   Prox LAD lesion is 90% stenosed.   Mid LAD lesion is 95% stenosed.   Mid LAD to Dist LAD lesion is 85% stenosed.   The left ventricular systolic function is normal.   LV end diastolic pressure is normal.   The left ventricular ejection fraction is 55-65% by visual estimate. Critical multivessel obstructive CAD Normal LV function Normal LV filling pressures. LVEDP 9 mm Hg, PCWP 11/9, mean 11 mm Hg Normal right heart pressures. PAP 30/10, mean 17 mm Hg Normal cardiac output. 5.31 L/min, index 2.74 Plan: CT surgery consult for CABG. Not suitable for PCI   CT CORONARY FRACTIONAL FLOW RESERVE FLUID ANALYSIS Result Date: 05/19/2024 EXAM: FFRCT ANALYSIS for abnormal coronary CT-A. FINDINGS: FFRct analysis was performed on the original cardiac CT angiogram dataset. Diagrammatic representation of the FFRct analysis is provided in a separate PDF document in PACS. This dictation was created using the PDF document and an interactive 3D model of the results. 3D model is not available in the EMR/PACS. Normal FFR range is >0.80. 1. Left Main: Very short.  FFRct 1.0. 2. LAD: FFRct 0.87 ostial, 0.81 proximal, 0.72 mid. Distal LAD is not modeled. 3. LCX: FFRct 0.99 ostial. 0.8 proximal, 0.77 mid, 0.71 distal. L-PDA modeled as occluded. OM1 and OM2 are not modeled. OM3 FFRct 0.78 proximal, 0.57 distal. 4.       RCA: Occlusion at the ostium IMPRESSION: 1. FFRct findings are consistent with 100% occlusion at the ostium of the RCA and at the L-PDA. 2. Findings are concerning for severe stenosis in the mid LAD, distal LCX and OM3. 3.  Recommend cardiac catheterization. 4. LM is very short. Recommend care in engaging the LM, as there is almost dual ostia for the LAD and CX and concern for significant ostial disease in both vessels. Tiffany C. Raford, MD Electronically Signed   By: Annabella Raford M.D.   On:  05/19/2024 12:51   ECHOCARDIOGRAM COMPLETE Result Date: 05/19/2024    ECHOCARDIOGRAM REPORT   Patient Name:   [facility] Date of Exam: 05/19/2024 Medical Rec #:  [id number]       Height:       68.0 in Accession #:    [id number]      Weight:       177.0 lb Date of Birth:  November 03, 1955       BSA:          1.940 m Patient Age:    68 years        BP:           117/79 mmHg Patient Gender: M               HR:           50 bpm. Exam Location:  Inpatient Procedure: 2D Echo, Cardiac Doppler and Color Doppler (Both Spectral and Color            Flow Doppler were utilized during procedure). Indications:  Chest Pain R07.9  History:        Patient has no prior history of Echocardiogram examinations.                 Previous Myocardial Infarction; Risk Factors:Hypertension,                 Diabetes and Dyslipidemia.  Sonographer:    Tinnie Gosling RDCS Referring Phys: [phone] RONDELL A SMITH IMPRESSIONS  1. Left ventricular ejection fraction, by estimation, is 60 to 65%. The left ventricle has normal function. The left ventricle has no regional wall motion abnormalities. Left ventricular diastolic parameters were normal.  2. Right ventricular systolic function is normal. The right ventricular size is normal. Tricuspid regurgitation signal is inadequate for assessing PA pressure.  3. Left atrial size was mildly dilated.  4. The mitral valve is normal in structure. No evidence of mitral valve regurgitation. No evidence of mitral stenosis.  5. The aortic valve is normal in structure. Aortic valve regurgitation is not visualized. No aortic stenosis is present.  6. The inferior vena cava is normal in size with greater than 50% respiratory variability, suggesting right atrial pressure of 3 mmHg.  7. Cannot exclude a small PFO. Comparison(s): No prior Echocardiogram. FINDINGS  Left Ventricle: Left ventricular ejection fraction, by estimation, is 60 to 65%. The left ventricle has normal function. The left ventricle has no regional  wall motion abnormalities. The left ventricular internal cavity size was normal in size. There is  no left ventricular hypertrophy. Left ventricular diastolic parameters were normal. Right Ventricle: The right ventricular size is normal. No increase in right ventricular wall thickness. Right ventricular systolic function is normal. Tricuspid regurgitation signal is inadequate for assessing PA pressure. Left Atrium: Left atrial size was mildly dilated. Right Atrium: Right atrial size was normal in size. Pericardium: There is no evidence of pericardial effusion. Presence of epicardial fat layer. Mitral Valve: The mitral valve is normal in structure. No evidence of mitral valve regurgitation. No evidence of mitral valve stenosis. Tricuspid Valve: The tricuspid valve is normal in structure. Tricuspid valve regurgitation is trivial. No evidence of tricuspid stenosis. Aortic Valve: The aortic valve is normal in structure. Aortic valve regurgitation is not visualized. No aortic stenosis is present. Pulmonic Valve: The pulmonic valve was normal in structure. Pulmonic valve regurgitation is not visualized. No evidence of pulmonic stenosis. Aorta: The aortic root and ascending aorta are structurally normal, with no evidence of dilitation. Venous: The inferior vena cava is normal in size with greater than 50% respiratory variability, suggesting right atrial pressure of 3 mmHg. IAS/Shunts: Cannot exclude a small PFO.  LEFT VENTRICLE PLAX 2D LVIDd:         4.40 cm   Diastology LVIDs:         2.60 cm   LV e' medial:    7.29 cm/s LV PW:         1.00 cm   LV E/e' medial:  10.5 LV IVS:        1.00 cm   LV e' lateral:   7.94 cm/s LVOT diam:     2.00 cm   LV E/e' lateral: 9.7 LV SV:         71 LV SV Index:   37 LVOT Area:     3.14 cm  RIGHT VENTRICLE             IVC RV S prime:     10.40 cm/s  IVC diam: 1.60 cm TAPSE (M-mode): 2.2  cm LEFT ATRIUM             Index        RIGHT ATRIUM           Index LA diam:        3.50 cm 1.80 cm/m    RA Area:     12.20 cm LA Vol (A2C):   56.3 ml 29.02 ml/m  RA Volume:   26.10 ml  13.45 ml/m LA Vol (A4C):   44.5 ml 22.93 ml/m LA Biplane Vol: 51.2 ml 26.39 ml/m  AORTIC VALVE LVOT Vmax:   96.00 cm/s LVOT Vmean:  66.500 cm/s LVOT VTI:    0.227 m  AORTA Ao Root diam: 2.90 cm Ao Asc diam:  3.30 cm MITRAL VALVE MV Area (PHT): 3.01 cm    SHUNTS MV Decel Time: 252 msec    Systemic VTI:  0.23 m MV E velocity: 76.70 cm/s  Systemic Diam: 2.00 cm MV A velocity: 83.60 cm/s MV E/A ratio:  0.92 Emeline Calender Electronically signed by Emeline Calender Signature Date/Time: 05/19/2024/11:46:14 AM    Final    DG Chest 2 View Result Date: 05/17/2024 EXAM: 2 VIEW(S) XRAY OF THE CHEST 05/17/2024 10:56:16 PM COMPARISON: None available. CLINICAL HISTORY: cp. 68 y/o male reports CP, dizziness and SHOB x several weeks. FINDINGS: LUNGS AND PLEURA: No focal pulmonary opacity. No pulmonary edema. No pleural effusion. No pneumothorax. HEART AND MEDIASTINUM: Atherosclerotic plaque. BONES AND SOFT TISSUES: No acute osseous abnormality. IMPRESSION: 1. No acute cardiopulmonary process detected. Electronically signed by: Morgane Naveau MD 05/17/2024 10:57 PM EDT RP Workstation: [id number]     Treatments:     05/23/2024 Patient:  Franky SHAUNNA Hanson Pre-Op Dx: 3V CAD PVD HTN HLP   Post-op Dx:  same Procedure: CABG X 3.  LIMA LAD, RSVG OM1 and 2   Endoscopic greater saphenous vein harvest on the right     Surgeon and Role:      * Lightfoot, Linnie KIDD, MD - Primary    DEWAINE MICAEL Cera , PA-C - assisting  Discharge Exam: Blood pressure 123/76, pulse 65, temperature 98.1 F (36.7 C), temperature source Oral, resp. rate (!) 21, height 5' 8 (1.727 m), weight 78.3 kg, SpO2 99%. General appearance: alert, cooperative, and no distress Neurologic: intact Heart: RRR, monitor showing stable SR.  Lungs: breath sounds clear, stable sats on RA.  Abdomen: benign Extremities: no peripheral edema.  Wound: the sternotomy incision is  well-approximated and dry   Discharge Medications:  The patient has been discharged on:   1.Beta Blocker:  Yes [  x ]                              No   [   ]                              If No, reason:  2.Ace Inhibitor/ARB: Yes [   ]                                     No  [  x  ]                                     If  No, reason: Soft blood pressure  3.Statin:   Yes [ x  ]                  No  [   ]                  If No, reason:  4.Ecasa:  Yes  [ x  ]                  No   [   ]                  If No, reason:  Patient had ACS upon admission:  No  Plavix/P2Y12 inhibitor: Yes [   ]                                      No  [ x ]     Discharge Instructions     Amb Referral to Cardiac Rehabilitation   Complete by: As directed    To [place]   Diagnosis: CABG   CABG X ___: 3   After initial evaluation and assessments completed: Virtual Based Care may be provided alone or in conjunction with Phase 2 Cardiac Rehab based on patient barriers.: Yes   Intensive Cardiac Rehabilitation (ICR) [facility] location only OR Traditional Cardiac Rehabilitation (TCR) *If criteria for ICR are not met will enroll in TCR ([facility] only): Yes      Allergies as of 05/28/2024   No Known Allergies      Medication List     STOP taking these medications    lisinopril  2.5 MG tablet Commonly known as: ZESTRIL        TAKE these medications    acetaminophen  325 MG tablet Commonly known as: TYLENOL  Take 2 tablets (650 mg total) by mouth every 6 (six) hours as needed.   amiodarone 200 MG tablet Commonly known as: PACERONE Take 2 tablets (400 mg total) by mouth 2 (two) times daily. For 10 days then reduce the dose to 1 tablet by mouth twice daily for 14 days then reduce the dose to 1 tablet daily   aspirin EC 325 MG tablet Take 1 tablet (325 mg total) by mouth daily. Start taking on: May 29, 2024   fenofibrate  145 MG tablet Commonly known as: TRICOR  TAKE 1 TABLET BY MOUTH DAILY    FreeStyle Libre 3 Plus Sensor Misc 1 Device by Other route every 14 (fourteen) days. Change sensor every 15 days.   glucose blood test strip Use as instructed   metFORMIN  750 MG 24 hr tablet Commonly known as: GLUCOPHAGE -XR Take 1 tablet (750 mg total) by mouth daily with breakfast.   metoprolol tartrate 25 MG tablet Commonly known as: LOPRESSOR Take 1 tablet (25 mg total) by mouth 2 (two) times daily.   onetouch ultrasoft lancets Use as instructed   OneTouch Verio IQ System w/Device Kit 1 kit   rosuvastatin  40 MG tablet Commonly known as: CRESTOR  Take 1 tablet (40 mg total) by mouth daily.   traMADol  50 MG tablet Commonly known as: ULTRAM  Take 1 tablet (50 mg total) by mouth every 6 (six) hours as needed for up to 7 days for severe pain (pain score 7-10).   traZODone  100 MG tablet Commonly known as: DESYREL  TAKE 1 TABLET BY MOUTH AT BEDTIME        Follow-up Information  Shyrl Linnie KIDD, MD Follow up.   Specialty: Cardiothoracic Surgery Why: Please see discharge paperwork for details of appointment with surgeon.  Your first appointment will be a telephone visit.  Do not come to the office.  Subsequent visits will be in person. Contact information: [address], [place] [address] [phone]         [facility]) Follow up.   Specialty: Home Health Services Why: [facility] office referral- they will follow up post discharge Contact information: [address] [place]  [address] [phone]        Ren Donley Joelle VEAR, MD. Go on 06/08/2024.   Specialty: Cardiology Why: Your appointment is at 9:40 AM.  Please arrive about 20 minutes before the appointment. Contact information: [address] [place] [address] [phone]                 Signed:  Laurel JUDITHANN Becket, PA-C  05/28/2024, 10:31 AM

## 2024-05-25 NOTE — Progress Notes (Signed)
      [address]       [place] [address]             [phone]                 2 Days Post-Op Procedure(s) (LRB): CORONARY ARTERY BYPASS GRAFTING (CABG) X THREE, USING LEFT INTERNAL MAMMARY ARTERY AND RIGHT LEG GREATER SAPHENOUS VEIN HARVESTED ENDOSCOPICALLY (N/A)   Events: No events  _______________________________________________________________ Vitals: BP 134/75 (BP Location: Right Arm)   Pulse 68   Temp 98.4 F (36.9 C) (Oral)   Resp 17   Ht 5' 8 (1.727 m)   Wt 82.7 kg   SpO2 93%   BMI 27.72 kg/m  Filed Weights   05/23/24 0636 05/24/24 0500 05/25/24 0500  Weight: 80.3 kg 83.4 kg 82.7 kg     - Neuro: alert NAD  - Cardiovascular: sinus   Drips: none.      - Pulm: EWOB  ABG    Component Value Date/Time   PHART 7.308 (L) 05/23/2024 1756   PCO2ART 38.7 05/23/2024 1756   PO2ART 98 05/23/2024 1756   HCO3 19.3 (L) 05/23/2024 1756   TCO2 20 (L) 05/23/2024 1756   ACIDBASEDEF 6.0 (H) 05/23/2024 1756   O2SAT 97 05/23/2024 1756    - Abd: ND - Extremity: trace edema  .Intake/Output      10/22 0701 10/23 0700 10/23 0701 10/24 0700   P.O. 680    I.V. (mL/kg) 149.2 (1.8)    Blood     IV Piggyback 232.1    Total Intake(mL/kg) 1061.3 (12.8)    Urine (mL/kg/hr) 2495 (1.3) 425 (1)   Blood     Chest Tube 150    Total Output 2645 425   Net -1583.8 -425           _______________________________________________________________ Labs:    Latest Ref Rng & Units 05/25/2024    4:54 AM 05/24/2024    3:07 PM 05/24/2024    4:57 AM  CBC  WBC 4.0 - 10.5 K/uL 8.6  10.4  7.7   Hemoglobin 13.0 - 17.0 g/dL 88.7  87.9  88.6   Hematocrit 39.0 - 52.0 % 33.6  35.8  32.7   Platelets 150 - 400 K/uL 160  190  153       Latest Ref Rng & Units 05/25/2024    4:54 AM 05/24/2024    3:07 PM 05/24/2024    4:57 AM  CMP  Glucose 70 - 99 mg/dL 876  836  885   BUN 8 - 23 mg/dL 14  14  12    Creatinine 0.61 - 1.24 mg/dL 8.76  8.58  8.77   Sodium 135 -  145 mmol/L 134  132  134   Potassium 3.5 - 5.1 mmol/L 4.3  4.3  4.2   Chloride 98 - 111 mmol/L 101  101  108   CO2 22 - 32 mmol/L 25  24  20    Calcium  8.9 - 10.3 mg/dL 8.2  7.9  7.7     CXR: B effusion  _______________________________________________________________  Assessment and Plan: POD 2 s/p CABG  Neuro: pain controlled CV: on A/S/BB.  Pulm: IS, ambulation Renal: creat stable GI: on diet Heme: stable ID: afebrile Endo: SSI Dispo: floor   Matthew Patterson 05/25/2024 12:22 PM

## 2024-05-25 NOTE — Plan of Care (Signed)
  Problem: Activity: Goal: Risk for activity intolerance will decrease Outcome: Progressing   Problem: Elimination: Goal: Will not experience complications related to urinary retention Outcome: Progressing   Problem: Safety: Goal: Ability to remain free from injury will improve Outcome: Progressing   

## 2024-05-26 LAB — GLUCOSE, CAPILLARY
Glucose-Capillary: 154 mg/dL — ABNORMAL HIGH (ref 70–99)
Glucose-Capillary: 144 mg/dL — ABNORMAL HIGH (ref 70–99)
Glucose-Capillary: 135 mg/dL — ABNORMAL HIGH (ref 70–99)
Glucose-Capillary: 117 mg/dL — ABNORMAL HIGH (ref 70–99)

## 2024-05-26 LAB — BASIC METABOLIC PANEL WITH GFR
Anion gap: 13 (ref 5–15)
BUN: 17 mg/dL (ref 8–23)
CO2: 22 mmol/L (ref 22–32)
Calcium: 8.1 mg/dL — ABNORMAL LOW (ref 8.9–10.3)
Chloride: 98 mmol/L (ref 98–111)
Creatinine, Ser: 1.1 mg/dL (ref 0.61–1.24)
GFR, Estimated: >60 mL/min (ref >60)
Glucose, Bld: 144 mg/dL — ABNORMAL HIGH (ref 70–99)
Potassium: 4 mmol/L (ref 3.5–5.1)
Sodium: 133 mmol/L — ABNORMAL LOW (ref 135–145)

## 2024-05-26 LAB — TYPE AND SCREEN
ABO/RH(D): O POS
Antibody Screen: NEGATIVE
Unit division: 0
Unit division: 0

## 2024-05-26 LAB — CBC
HCT: 33.7 % — ABNORMAL LOW (ref 39.0–52.0)
Hemoglobin: 11.6 g/dL — ABNORMAL LOW (ref 13.0–17.0)
MCH: 32 pg (ref 26.0–34.0)
MCHC: 34.4 g/dL (ref 30.0–36.0)
MCV: 92.8 fL (ref 80.0–100.0)
Platelets: 171 K/uL (ref 150–400)
RBC: 3.63 MIL/uL — ABNORMAL LOW (ref 4.22–5.81)
RDW: 12.5 % (ref 11.5–15.5)
WBC: 7.3 K/uL (ref 4.0–10.5)
nRBC: 0 % (ref 0.0–0.2)

## 2024-05-26 LAB — BPAM RBC
Blood Product Expiration Date: 202511162359
Blood Product Expiration Date: 202511162359
Unit Type and Rh: 5100
Unit Type and Rh: 5100

## 2024-05-26 MED ORDER — AMIODARONE HCL 200 MG PO TABS
400.0000 mg | ORAL_TABLET | Freq: Two times a day (BID) | ORAL | Status: DC
  Administered 2024-05-26 – 2024-05-28 (×5): 400 mg via ORAL
  Filled 2024-05-26 (×5): qty 2

## 2024-05-26 NOTE — Progress Notes (Signed)
 Mobility Specialist Progress Note:    05/26/24 1026  Mobility  Activity Ambulated with assistance  Level of Assistance Standby assist, set-up cues, supervision of patient - no hands on  Assistive Device Front wheel walker  Distance Ambulated (ft) 300 ft  RUE Weight Bearing Per Provider Order NWB  LUE Weight Bearing Per Provider Order NWB  Activity Response Tolerated well  Mobility Referral Yes  Mobility visit 1 Mobility  Mobility Specialist Start Time (ACUTE ONLY) 1026  Mobility Specialist Stop Time (ACUTE ONLY) 1039  Mobility Specialist Time Calculation (min) (ACUTE ONLY) 13 min   Pt received in chair, agreeable to mobility session. Ambulated in hallway with MinG and RW. Tolerated well, asx throughout. Returned to room. Lying in bed comfortably in bed with all needs met.   Jahnya Trindade Mobility Specialist Please contact via Special educational needs teacher or  Rehab office at [phone]

## 2024-05-26 NOTE — Progress Notes (Addendum)
 3 Days Post-Op Procedure(s) (LRB): CORONARY ARTERY BYPASS GRAFTING (CABG) X THREE, USING LEFT INTERNAL MAMMARY ARTERY AND RIGHT LEG GREATER SAPHENOUS VEIN HARVESTED ENDOSCOPICALLY (N/A) Subjective: Feels ok, pain control is pretty good  Objective: Vital signs in last 24 hours: Temp:  [97.8 F (36.6 C)-98.4 F (36.9 C)] 97.8 F (36.6 C) (10/24 0348) Pulse Rate:  [57-106] 58 (10/23 1530) Cardiac Rhythm: Normal sinus rhythm (10/23 1900) Resp:  [13-26] 20 (10/24 0348) BP: (100-135)/(66-87) 120/81 (10/24 0348) SpO2:  [89 %-96 %] 94 % (10/24 0348) Weight:  [81.2 kg] 81.2 kg (10/24 0500)  Hemodynamic parameters for last 24 hours:    Intake/Output from previous day: 10/23 0701 - 10/24 0700 In: 88.5 [I.V.:88.5] Out: 775 [Urine:775] Intake/Output this shift: No intake/output data recorded.  General appearance: alert, cooperative, and no distress Heart: regular rate and rhythm Lungs: clear to auscultation bilaterally Abdomen: benign Extremities: no edema Wound: EVH site ok, chest dressing intact  Lab Results: Recent Labs    05/25/24 0454 05/26/24 0545  WBC 8.6 7.3  HGB 11.2* 11.6*  HCT 33.6* 33.7*  PLT 160 171   BMET:  Recent Labs    05/25/24 0454 05/26/24 0545  NA 134* 133*  K 4.3 4.0  CL 101 98  CO2 25 22  GLUCOSE 123* 144*  BUN 14 17  CREATININE 1.23 1.10  CALCIUM  8.2* 8.1*    PT/INR:  Recent Labs    05/23/24 1218  LABPROT 17.0*  INR 1.3*   ABG    Component Value Date/Time   PHART 7.308 (L) 05/23/2024 1756   HCO3 19.3 (L) 05/23/2024 1756   TCO2 20 (L) 05/23/2024 1756   ACIDBASEDEF 6.0 (H) 05/23/2024 1756   O2SAT 97 05/23/2024 1756   CBG (last 3)  Recent Labs    05/25/24 1605 05/25/24 2100 05/26/24 0601  GLUCAP 146* 126* 154*    Meds Scheduled Meds:  acetaminophen   1,000 mg Oral Q6H   Or   acetaminophen  (TYLENOL ) oral liquid 160 mg/5 mL  1,000 mg Per Tube Q6H   aspirin EC  325 mg Oral Daily   Or   aspirin  324 mg Per Tube Daily    bisacodyl  10 mg Oral Daily   Or   bisacodyl  10 mg Rectal Daily   Chlorhexidine Gluconate Cloth  6 each Topical Daily   docusate sodium  200 mg Oral Daily   fentaNYL (SUBLIMAZE) injection  25 mcg Intravenous Once   insulin aspart  0-24 Units Subcutaneous TID AC & HS   insulin glargine-yfgn  8 Units Subcutaneous Daily   methocarbamol  750 mg Oral TID   metoprolol tartrate  25 mg Oral BID   pantoprazole  40 mg Oral Daily   polyethylene glycol  17 g Oral BID   rosuvastatin   40 mg Oral Daily   sodium chloride  flush  10-40 mL Intracatheter Q12H   sodium chloride  flush  3 mL Intravenous Q12H   sodium chloride  flush  3 mL Intravenous Q12H   Continuous Infusions:  sodium chloride      albumin human 999 mL/hr at 05/25/24 1500   amiodarone 30 mg/hr (05/26/24 0601)   lactated ringers  Stopped (05/25/24 0922)   PRN Meds:.sodium chloride , albumin human, dextrose, metoprolol tartrate, ondansetron  (ZOFRAN ) IV, mouth rinse, oxyCODONE , sodium chloride  flush, sodium chloride  flush, sodium chloride  flush, traMADol   Xrays DG Chest Port 1 View Result Date: 05/25/2024 CLINICAL DATA:  [id number] Pneumothorax [id number] EXAM: PORTABLE CHEST - 1 VIEW COMPARISON:  05/24/2024 FINDINGS: Right IJ approach introducer sheath  with a pulmonary artery catheter in place, terminating in the lower SVC. Similar low lung volumes with small left pleural effusion and streaky left basilar atelectasis. Trace right pleural effusion. No pneumothorax. Left-sided thoracostomy tube is similarly positioned. Mediastinal drain also noted. Mild cardiomegaly. Sternotomy wires and CABG markers. IMPRESSION: 1. No significant interval change to the lungs. 2. Similarly positioned support tubes and lines, as delineated above. Electronically Signed   By: Rogelia Myers M.D.   On: 05/25/2024 09:13    Assessment/Plan: S/P Procedure(s) (LRB): CORONARY ARTERY BYPASS GRAFTING (CABG) X THREE, USING LEFT INTERNAL MAMMARY ARTERY AND RIGHT LEG GREATER  SAPHENOUS VEIN HARVESTED ENDOSCOPICALLY (N/A) POD#3  1 afeb, VSS, sinus, some PAC's,  some afib, one pause - HR 50's-100's- on amio gtt- cont for now 2 O2 sats good on RA 3 voiding- unmeasured, weight about 1 kg > preop 4 normal renal fxn, minor hyponatremia- sodium 133 5 H/H - stable expected ABLA 6 conts rehab and pulm hygiene    LOS: 6 days    Lemond FORBES Cera PA-C Pager [phone] 05/26/2024  PO amio Dispo planning  Kiev Labrosse O Imajean Mcdermid

## 2024-05-26 NOTE — Progress Notes (Signed)
 CARDIAC REHAB PHASE I   PRE:  Rate/Rhythm: 70 NSR  BP:  Sitting: 141/74      SpO2: 94 RA  MODE:  Ambulation: 470 ft    POST:  Rate/Rhythm: 87 NSR  BP:  Sitting: Helped to BR post-walk      SpO2: 97 RA  Pt ambulated with supervision assist in the hallway using RW. Pt able to stand with min assist and ambulated the hallway with supervision assist. Pt took x1 standing rest break to monitor SpO2(97%). Pt helped to BR after returning to room.   Garen FORBES Candy  MS, ACSM-CEP 9:26 AM 05/26/2024

## 2024-05-26 NOTE — Plan of Care (Signed)

## 2024-05-26 NOTE — Progress Notes (Signed)
 Pt had two episodes of anxiety. First episode: anxious about abdominal pain. Pt stated that he could not pass gas.  Pt given tramadol  for back pain (chronic). Pt called wife saying he was short of breath. Gave suppository to relieve abdominal pain. Gave pt breathing exercises and incentive. PA Myron called and aware. Second episode pt on toilet and upset that he was left in the bathroom alone. Son was in room and had called for assistance. Pt became anxious and son thought he was hyperventilating and pulled emergency staff call. Pt again educated on deep breathing and relaxation. Wife and son at bedside during bedside report and patient feels better. Ann Nena Hoard, RN

## 2024-05-27 LAB — GLUCOSE, CAPILLARY
Glucose-Capillary: 132 mg/dL — ABNORMAL HIGH (ref 70–99)
Glucose-Capillary: 133 mg/dL — ABNORMAL HIGH (ref 70–99)
Glucose-Capillary: 122 mg/dL — ABNORMAL HIGH (ref 70–99)
Glucose-Capillary: 150 mg/dL — ABNORMAL HIGH (ref 70–99)

## 2024-05-27 MED ORDER — ENOXAPARIN SODIUM 40 MG/0.4ML IJ SOSY
40.0000 mg | PREFILLED_SYRINGE | INTRAMUSCULAR | Status: DC
  Administered 2024-05-27: 40 mg via SUBCUTANEOUS
  Filled 2024-05-27: qty 0.4

## 2024-05-27 MED ORDER — POLYETHYLENE GLYCOL 3350 17 G PO PACK: 17.0000 g | PACK | Freq: Every day | ORAL | Status: DC | PRN

## 2024-05-27 MED ORDER — LACTULOSE 10 GM/15ML PO SOLN
20.0000 g | Freq: Once | ORAL | Status: AC
  Administered 2024-05-27: 20 g via ORAL
  Filled 2024-05-27: qty 30

## 2024-05-27 NOTE — Progress Notes (Signed)
 Pt is alert and fully oriented x 4, afebrile, stable hemodynamically, NSR on the monitor, normal respiratory effort, no acute distress noted. He is able to sleep well overnight with no major complaints. Sternal wound with original Hydro silver dressing is dry and clean, no drainage. Pain is well tolerated. Right internal jugular central line removed this evening. Vascular site dressing is dry and clean.  Plan of care is reviewed. Pt has been progressing. We will continue to monitor.  Problem: Clinical Measurements: Goal: Ability to maintain clinical measurements within normal limits will improve Outcome: Progressing Goal: Will remain free from infection Outcome: Progressing Goal: Diagnostic test results will improve Outcome: Progressing Goal: Respiratory complications will improve Outcome: Progressing Goal: Cardiovascular complication will be avoided Outcome: Progressing   Problem: Health Behavior/Discharge Planning: Goal: Ability to manage health-related needs will improve Outcome: Progressing   Problem: Activity: Goal: Risk for activity intolerance will decrease Outcome: Progressing   Problem: Elimination: Goal: Will not experience complications related to bowel motility Outcome: Progressing Goal: Will not experience complications related to urinary retention Outcome: Progressing   Problem: Pain Managment: Goal: General experience of comfort will improve and/or be controlled Outcome: Progressing   Problem: Safety: Goal: Ability to remain free from injury will improve Outcome: Progressing   Problem: Skin Integrity: Goal: Risk for impaired skin integrity will decrease Outcome: Progressing   Problem: Cardiovascular: Goal: Ability to achieve and maintain adequate cardiovascular perfusion will improve Outcome: Progressing Goal: Vascular access site(s) Level 0-1 will be maintained Outcome: Progressing   Problem: Education: Goal: Understanding of CV disease, CV risk  reduction, and recovery process will improve Outcome: Progressing Goal: Individualized Educational Video(s) Outcome: Progressing   Problem: Cardiovascular: Goal: Ability to achieve and maintain adequate cardiovascular perfusion will improve Outcome: Progressing Goal: Vascular access site(s) Level 0-1 will be maintained Outcome: Progressing   Wendi Dash, RN

## 2024-05-27 NOTE — Progress Notes (Signed)
 CARDIAC REHAB PHASE I   PRE:  Rate/Rhythm: 54 SB    BP: sitting 118/73    SpO2: 96 RA  MODE:  Ambulation: 470 ft   POST:  Rate/Rhythm: 71 SR    BP: sitting to BR     SpO2:    Pt able to get out of bed following sternal precautions independently. Felt more comfortable initially walking with RW however able to walk last 100 ft without it and was steady, standby assist. Tolerated well. To BR then bed again. Does struggle to get legs in bed completely independently. Wife present and supportive.  Discussed with pt and wife IS, sternal precautions, exercise, diet and CRPII. Pt receptive. Will refer to [facility]. Pt knows to ambulate x3 a day in hospital. Many appropriate questions answered. [phone]  Aliene Aris BS, ACSM-CEP 05/27/2024 12:12 PM

## 2024-05-27 NOTE — Progress Notes (Signed)
 Patient having numerous BM this afternoon. Bowel regiment D/C, P/A aware.

## 2024-05-27 NOTE — Progress Notes (Addendum)
   [address] [place] [employer] [address]             [phone]   4 Days Post-Op Procedure(s) (LRB): CORONARY ARTERY BYPASS GRAFTING (CABG) X THREE, USING LEFT INTERNAL MAMMARY ARTERY AND RIGHT LEG GREATER SAPHENOUS VEIN HARVESTED ENDOSCOPICALLY (N/A) Subjective: No new concerns. Progressing with mobility.  On RA. No BM yet.   Objective: Vital signs in last 24 hours: Temp:  [97.7 F (36.5 C)-98.7 F (37.1 C)] 98.7 F (37.1 C) (10/25 0814) Pulse Rate:  [55-68] 66 (10/25 0814) Cardiac Rhythm: Normal sinus rhythm (10/24 1900) Resp:  [16-21] 20 (10/25 0814) BP: (110-133)/(68-86) 133/72 (10/25 0814) SpO2:  [93 %-100 %] 100 % (10/25 0814) Weight:  [81.1 kg] 81.1 kg (10/25 0500)    Intake/Output from previous day: 10/24 0701 - 10/25 0700 In: 500 [P.O.:500] Out: -  Intake/Output this shift: No intake/output data recorded.  General appearance: alert, cooperative, and no distress Neurologic: intact Heart: RRR, monitor showing stable SR.  Lungs: breath sounds clear, stable sats on RA.  Abdomen: benign Extremities: no significant peripheral edema.  Wound: the sternotomy incision is covered with a dry Aquacel dressing (to be removed today). The EVH incision is intact and dry.   Lab Results: Recent Labs    05/25/24 0454 05/26/24 0545  WBC 8.6 7.3  HGB 11.2* 11.6*  HCT 33.6* 33.7*  PLT 160 171   BMET:  Recent Labs    05/25/24 0454 05/26/24 0545  NA 134* 133*  K 4.3 4.0  CL 101 98  CO2 25 22  GLUCOSE 123* 144*  BUN 14 17  CREATININE 1.23 1.10  CALCIUM  8.2* 8.1*    PT/INR: No results for input(s): LABPROT, INR in the last 72 hours. ABG    Component Value Date/Time   PHART 7.308 (L) 05/23/2024 1756   HCO3 19.3 (L) 05/23/2024 1756   TCO2 20 (L) 05/23/2024 1756   ACIDBASEDEF 6.0 (H) 05/23/2024 1756   O2SAT 97 05/23/2024 1756   CBG (last 3)  Recent Labs    05/26/24 1642 05/26/24 2100 05/27/24 0616  GLUCAP 135* 117* 132*     Assessment/Plan: S/P Procedure(s) (LRB): CORONARY ARTERY BYPASS GRAFTING (CABG) X THREE, USING LEFT INTERNAL MAMMARY ARTERY AND RIGHT LEG GREATER SAPHENOUS VEIN HARVESTED ENDOSCOPICALLY (N/A)  -POD4 CABG x 3. Stable VS and cardiac rhythm. On ASA, metoprolol, rosuvastatin . Nearly independent with mobility. Within 1kg of pre-op Wt.   -Post-op atrial fibrillation- converted back to SR on IV amiodarone. Continuing loading with oral amiodarone. F/U BMP tomorrow.   -GI- taking PO's but no BM yet. Lactulose today.   -ENDO- type 2 DM. CBG's well controlled. Continue Semglee 8u daily and SSI.   -PULM- stable sats on RA.  Encouraging pulm hygiene.   -DVT PPX- ambulate, enoxaparin daily.   -Disposition- Progressing well. Potential discharge to home tomorrow if rhythm stable.    LOS: 7 days    Myron G. Roddenberry, PA-C 05/27/2024  Looks good this morning.  On RA, hemodynamically stable in NSR.  Ambulating in the halls, needs BM.  If has BM, plan for home tomorrow provided rhythm remains stable.  Con Clunes, MD Cardiothoracic Surgery Pager: [phone]

## 2024-05-28 DIAGNOSIS — I4891 Unspecified atrial fibrillation: Secondary | ICD-10-CM

## 2024-05-28 LAB — BASIC METABOLIC PANEL WITH GFR
Anion gap: 10 (ref 5–15)
BUN: 17 mg/dL (ref 8–23)
CO2: 23 mmol/L (ref 22–32)
Calcium: 8.3 mg/dL — ABNORMAL LOW (ref 8.9–10.3)
Chloride: 102 mmol/L (ref 98–111)
Creatinine, Ser: 1.23 mg/dL (ref 0.61–1.24)
GFR, Estimated: >60 mL/min (ref >60)
Glucose, Bld: 131 mg/dL — ABNORMAL HIGH (ref 70–99)
Potassium: 3.8 mmol/L (ref 3.5–5.1)
Sodium: 135 mmol/L (ref 135–145)

## 2024-05-28 LAB — GLUCOSE, CAPILLARY
Glucose-Capillary: 119 mg/dL — ABNORMAL HIGH (ref 70–99)
Glucose-Capillary: 133 mg/dL — ABNORMAL HIGH (ref 70–99)

## 2024-05-28 MED ORDER — ACETAMINOPHEN 325 MG PO TABS
650.0000 mg | ORAL_TABLET | Freq: Four times a day (QID) | ORAL | Status: AC | PRN
Start: 2024-05-28 — End: ?

## 2024-05-28 MED ORDER — ASPIRIN 325 MG PO TBEC: 325.0000 mg | DELAYED_RELEASE_TABLET | Freq: Every day | ORAL | 3 refills | Status: AC

## 2024-05-28 MED ORDER — METOPROLOL TARTRATE 25 MG PO TABS: 25.0000 mg | ORAL_TABLET | Freq: Two times a day (BID) | ORAL | 5 refills | Status: DC

## 2024-05-28 MED ORDER — AMIODARONE HCL 200 MG PO TABS: 400.0000 mg | ORAL_TABLET | Freq: Two times a day (BID) | ORAL | 2 refills | Status: DC

## 2024-05-28 MED ORDER — TRAMADOL HCL 50 MG PO TABS: 50.0000 mg | ORAL_TABLET | Freq: Four times a day (QID) | ORAL | 0 refills | Status: AC | PRN

## 2024-05-28 NOTE — Plan of Care (Signed)
  Problem: Education: Goal: Knowledge of General Education information will improve Description: Including pain rating scale, medication(s)/side effects and non-pharmacologic comfort measures Outcome: Progressing   Problem: Health Behavior/Discharge Planning: Goal: Ability to manage health-related needs will improve Outcome: Progressing   Problem: Clinical Measurements: Goal: Ability to maintain clinical measurements within normal limits will improve Outcome: Progressing Goal: Will remain free from infection Outcome: Progressing Goal: Diagnostic test results will improve Outcome: Progressing Goal: Respiratory complications will improve Outcome: Progressing Goal: Cardiovascular complication will be avoided Outcome: Progressing   Problem: Activity: Goal: Risk for activity intolerance will decrease Outcome: Progressing   Problem: Cardiovascular: Goal: Ability to achieve and maintain adequate cardiovascular perfusion will improve Outcome: Progressing Goal: Vascular access site(s) Level 0-1 will be maintained Outcome: Progressing

## 2024-05-28 NOTE — Progress Notes (Addendum)
   [address], [place] [employer] [address]             [phone]   5 Days Post-Op Procedure(s) (LRB): CORONARY ARTERY BYPASS GRAFTING (CABG) X THREE, USING LEFT INTERNAL MAMMARY ARTERY AND RIGHT LEG GREATER SAPHENOUS VEIN HARVESTED ENDOSCOPICALLY (N/A) Subjective: Awake and alert.  Walked several times yesterday and again this morning.  Had returned of bowel function with several loose stools yesterday. Feels he is ready to return home today  Objective: Vital signs in last 24 hours: Temp:  [98 F (36.7 C)-99.1 F (37.3 C)] 98.1 F (36.7 C) (10/26 0822) Pulse Rate:  [55-68] 65 (10/26 0822) Cardiac Rhythm: Normal sinus rhythm (10/25 1900) Resp:  [18-23] 21 (10/26 0822) BP: (101-132)/(61-76) 123/76 (10/26 0822) SpO2:  [95 %-99 %] 99 % (10/26 0822) Weight:  [78.3 kg] 78.3 kg (10/26 0143)    Intake/Output from previous day: 10/25 0701 - 10/26 0700 In: 240 [P.O.:240] Out: -  Intake/Output this shift: No intake/output data recorded.  General appearance: alert, cooperative, and no distress Neurologic: intact Heart: RRR, monitor showing stable SR.  Lungs: breath sounds clear, stable sats on RA.  Abdomen: benign Extremities: no peripheral edema.  Wound: the sternotomy incision is well-approximated and dry  Lab Results: Recent Labs    05/26/24 0545  WBC 7.3  HGB 11.6*  HCT 33.7*  PLT 171   BMET:  Recent Labs    05/26/24 0545 05/28/24 0221  NA 133* 135  K 4.0 3.8  CL 98 102  CO2 22 23  GLUCOSE 144* 131*  BUN 17 17  CREATININE 1.10 1.23  CALCIUM  8.1* 8.3*    PT/INR: No results for input(s): LABPROT, INR in the last 72 hours. ABG    Component Value Date/Time   PHART 7.308 (L) 05/23/2024 1756   HCO3 19.3 (L) 05/23/2024 1756   TCO2 20 (L) 05/23/2024 1756   ACIDBASEDEF 6.0 (H) 05/23/2024 1756   O2SAT 97 05/23/2024 1756   CBG (last 3)  Recent Labs    05/27/24 2112 05/28/24 0630 05/28/24 0940  GLUCAP 150* 119* 133*     Assessment/Plan: S/P Procedure(s) (LRB): CORONARY ARTERY BYPASS GRAFTING (CABG) X THREE, USING LEFT INTERNAL MAMMARY ARTERY AND RIGHT LEG GREATER SAPHENOUS VEIN HARVESTED ENDOSCOPICALLY (N/A)  -POD5 CABG x 3. Stable VS and cardiac rhythm. On ASA, metoprolol, rosuvastatin .  He is independent with mobility. Within 1kg of pre-op Wt.   -Post-op atrial fibrillation- converted back to SR on IV amiodarone. Continuing loading with oral amiodarone.   -GI-appetite improved, tolerating diet.  Bowel function returned yesterday.  Bowel regimen discontinued  -ENDO- type 2 DM. CBG's well controlled. Continue Semglee 8u daily and SSI.   -PULM- stable sats on RA.  Encouraging pulm hygiene.   -Disposition-ready for discharge home today.  Instructions provided regarding diet, activity, wound care, and follow-up   LOS: 8 days    Laurel JUDITHANN Becket, PA-C 05/28/2024  Walking the halls independently this morning.  Had multiple BM yesterday.  Remains in NSR and labs stable.  Discharge home today  Con Clunes, MD Cardiothoracic Surgery Pager: [phone]

## 2024-05-28 NOTE — Plan of Care (Signed)
  Problem: Activity: Goal: Ability to return to baseline activity level will improve 05/28/2024 1047 by Christie Carlin NOVAK, RN Outcome: Adequate for Discharge 05/28/2024 1046 by Christie Carlin NOVAK, RN Outcome: Progressing   Problem: Cardiovascular: Goal: Ability to achieve and maintain adequate cardiovascular perfusion will improve 05/28/2024 1047 by Christie Carlin NOVAK, RN Outcome: Adequate for Discharge 05/28/2024 1046 by Christie Carlin NOVAK, RN Outcome: Progressing Goal: Vascular access site(s) Level 0-1 will be maintained 05/28/2024 1047 by Christie Carlin NOVAK, RN Outcome: Adequate for Discharge 05/28/2024 1046 by Christie Carlin NOVAK, RN Outcome: Progressing

## 2024-05-31 ENCOUNTER — Telehealth: Payer: Self-pay

## 2024-05-31 NOTE — Telephone Encounter (Signed)
 FMLA form completed and faxed to [employer] @ [phone] claim# [id number].  Beginning LOA 05/17/24 through 08/21/24.? DOS 05/23/24

## 2024-06-01 ENCOUNTER — Other Ambulatory Visit: Payer: Self-pay | Admitting: Physician Assistant

## 2024-06-01 ENCOUNTER — Emergency Department (HOSPITAL_BASED_OUTPATIENT_CLINIC_OR_DEPARTMENT_OTHER)
Admission: EM | Admit: 2024-06-01 | Discharge: 2024-06-01 | Disposition: A | Discharge status: Home / Self Care | Payer: Self-pay | Attending: Emergency Medicine | Admitting: Emergency Medicine

## 2024-06-01 ENCOUNTER — Encounter (HOSPITAL_BASED_OUTPATIENT_CLINIC_OR_DEPARTMENT_OTHER): Payer: Self-pay

## 2024-06-01 ENCOUNTER — Telehealth (HOSPITAL_COMMUNITY): Payer: Self-pay

## 2024-06-01 ENCOUNTER — Other Ambulatory Visit: Payer: Self-pay

## 2024-06-01 DIAGNOSIS — Z5189 Encounter for other specified aftercare: Secondary | ICD-10-CM | POA: Insufficient documentation

## 2024-06-01 DIAGNOSIS — Z85820 Personal history of malignant melanoma of skin: Secondary | ICD-10-CM | POA: Insufficient documentation

## 2024-06-01 DIAGNOSIS — E119 Type 2 diabetes mellitus without complications: Secondary | ICD-10-CM | POA: Insufficient documentation

## 2024-06-01 DIAGNOSIS — M79604 Pain in right leg: Secondary | ICD-10-CM | POA: Insufficient documentation

## 2024-06-01 DIAGNOSIS — Z4801 Encounter for change or removal of surgical wound dressing: Secondary | ICD-10-CM | POA: Diagnosis not present

## 2024-06-01 LAB — BASIC METABOLIC PANEL WITH GFR
Anion gap: 10 (ref 5–15)
BUN: 24 mg/dL — ABNORMAL HIGH (ref 8–23)
CO2: 23 mmol/L (ref 22–32)
Calcium: 9.3 mg/dL (ref 8.9–10.3)
Chloride: 103 mmol/L (ref 98–111)
Creatinine, Ser: 1.36 mg/dL — ABNORMAL HIGH (ref 0.61–1.24)
GFR, Estimated: 57 mL/min — ABNORMAL LOW (ref >60)
Glucose, Bld: 110 mg/dL — ABNORMAL HIGH (ref 70–99)
Potassium: 4.6 mmol/L (ref 3.5–5.1)
Sodium: 136 mmol/L (ref 135–145)

## 2024-06-01 LAB — CBC
HCT: 33.2 % — ABNORMAL LOW (ref 39.0–52.0)
Hemoglobin: 11 g/dL — ABNORMAL LOW (ref 13.0–17.0)
MCH: 31.7 pg (ref 26.0–34.0)
MCHC: 33.1 g/dL (ref 30.0–36.0)
MCV: 95.7 fL (ref 80.0–100.0)
Platelets: 405 K/uL — ABNORMAL HIGH (ref 150–400)
RBC: 3.47 MIL/uL — ABNORMAL LOW (ref 4.22–5.81)
RDW: 13.1 % (ref 11.5–15.5)
WBC: 7.2 K/uL (ref 4.0–10.5)
nRBC: 0 % (ref 0.0–0.2)

## 2024-06-01 MED ORDER — CEPHALEXIN 500 MG PO CAPS: 500.0000 mg | ORAL_CAPSULE | Freq: Two times a day (BID) | ORAL | 0 refills | Status: AC

## 2024-06-01 MED ORDER — CEPHALEXIN 250 MG PO CAPS
500.0000 mg | ORAL_CAPSULE | Freq: Once | ORAL | Status: AC
Start: 2024-06-01 — End: 2024-06-01
  Administered 2024-06-01: 500 mg via ORAL
  Filled 2024-06-01: qty 2

## 2024-06-01 NOTE — Telephone Encounter (Signed)
 Re-faxed cardiac rehab referral to [facility].

## 2024-06-01 NOTE — Discharge Instructions (Signed)
 Please keep your follow-up appointment with your surgery team.  They are working to get you in for a wound check appointment also.  Please be on the look out for this.  As a precaution, I am starting you on antibiotics.  Return with any new or suddenly worsening symptoms.

## 2024-06-01 NOTE — ED Provider Notes (Signed)
 Emergency Department Provider Note   I have reviewed the triage vital signs and the nursing notes.   HISTORY  Chief Complaint Post-op Problem   HPI Matthew Patterson is a 68 y.o. male presents the ED with right leg redness near the site of vein harvesting on the right cath. Patient denies fever or drainage. No severe or sudden worsening pain. He notes that a friend and wife pointed out that it appeared more red than they would expect and were concerned for possible for infection which prompted his ED evaluation. He has not contacted his surgery team PTA.     Past Medical History:  Diagnosis Date   Anxiety and depression    Diabetes mellitus without complication (HCC)    GERD (gastroesophageal reflux disease)    Hyperlipidemia    Melanoma (HCC) 2023   back    Review of Systems  Constitutional: No fever/chills Cardiovascular: Denies chest pain. Respiratory: Denies shortness of breath. Gastrointestinal: No abdominal pain.   Musculoskeletal: Negative for back pain. Skin: Rash to right calf.   ____________________________________________   PHYSICAL EXAM:  VITAL SIGNS: ED Triage Vitals  Encounter Vitals Group     BP 06/01/24 1140 100/65     Pulse Rate 06/01/24 1140 (!) 46     Resp 06/01/24 1140 16     Temp 06/01/24 1140 98 F (36.7 C)     Temp Source 06/01/24 1140 Oral     SpO2 06/01/24 1140 100 %   Constitutional: Alert and oriented. Well appearing and in no acute distress. Eyes: Conjunctivae are normal. Head: Atraumatic. Nose: No congestion/rhinnorhea. Mouth/Throat: Mucous membranes are moist.   Neck: No stridor.   Cardiovascular: Normal rate, regular rhythm. Good peripheral circulation. Grossly normal heart sounds.   Respiratory: Normal respiratory effort.  No retractions. Lungs CTAB. Gastrointestinal: Soft and nontender. No distention.  Musculoskeletal: No lower extremity tenderness nor edema. No gross deformities of extremities. Neurologic:  Normal speech  and language.  Skin:  Skin is warm, dry and intact. Slight erythema immediately surrounding the leg incision as pictured. No warmth or streaking redness in the leg.     ____________________________________________   LABS (all labs ordered are listed, but only abnormal results are displayed)  Labs Reviewed  BASIC METABOLIC PANEL WITH GFR - Abnormal; Notable for the following components:      Result Value   Glucose, Bld 110 (*)    BUN 24 (*)    Creatinine, Ser 1.36 (*)    GFR, Estimated 57 (*)    All other components within normal limits  CBC - Abnormal; Notable for the following components:   RBC 3.47 (*)    Hemoglobin 11.0 (*)    HCT 33.2 (*)    Platelets 405 (*)    All other components within normal limits    ____________________________________________   PROCEDURES  Procedure(s) performed:   Procedures  None ____________________________________________   INITIAL IMPRESSION / ASSESSMENT AND PLAN / ED COURSE  Pertinent labs & imaging results that were available during my care of the patient were reviewed by me and considered in my medical decision making (see chart for details).   This patient is Presenting for Evaluation of post-op evaluation, which does require a range of treatment options, and is a complaint that involves a moderate risk of morbidity and mortality.  The Differential Diagnoses include normal post-op healing, wound infection, DVT, etc.  Critical Interventions-    Medications  cephALEXin (KEFLEX) capsule 500 mg (500 mg Oral Given 06/01/24 1252)  I did obtain Additional Historical Information from wife at bedside.  I decided to review pertinent External Data, and in summary patient with surgery on 10/21 with Dr. Shyrl.   Clinical Laboratory Tests Ordered, included CBC without leukocytosis.   Consult complete with Vascular Surgery PA Merrianne). Plan to start Keflex as a precaution given DM history. They will coordinate close follow up for  wound check in the office.   Medical Decision Making: Summary:  Patient presents to the ED with leg redness. Not consistent with DVT. Will discuss case with PA on call for cardiothoracic surgery.   Reevaluation with update and discussion with patient. Plan for Keflex as a precaution and f/u with cardiothoracic surgery in the office.   Patient's presentation is most consistent with acute, uncomplicated illness.   Disposition: discharge  ____________________________________________  FINAL CLINICAL IMPRESSION(S) / ED DIAGNOSES  Final diagnoses:  Visit for wound check     NEW OUTPATIENT MEDICATIONS STARTED DURING THIS VISIT:  Discharge Medication List as of 06/01/2024 12:45 PM     START taking these medications   Details  cephALEXin (KEFLEX) 500 MG capsule Take 1 capsule (500 mg total) by mouth 2 (two) times daily for 7 days., Starting Thu 06/01/2024, Until Thu 06/08/2024, Normal        Note:  This document was prepared using Dragon voice recognition software and may include unintentional dictation errors.  Fonda Law, MD, [facility] Emergency Medicine    Dyshawn Cangelosi, Fonda MATSU, MD 06/04/24 [phone]

## 2024-06-01 NOTE — ED Triage Notes (Signed)
 States had heart surgery on 10/21.   Reports redness and swelling on R calf  since last night where they harvested the vein denies drainage or fevers

## 2024-06-01 NOTE — Progress Notes (Addendum)
      [address] [name] [address]             [phone]    I was called by Dr. Darra at [facility] ED regarding erythema around this patient's [facility] site. The patient reports no fevers or purulent drainage but does report swelling. Based on the picture in the chart there is erythema around the incision site that is new. The patient has a past medical history of type 2 diabetes mellitus. Due to this I recommended he provide one week of Keflex for possible infection of the [facility] site. The patient has a telephone appointment with Dr. Shyrl tomorrow and I will also make a 1 week follow up appointment in the office for wound check.   Con GORMAN Bend, PA-C 06/01/24

## 2024-06-02 ENCOUNTER — Ambulatory Visit: Payer: Self-pay

## 2024-06-02 ENCOUNTER — Ambulatory Visit
Payer: Self-pay | Attending: Thoracic Surgery (Cardiothoracic Vascular Surgery) | Admitting: Thoracic Surgery (Cardiothoracic Vascular Surgery)

## 2024-06-02 DIAGNOSIS — Z951 Presence of aortocoronary bypass graft: Secondary | ICD-10-CM

## 2024-06-02 NOTE — Progress Notes (Signed)
     [address]       [name] [address]             [phone]       Patient: Home Provider: Office Consent for Telemedicine visit obtained.  Today's visit was completed via a real-time telehealth (see specific modality noted below). The patient/authorized person provided oral consent at the time of the visit to engage in a telemedicine encounter with the present provider at [facility]. The patient/authorized person was informed of the potential benefits, limitations, and risks of telemedicine. The patient/authorized person expressed understanding that the laws that protect confidentiality also apply to telemedicine. The patient/authorized person acknowledged understanding that telemedicine does not provide emergency services and that he or she would need to call 911 or proceed to the nearest hospital for help if such a need arose.   Total time spent in the clinical discussion 10 minutes.  Telehealth Modality: Phone visit (audio only)  I had a telephone visit with  Matthew Patterson who is s/p CABG.  Overall doing well.  Pain is minimal.  Ambulating well. Vitals have been stable.  Matthew Patterson will see us  back in 1 month with a chest x-ray for cardiac rehab clearance.  Matthew Patterson

## 2024-06-02 NOTE — Telephone Encounter (Signed)
 FYI Only or Action Required?: FYI only for provider: appointment scheduled on 06/07/24.  Patient was last seen in primary care on 05/05/2024 by Matthew Huxley, NP.  Called Nurse Triage reporting Anxiety.  Symptoms began several weeks ago.  Interventions attempted: Prescription medications: Tramadol  for sleep.  Symptoms are: gradually worsening.  Triage Disposition: See Physician Within 24 Hours  Patient/caregiver understands and will follow disposition?: Yes  Copied from CRM [id number]. Topic: Clinical - Red Word Triage >> Jun 02, 2024  4:41 PM Matthew Patterson L wrote: [facility] that prompted transfer to Nurse Triage: patient has so much anxiety until he can't breath Reason for Disposition  Patient sounds very upset or troubled to the triager  Answer Assessment - Initial Assessment Questions Spoke with pts wife (on [place]) and pt briefly. On 10/21 pt had triple bypass by cardiologist. Expressed having a lot of anxiety since surgery to provider and was told by cardiologist to f/u with Dr. Merna. Denies SI. Pt sounds calm and speaking in clear sentences over to phone, no acute respiratory  distress noted. No appt availablity today. Advised UC today to address anxiety, agreeable to go. Pt also reports going to ED yesterday for redness/swelling of left calf where vein was harvested for surgery. Was prescribed Keflex and discharged same day. No change in leg symptoms. Scheduled for soonest f/u appt with PCP at home office on 11/5. Advised ED for worsening symptoms.  1. CONCERN: Did anything happen that prompted you to call today?      Increased anxiety since triple bypass surgery on 10/21  2. ANXIETY SYMPTOMS: Can you describe how you (your loved one; patient) have been feeling? (e.g., tense, restless, panicky, anxious, keyed up, overwhelmed, sense of impending doom).      Episodes of panic attacks with difficulty breathing taking quick short breaths. Feelings of overwhelm. Reports nurses witnessed  pts breathing in the hospital and confirmed he was not hyperventilating.  3. ONSET: How long have you been feeling this way? (e.g., hours, days, weeks)     10/21  4. SEVERITY: How would you rate the level of anxiety? (e.g., 0 - 10; or mild, moderate, severe).     Panic attacks several times per day most days since surgery  5. FUNCTIONAL IMPAIRMENT: How have these feelings affected your ability to do daily activities? Have you had more difficulty than usual doing your normal daily activities? (e.g., getting better, same, worse; self-care, school, work, interactions)     Feeling overwhelmed  6. HISTORY: Have you felt this way before? Have you ever been diagnosed with an anxiety problem in the past? (e.g., generalized anxiety disorder, panic attacks, PTSD). If Yes, ask: How was this problem treated? (e.g., medicines, counseling, etc.)     Had some issues a few years ago but not as severe as current symptoms  7. RISK OF HARM - SUICIDAL IDEATION: Do you ever have thoughts of hurting or killing yourself? If Yes, ask:  Do you have these feelings now? Do you have a plan on how you would do this?     No  8. TREATMENT:  What has been done so far to treat this anxiety? (e.g., medicines, relaxation strategies). What has helped?     Tramadol   10. POTENTIAL TRIGGERS: Do you drink caffeinated beverages (e.g., coffee, colas, teas), and how much daily? Do you drink alcohol or use any drugs? Have you started any new medicines recently?       Recent heart surgery  11. PATIENT SUPPORT: Who is with  you now? Who do you live with? Do you have family or friends who you can talk to?        With wife, currently  79. OTHER SYMPTOMS: Do you have any other symptoms? (e.g., feeling depressed, trouble concentrating, trouble sleeping, trouble breathing, palpitations or fast heartbeat, chest pain, sweating, nausea, or diarrhea)       Wound on left calf where vein was harvested for  triple bypass surgery with redness and swelling. Went to ED yesterday and was prescribed Keflex. No change in symptoms, continuing to take Keflex.  Protocols used: Anxiety and Panic Attack-A-AH

## 2024-06-06 ENCOUNTER — Other Ambulatory Visit: Payer: Self-pay

## 2024-06-06 ENCOUNTER — Encounter (HOSPITAL_BASED_OUTPATIENT_CLINIC_OR_DEPARTMENT_OTHER): Payer: Self-pay | Admitting: Emergency Medicine

## 2024-06-06 ENCOUNTER — Observation Stay (HOSPITAL_BASED_OUTPATIENT_CLINIC_OR_DEPARTMENT_OTHER)
Admission: EM | Admit: 2024-06-06 | Discharge: 2024-06-08 | Disposition: A | Discharge status: Home / Self Care | Attending: Emergency Medicine | Admitting: Emergency Medicine

## 2024-06-06 ENCOUNTER — Emergency Department (HOSPITAL_BASED_OUTPATIENT_CLINIC_OR_DEPARTMENT_OTHER)

## 2024-06-06 DIAGNOSIS — F17210 Nicotine dependence, cigarettes, uncomplicated: Secondary | ICD-10-CM | POA: Diagnosis not present

## 2024-06-06 DIAGNOSIS — I2510 Atherosclerotic heart disease of native coronary artery without angina pectoris: Secondary | ICD-10-CM | POA: Diagnosis not present

## 2024-06-06 DIAGNOSIS — R001 Bradycardia, unspecified: Principal | ICD-10-CM | POA: Insufficient documentation

## 2024-06-06 DIAGNOSIS — E860 Dehydration: Secondary | ICD-10-CM | POA: Insufficient documentation

## 2024-06-06 DIAGNOSIS — Z794 Long term (current) use of insulin: Secondary | ICD-10-CM | POA: Diagnosis not present

## 2024-06-06 DIAGNOSIS — E119 Type 2 diabetes mellitus without complications: Secondary | ICD-10-CM | POA: Insufficient documentation

## 2024-06-06 DIAGNOSIS — I517 Cardiomegaly: Secondary | ICD-10-CM | POA: Diagnosis not present

## 2024-06-06 DIAGNOSIS — E785 Hyperlipidemia, unspecified: Secondary | ICD-10-CM | POA: Insufficient documentation

## 2024-06-06 DIAGNOSIS — J90 Pleural effusion, not elsewhere classified: Secondary | ICD-10-CM | POA: Diagnosis not present

## 2024-06-06 DIAGNOSIS — Z79899 Other long term (current) drug therapy: Secondary | ICD-10-CM | POA: Insufficient documentation

## 2024-06-06 DIAGNOSIS — Z959 Presence of cardiac and vascular implant and graft, unspecified: Secondary | ICD-10-CM | POA: Insufficient documentation

## 2024-06-06 DIAGNOSIS — I959 Hypotension, unspecified: Principal | ICD-10-CM | POA: Insufficient documentation

## 2024-06-06 DIAGNOSIS — Z85820 Personal history of malignant melanoma of skin: Secondary | ICD-10-CM | POA: Diagnosis not present

## 2024-06-06 DIAGNOSIS — Z7982 Long term (current) use of aspirin: Secondary | ICD-10-CM | POA: Diagnosis not present

## 2024-06-06 DIAGNOSIS — L03115 Cellulitis of right lower limb: Secondary | ICD-10-CM | POA: Insufficient documentation

## 2024-06-06 DIAGNOSIS — N179 Acute kidney failure, unspecified: Secondary | ICD-10-CM | POA: Diagnosis not present

## 2024-06-06 DIAGNOSIS — F10920 Alcohol use, unspecified with intoxication, uncomplicated: Secondary | ICD-10-CM | POA: Diagnosis not present

## 2024-06-06 DIAGNOSIS — R42 Dizziness and giddiness: Secondary | ICD-10-CM | POA: Diagnosis not present

## 2024-06-06 LAB — HEPATIC FUNCTION PANEL
ALT: 16 U/L (ref 0–44)
AST: 23 U/L (ref 15–41)
Albumin: 4 g/dL (ref 3.5–5.0)
Alkaline Phosphatase: 56 U/L (ref 38–126)
Bilirubin, Direct: 0.2 mg/dL (ref 0.0–0.2)
Indirect Bilirubin: 0.2 mg/dL — ABNORMAL LOW (ref 0.3–0.9)
Total Bilirubin: 0.4 mg/dL (ref 0.0–1.2)
Total Protein: 7.4 g/dL (ref 6.5–8.1)

## 2024-06-06 LAB — CBC
HCT: 36.8 % — ABNORMAL LOW (ref 39.0–52.0)
Hemoglobin: 12 g/dL — ABNORMAL LOW (ref 13.0–17.0)
MCH: 32 pg (ref 26.0–34.0)
MCHC: 32.6 g/dL (ref 30.0–36.0)
MCV: 98.1 fL (ref 80.0–100.0)
Platelets: 495 K/uL — ABNORMAL HIGH (ref 150–400)
RBC: 3.75 MIL/uL — ABNORMAL LOW (ref 4.22–5.81)
RDW: 13.6 % (ref 11.5–15.5)
WBC: 11.2 K/uL — ABNORMAL HIGH (ref 4.0–10.5)
nRBC: 0 % (ref 0.0–0.2)

## 2024-06-06 LAB — BASIC METABOLIC PANEL WITH GFR
Anion gap: 12 (ref 5–15)
BUN: 35 mg/dL — ABNORMAL HIGH (ref 8–23)
CO2: 25 mmol/L (ref 22–32)
Calcium: 9.5 mg/dL (ref 8.9–10.3)
Chloride: 102 mmol/L (ref 98–111)
Creatinine, Ser: 1.75 mg/dL — ABNORMAL HIGH (ref 0.61–1.24)
GFR, Estimated: 42 mL/min — ABNORMAL LOW (ref >60)
Glucose, Bld: 123 mg/dL — ABNORMAL HIGH (ref 70–99)
Potassium: 4.8 mmol/L (ref 3.5–5.1)
Sodium: 138 mmol/L (ref 135–145)

## 2024-06-06 LAB — GLUCOSE, CAPILLARY: Glucose-Capillary: 98 mg/dL (ref 70–99)

## 2024-06-06 LAB — TROPONIN T, HIGH SENSITIVITY
Troponin T High Sensitivity: 32 ng/L — ABNORMAL HIGH (ref 0–19)
Troponin T High Sensitivity: 27 ng/L — ABNORMAL HIGH (ref 0–19)

## 2024-06-06 LAB — LACTIC ACID, PLASMA: Lactic Acid, Venous: 0.9 mmol/L (ref 0.5–1.9)

## 2024-06-06 LAB — PROCALCITONIN: Procalcitonin: <0.1 ng/mL

## 2024-06-06 MED ORDER — LACTATED RINGERS IV BOLUS
500.0000 mL | Freq: Once | INTRAVENOUS | Status: AC
  Administered 2024-06-06: 500 mL via INTRAVENOUS

## 2024-06-06 MED ORDER — LACTATED RINGERS IV SOLN: INTRAVENOUS | Status: AC

## 2024-06-06 NOTE — Progress Notes (Signed)
 Patient discussed with ER staff, presents with hypotension from home with HRs in the 40s to 50s. Mild elevated WBC, AKI. Has been on amio and metoprolol after recent CABG and postop afib of which he converted to SR prior to discharge.  For the bradycardia would stop his amiodarone and metoprolol, follow rates as these wash out. Would recommend observation overnight to medicine team to follow AKI, bp's, heart rates. Would add lactic acid and procalcitonin to labs, would want to make sure no signs of possible developing sepsis. Add on hepatic panel to evaluate for any additional signs of organ dysfunction.  Benign echo, would be fine for for IV fluids overnight. No additional cardiology specific recommendations at this time, if bradycardia, bp and AKI resolving tomorrow without other signs of sepsis would likely be ok for discharge. Mild trop after recent CABG and current hypotension not a pattern suggestive of ACS.  Can call us  back if questions during admission.     Dorn Ross MD

## 2024-06-06 NOTE — Progress Notes (Deleted)
 Cardiology Office Note Date:  06/06/2024  ID:  Matthew, Patterson 1956/07/13, MRN [id number] PCP:  Merna Huxley, NP  Cardiologist:  Joelle VEAR Ren Donley, MD  No chief complaint on file.     Problems CAD s/p CABG x 3 LAD, OM1, OM2 10/25 TTE 10/25 60-65% Post-op Afib on PO amiodarone DM Early family Hx of ASCVD M: AE200, ASA 325, MT25BID, RN40 LDL 71 10/25  Visits  11/25    History of Present Illness: Matthew Patterson is a 68 y.o. male who presents for post hospitalization.   ROS: Please see the history of present illness. All other systems are reviewed and negative.   Past Medical History:  Diagnosis Date   Anxiety and depression    Diabetes mellitus without complication (HCC)    GERD (gastroesophageal reflux disease)    Hyperlipidemia    Melanoma (HCC) 2023   back    Past Surgical History:  Procedure Laterality Date   CORONARY ARTERY BYPASS GRAFT N/A 05/23/2024   Procedure: CORONARY ARTERY BYPASS GRAFTING (CABG) X THREE, USING LEFT INTERNAL MAMMARY ARTERY AND RIGHT LEG GREATER SAPHENOUS VEIN HARVESTED ENDOSCOPICALLY;  Surgeon: Shyrl Linnie KIDD, MD;  Location: [facility] OR;  Service: Open Heart Surgery;  Laterality: N/A;   RIGHT/LEFT HEART CATH AND CORONARY ANGIOGRAPHY N/A 05/19/2024   Procedure: RIGHT/LEFT HEART CATH AND CORONARY ANGIOGRAPHY;  Surgeon: Jordan, Peter M, MD;  Location: [facility] INVASIVE CV LAB;  Service: Cardiovascular;  Laterality: N/A;   ROTATOR CUFF REPAIR     SHOULDER SURGERY  2010   B/L    Current Outpatient Medications  Medication Sig Dispense Refill   acetaminophen  (TYLENOL ) 325 MG tablet Take 2 tablets (650 mg total) by mouth every 6 (six) hours as needed.     amiodarone (PACERONE) 200 MG tablet Take 2 tablets (400 mg total) by mouth 2 (two) times daily. For 10 days then reduce the dose to 1 tablet by mouth twice daily for 14 days then reduce the dose to 1 tablet daily 80 tablet 2   aspirin EC 325 MG tablet Take 1 tablet (325 mg total) by  mouth daily. 100 tablet 3   Blood Glucose Monitoring Suppl (ONETOUCH VERIO IQ SYSTEM) w/Device KIT 1 kit 1 kit 0   cephALEXin (KEFLEX) 500 MG capsule Take 1 capsule (500 mg total) by mouth 2 (two) times daily for 7 days. 14 capsule 0   Continuous Glucose Sensor (FREESTYLE LIBRE 3 PLUS SENSOR) MISC 1 Device by Other route every 14 (fourteen) days. Change sensor every 15 days. 6 each 3   fenofibrate  (TRICOR ) 145 MG tablet TAKE 1 TABLET BY MOUTH DAILY 90 tablet 3   glucose blood test strip Use as instructed 100 each 12   Lancets (ONETOUCH ULTRASOFT) lancets Use as instructed 100 each 12   metFORMIN  (GLUCOPHAGE -XR) 750 MG 24 hr tablet Take 1 tablet (750 mg total) by mouth daily with breakfast. 90 tablet 3   metoprolol tartrate (LOPRESSOR) 25 MG tablet Take 1 tablet (25 mg total) by mouth 2 (two) times daily. 60 tablet 5   rosuvastatin  (CRESTOR ) 40 MG tablet Take 1 tablet (40 mg total) by mouth daily. 90 tablet 3   traZODone  (DESYREL ) 100 MG tablet TAKE 1 TABLET BY MOUTH AT BEDTIME 90 tablet 1   No current facility-administered medications for this visit.    Allergies:   Patient has no known allergies.   Social History:  see above  Family History:  see above  PHYSICAL EXAM: VS:  There  were no vitals taken for this visit. , BMI There is no height or weight on file to calculate BMI. GEN: Well nourished, well developed, in no acute distress HEENT: normal Neck: no JVD, carotid bruits, or masses Cardiac: ***RRR; no murmurs, rubs, or gallops,no edema  Respiratory:  CTAB bilaterally, normal work of breathing GI: soft, nontender, nondistended, + BS Extremities: No LE edema Skin: warm and dry, no rash Neuro:  Strength and sensation are intact  EKG: ***  Recent Labs: Reviewed  Studies: Reviewed  ASSESSMENT AND PLAN: ***  #*** #*** #*** #***   Signed, Joelle VEAR Ren Donley, MD  06/06/2024 8:35 AM    [facility]

## 2024-06-06 NOTE — Progress Notes (Signed)
 Hospitalist Transfer Note:    Nursing staff, Please call [facility] Admits & Consults System-Wide number on Amion [phone]) as soon as patient's arrival, so appropriate admitting provider can evaluate the pt.   Transferring facility: [facility] Requesting provider: Dr. Doretha (EDP at [facility]) Reason for transfer: admission for further evaluation and management of acute kidney injury, sinus bradycardia in the setting of recently initiated amiodarone and metoprolol.      68 year old male with history of diabetes, coronary artery disease status post two-vessel CABG on 05/23/2024, who presented to [facility] ED complaining of dizziness.   The patient underwent two-vessel CABG on 05/23/2024.  Postoperatively, he developed new onset atrial fibrillation prompting initiation of amiodarone as well as metoprolol, following which he converted to sinus rhythm.  He was subsequently discharged to home on recently initiated amiodarone and metoprolol.  No known history of hypertension, and is otherwise not on any antihypertensive medications at home.  He had presented back to the emergency department on 06/01/2024 for concern regarding erythema in the lower extremity relating to recent venous harvesting site, was started on Keflex.  At the time of that presentation to the ED, his blood pressure was noted to be 100/60, with heart rate in the 40s.  Was discharged to home from the ED on 06/01/2024.  Today, the patient is felt some mild dizziness when rising from a seated to standing position.  This has not been associated with any fall, nor any subjective sensation of impending loss of consciousness or formal loss of consciousness.  Denies any associated chest pain, shortness of breath, palpitations, diaphoresis.  Over the last few days, he notes a slight decline in oral intake, but denies any recent nausea, vomiting, or diarrhea.  He has a home blood pressure monitoring cuff, and checked his vitals at home today, is prompted by  new onset dizziness when rising from a seated to standing position, at which time he noted his systolic blood pressures to be in the 80s, with heart rates remaining in the 40s, consistent with his heart rates over the course of the last week.  He separately presented to [facility] for evaluation of the above.  Vital signs in the ED were notable for the following: Afebrile; systolic blood pressures initially in the 90s, which have increased into the 130s following interval IV fluids; heart rates stable remaining in the 40s, associated with sinus bradycardia.  Respiratory rate 15-17 and oxygen saturation 98 to 99% on room air.  Labs were notable for the following: in the the context of his recent two-vessel CABG, initial troponin was 32, with repeat trending down to 27.  BMP shows creatinine of 1.75, compared to 1.3 on 06/01/2024. Creatinine reported to 1.0 prior to that.   EKG is reported to show sinus bradycardia without evidence of acute ischemic changes.  Imaging notable for 2 view chest x-ray which shows a small left pleural effusion, but otherwise no evidence of acute cardiopulmonary process.  EDP d/w on-call cardiology, Dr. Alvan, who recommended obs to the Hospitalists, holding both amiodarone and metoprolol, and monitoring HR overnight; Dr. Alvan conveyed that cardiology is available, as needed, if formal consult is need.   Medications administered prior to transfer included the following: Lactated Ringer 's x 1 L bolus, followed by initiation continuous LR running at 125 cc/h.  Subsequently, I accepted this patient for transfer for observation to a pcu bed at [facility] for further work-up and management of the above.      Eva Pore, DO Hospitalist

## 2024-06-06 NOTE — ED Triage Notes (Signed)
 Pt reports CABG 10/21; c/o dizziness, hypotension since this AM. Used home bp cuff with multiple reads of systolic in 80's. Pts spouse also reports pt is pale today.   Denies known sick contacts. AOx4

## 2024-06-06 NOTE — ED Notes (Signed)
 Pt transported to xray at this time

## 2024-06-06 NOTE — ED Notes (Signed)
 Pt transferred from [facility] to ED RM 9. Assuming pt care at this time.

## 2024-06-06 NOTE — ED Provider Notes (Signed)
 [facility] Provider Note   CSN: [id number] Arrival date & time: 06/06/24  1539     Patient presents with: Hypotension and Dizziness   Matthew Patterson is a 68 y.o. male.   Patient is a 68 year old male with a history significant for two-vessel CAD status post CABG on 05/23/2024, diabetes, hyperlipidemia who is presenting today with complaints of feeling lightheaded and his wife felt that he looked pale room and they checked his blood pressure at home it was low.  Patient reports that he has overall been doing pretty well since he was discharged from the hospital.  He was seen last week due to concern that his harvest site was getting infected and he was started on Keflex but he reports that is getting much better.  His wife reports that yesterday he was up doing a lot more than he had done since the surgery but today he slept late and then he reports that since being awake he will feel lightheaded when he gets up and tries to do anything and his wife's thought he looked more pale.  They have a blood pressure cuff at home and when they checked his blood pressure it was saying 80s systolic with a heart rate in the 40s.  He has not checked his blood pressure since leaving the hospital but does report that he will check his heart rate and it is usually in the 40s and 50s.  He was started on new blood pressure medications before leaving including amiodarone and metoprolol.  He has not had any chest pain or shortness of breath today.  He denies any nausea vomiting or abdominal pain.  He has not noticed any leg swelling.  He has been compliant with his medications.  He has also been monitoring his blood sugar which has been normal.  He does not think he is dehydrated but reports he has not eat or drink much today.  He has not had any fevers or cough.  He denies any black stools.  The history is provided by the patient, the spouse and medical records.   Dizziness      Prior to Admission medications   Medication Sig Start Date End Date Taking? Authorizing Provider  acetaminophen  (TYLENOL ) 325 MG tablet Take 2 tablets (650 mg total) by mouth every 6 (six) hours as needed. 05/28/24   Roddenberry, Myron G, PA-C  amiodarone (PACERONE) 200 MG tablet Take 2 tablets (400 mg total) by mouth 2 (two) times daily. For 10 days then reduce the dose to 1 tablet by mouth twice daily for 14 days then reduce the dose to 1 tablet daily 05/28/24   Roddenberry, Myron G, PA-C  aspirin EC 325 MG tablet Take 1 tablet (325 mg total) by mouth daily. 05/29/24   Roddenberry, Myron G, PA-C  Blood Glucose Monitoring Suppl (ONETOUCH VERIO IQ SYSTEM) w/Device KIT 1 kit 12/29/16   Nafziger, Darleene, NP  cephALEXin (KEFLEX) 500 MG capsule Take 1 capsule (500 mg total) by mouth 2 (two) times daily for 7 days. 06/01/24 06/08/24  Long, Fonda MATSU, MD  Continuous Glucose Sensor (FREESTYLE LIBRE 3 PLUS SENSOR) MISC 1 Device by Other route every 14 (fourteen) days. Change sensor every 15 days. 06/03/23   Shamleffer, Ibtehal Jaralla, MD  fenofibrate  (TRICOR ) 145 MG tablet TAKE 1 TABLET BY MOUTH DAILY 06/23/23   Nafziger, Darleene, NP  glucose blood test strip Use as instructed 12/29/16   Merna Darleene, NP  Lancets (ONETOUCH ULTRASOFT)  lancets Use as instructed 12/29/16   Nafziger, Cory, NP  metFORMIN  (GLUCOPHAGE -XR) 750 MG 24 hr tablet Take 1 tablet (750 mg total) by mouth daily with breakfast. 12/02/23   Shamleffer, Ibtehal Jaralla, MD  metoprolol tartrate (LOPRESSOR) 25 MG tablet Take 1 tablet (25 mg total) by mouth 2 (two) times daily. 05/28/24   Roddenberry, Myron G, PA-C  rosuvastatin  (CRESTOR ) 40 MG tablet Take 1 tablet (40 mg total) by mouth daily. 12/06/23   Shamleffer, Ibtehal Jaralla, MD  traZODone  (DESYREL ) 100 MG tablet TAKE 1 TABLET BY MOUTH AT BEDTIME 12/16/23   Nafziger, Cory, NP    Allergies: Patient has no known allergies.    Review of Systems  Neurological:  Positive for  dizziness.    Updated Vital Signs BP (!) 97/59 (BP Location: Right Arm)   Pulse (!) 40   Temp (!) 97.5 F (36.4 C) (Oral)   Resp 12   Ht 5' 8 (1.727 m)   Wt 76.7 kg   SpO2 98%   BMI 25.70 kg/m   Physical Exam Vitals and nursing note reviewed.  Constitutional:      General: He is not in acute distress.    Appearance: He is well-developed.  HENT:     Head: Normocephalic and atraumatic.  Eyes:     Conjunctiva/sclera: Conjunctivae normal.     Pupils: Pupils are equal, round, and reactive to light.  Cardiovascular:     Rate and Rhythm: Regular rhythm. Bradycardia present.     Heart sounds: No murmur heard. Pulmonary:     Effort: Pulmonary effort is normal. No respiratory distress.     Breath sounds: Normal breath sounds. No wheezing or rales.     Comments: Healing sternotomy scar Abdominal:     General: There is no distension.     Palpations: Abdomen is soft.     Tenderness: There is no abdominal tenderness. There is no guarding or rebound.  Musculoskeletal:        General: No tenderness. Normal range of motion.     Cervical back: Normal range of motion and neck supple.     Right lower leg: No edema.     Left lower leg: No edema.     Comments: Harvest site on the right leg has some minimal erythema and scabbing but no overt signs of cellulitis.  No distal leg edema  Skin:    General: Skin is warm and dry.     Findings: No erythema or rash.  Neurological:     Mental Status: He is alert and oriented to person, place, and time. Mental status is at baseline.  Psychiatric:        Mood and Affect: Mood normal.        Behavior: Behavior normal.     (all labs ordered are listed, but only abnormal results are displayed) Labs Reviewed  BASIC METABOLIC PANEL WITH GFR - Abnormal; Notable for the following components:      Result Value   Glucose, Bld 123 (*)    BUN 35 (*)    Creatinine, Ser 1.75 (*)    GFR, Estimated 42 (*)    All other components within normal limits   CBC - Abnormal; Notable for the following components:   WBC 11.2 (*)    RBC 3.75 (*)    Hemoglobin 12.0 (*)    HCT 36.8 (*)    Platelets 495 (*)    All other components within normal limits  TROPONIN T, HIGH SENSITIVITY - Abnormal; Notable for  the following components:   Troponin T High Sensitivity 32 (*)    All other components within normal limits  TROPONIN T, HIGH SENSITIVITY - Abnormal; Notable for the following components:   Troponin T High Sensitivity 27 (*)    All other components within normal limits  GLUCOSE, CAPILLARY  LACTIC ACID, PLASMA  PROCALCITONIN  HEPATIC FUNCTION PANEL    EKG: None  Radiology: DG Chest 2 View Result Date: 06/06/2024 EXAM: 2 VIEW(S) XRAY OF THE CHEST 06/06/2024 04:30:10 PM COMPARISON: Chest x-ray 05/25/2024. CLINICAL HISTORY: hypotension hypotension FINDINGS: LUNGS AND PLEURA: No focal pulmonary opacity. No pulmonary edema. There is scarring in the left lung base. There is a small left pleural effusion. No pneumothorax. HEART AND MEDIASTINUM: Surrounding wires are again seen. The heart is mildly enlarged. BONES AND SOFT TISSUES: No acute osseous abnormality. IMPRESSION: 1. Small left pleural effusion. 2. Scarring in the left lung base. 3. Mildly enlarged heart. Electronically signed by: Greig Pique MD 06/06/2024 05:04 PM EST RP Workstation: [id number]     Procedures   Medications Ordered in the ED  lactated ringers  infusion (has no administration in time range)  lactated ringers  bolus 500 mL (0 mLs Intravenous Stopped 06/06/24 1701)  lactated ringers  bolus 500 mL (0 mLs Intravenous Stopped 06/06/24 1827)                                    Medical Decision Making Amount and/or Complexity of Data Reviewed Independent Historian: spouse External Data Reviewed: notes. Labs: ordered. Decision-making details documented in ED Course. Radiology: ordered and independent interpretation performed. Decision-making details documented in ED  Course. ECG/medicine tests: ordered and independent interpretation performed. Decision-making details documented in ED Course.  Risk Prescription drug management. Decision regarding hospitalization.   Pt with multiple medical problems and comorbidities and presenting today with a complaint that caries a high risk for morbidity and mortality.  Here today with the above complaint.  Concern for AKI, electrolyte abnormality, dehydration, dysrhythmia, anemia, adverse reaction to medication.  Patient is mentating normally and his symptoms of dizziness are not classic for strokelike symptoms.  Patient's home monitor was reading in the 80s systolic.  Here it has been 95-100 systolic.  He was seen here 06/01/24 and at that time had HR in the 40's and blood pressure of 100/65.  Pt is mentating well and denies any chest pain or SOB.  Low suspicion for fluid overload or PE.  His bradycardia has been present since d/c most likely from new betablocker and he is still on his amiodarone and has not started the taper yet.  7:05 PM I independently interpreted patient's labs and EKG.  EKG does show some T wave inversion in the lateral leads without a new EKG since his bypass.  CBC today without acute findings except for a white count of 11 and with stable hemoglobin, BMP with worsening AKI with creatinine of 1.75 from 1.36 several days ago.  Troponin is 32 and will get a repeat which is most likely from recent heart surgery.  I have independently visualized and interpreted pt's images today. CXR with improved effusion.  Radiology reports slightly enlarged heart. Patient remains bradycardic throughout his stay here and blood pressure has been in the low 100s and upper 90s.  Patient's metoprolol and amiodarone are new and suspect he may need to decrease his dose of metoprolol given his heart rate in the 40s. With patient's AKI, persistent  bradycardia and hypotension discussed with Dr. Alvan with cardiology and he  recommends holding the amiodarone and the metoprolol at this time.  Patient will be admitted to the hospitalist service for ongoing hydration and rechecking of his renal function and to ensure blood pressure and heart rate improved with washout of medication.  Patient remains chest pain free and still denies shortness of breath.  He second troponin is improved to 27.  Discussed the plan with the patient and his wife and they are comfortable with this plan.     Final diagnoses:  Bradycardia  AKI (acute kidney injury)    ED Discharge Orders     None          Doretha Folks, MD 06/06/24 1905

## 2024-06-07 ENCOUNTER — Inpatient Hospital Stay: Admitting: Adult Health

## 2024-06-07 ENCOUNTER — Emergency Department: Payer: Self-pay

## 2024-06-07 ENCOUNTER — Ambulatory Visit: Admitting: Vascular Surgery

## 2024-06-07 DIAGNOSIS — E119 Type 2 diabetes mellitus without complications: Secondary | ICD-10-CM | POA: Diagnosis not present

## 2024-06-07 DIAGNOSIS — R001 Bradycardia, unspecified: Secondary | ICD-10-CM | POA: Diagnosis not present

## 2024-06-07 DIAGNOSIS — N179 Acute kidney failure, unspecified: Secondary | ICD-10-CM | POA: Diagnosis not present

## 2024-06-07 DIAGNOSIS — I959 Hypotension, unspecified: Secondary | ICD-10-CM | POA: Diagnosis not present

## 2024-06-07 DIAGNOSIS — E785 Hyperlipidemia, unspecified: Secondary | ICD-10-CM

## 2024-06-07 DIAGNOSIS — E860 Dehydration: Secondary | ICD-10-CM | POA: Diagnosis not present

## 2024-06-07 DIAGNOSIS — I2510 Atherosclerotic heart disease of native coronary artery without angina pectoris: Secondary | ICD-10-CM | POA: Diagnosis not present

## 2024-06-07 DIAGNOSIS — R531 Weakness: Secondary | ICD-10-CM | POA: Diagnosis not present

## 2024-06-07 DIAGNOSIS — Z743 Need for continuous supervision: Secondary | ICD-10-CM | POA: Diagnosis not present

## 2024-06-07 MED ORDER — SODIUM CHLORIDE 0.9 % IV SOLN
1.0000 g | Freq: Once | INTRAVENOUS | Status: AC
  Administered 2024-06-07: 1 g via INTRAVENOUS
  Filled 2024-06-07: qty 10

## 2024-06-07 MED ORDER — ASPIRIN 81 MG PO CHEW
81.0000 mg | CHEWABLE_TABLET | Freq: Every day | ORAL | Status: DC
  Administered 2024-06-07 – 2024-06-08 (×2): 81 mg via ORAL
  Filled 2024-06-07 (×2): qty 1

## 2024-06-07 MED ORDER — TRAZODONE HCL 100 MG PO TABS
100.0000 mg | ORAL_TABLET | Freq: Every day | ORAL | Status: DC
  Administered 2024-06-07: 100 mg via ORAL
  Filled 2024-06-07: qty 1

## 2024-06-07 MED ORDER — ROSUVASTATIN CALCIUM 20 MG PO TABS
40.0000 mg | ORAL_TABLET | Freq: Every day | ORAL | Status: DC
  Administered 2024-06-07 – 2024-06-08 (×2): 40 mg via ORAL
  Filled 2024-06-07 (×2): qty 2

## 2024-06-07 NOTE — Progress Notes (Signed)
 Patient arrived to 42E03 from [facility] via [facility]. Placed on monitor CCMD notified. Pt has no complaints of pain. Vital signs stable. Paged [facility] to notify of patients arrival.

## 2024-06-07 NOTE — H&P (Signed)
 History and Physical    Patient: Matthew Patterson FMW:[id number] DOB: October 28, 1955 DOA: 06/06/2024 DOS: the patient was seen and examined on 06/07/2024 PCP: Merna Huxley, NP  Patient coming from: Home  Chief Complaint:  Chief Complaint  Patient presents with   Hypotension   Dizziness   HPI: Matthew Patterson is a 68 y.o. male with medical history significant of melanoma, HTN, HLD, DM2, and CAD s/p 3 vessel CABG (LIMA to LAD, SVG OM1, SVG OM2) on 05/23/2024 c/b PAF post-op and started on metoprolol and amiodarone in that setting p/w symptomatic bradycardia.  The patient presented with lightheadedness and low blood pressure, which started the previous afternoon around 3:30 PM at which time he decided to present to the ED for evaluation. The patient reported no prior symptoms of weakness or illness before the onset of lightheadedness. There was no specific activity at the time of onset, but the day before, the patient had been moving light items around the kitchen. The patient utilized a blood pressure monitor at home and noted both blood pressure and heart rate were low. The patient started two new medications after a procedure in October, which might have contributed to the symptoms. The patient's heart rate historically ranged around 54 beats per minute, and the patient is generally active, walking daily and playing golf.   Of note,pt returned to ED on 06/01/2024 for concern regarding erythema in the lower extremity relating to recent venous harvesting site, was started on Keflex.  At the time of that presentation to the ED, his blood pressure was noted to be 100/60, with heart rate in the 40s.  Was discharged to home from the ED on 06/01/2024.   Vital signs in the ED were notable for the following: Afebrile; systolic blood pressures initially in the 90s, which have increased into the 130s following interval IV fluids; heart rates stable remaining in the 40s, associated with sinus bradycardia.   Respiratory rate 15-17 and oxygen saturation 98 to 99% on room air.   Labs were notable for the following: in the the context of his recent two-vessel CABG, initial troponin was 32, with repeat trending down to 27.  BMP shows creatinine of 1.75, compared to 1.3 on 06/01/2024. Creatinine reported to 1.0 prior to that.    EKG is reported to show sinus bradycardia without evidence of acute ischemic changes.   Imaging notable for 2 view chest x-ray which shows a small left pleural effusion, but otherwise no evidence of acute cardiopulmonary process.   EDP d/w on-call cardiology, Dr. Alvan, who recommended obs to the Hospitalists, holding both amiodarone and metoprolol, and monitoring HR overnight; Dr. Alvan conveyed that cardiology is available, as needed, if formal consult is need.    Medications administered prior to transfer included the following: Lactated Ringer 's x 1 L bolus, followed by initiation continuous LR running at 125 cc/h.   Subsequently, pt accepted to PCU for w/u of above.   Review of Systems: As mentioned in the history of present illness. All other systems reviewed and are negative. Past Medical History:  Diagnosis Date   Anxiety and depression    Diabetes mellitus without complication (HCC)    GERD (gastroesophageal reflux disease)    Hyperlipidemia    Melanoma (HCC) 2023   back   Past Surgical History:  Procedure Laterality Date   CORONARY ARTERY BYPASS GRAFT N/A 05/23/2024   Procedure: CORONARY ARTERY BYPASS GRAFTING (CABG) X THREE, USING LEFT INTERNAL MAMMARY ARTERY AND RIGHT LEG GREATER SAPHENOUS VEIN HARVESTED  ENDOSCOPICALLY;  Surgeon: Shyrl Linnie KIDD, MD;  Location: [facility] OR;  Service: Open Heart Surgery;  Laterality: N/A;   RIGHT/LEFT HEART CATH AND CORONARY ANGIOGRAPHY N/A 05/19/2024   Procedure: RIGHT/LEFT HEART CATH AND CORONARY ANGIOGRAPHY;  Surgeon: Jordan, Peter M, MD;  Location: [facility] INVASIVE CV LAB;  Service: Cardiovascular;  Laterality: N/A;   ROTATOR  CUFF REPAIR     SHOULDER SURGERY  2010   B/L   Social History:  reports that he has been smoking cigarettes and cigars. He started smoking about 15 years ago. He has never used smokeless tobacco. He reports current alcohol use of about 1.0 - 2.0 standard drink of alcohol per week. He reports that he does not use drugs.  No Known Allergies  Family History  Problem Relation Age of Onset   Diabetes Mother    Hypertension Mother    Cancer Sister        uterine cancer    Prior to Admission medications   Medication Sig Start Date End Date Taking? Authorizing Provider  acetaminophen  (TYLENOL ) 325 MG tablet Take 2 tablets (650 mg total) by mouth every 6 (six) hours as needed. 05/28/24   Roddenberry, Myron G, PA-C  amiodarone (PACERONE) 200 MG tablet Take 2 tablets (400 mg total) by mouth 2 (two) times daily. For 10 days then reduce the dose to 1 tablet by mouth twice daily for 14 days then reduce the dose to 1 tablet daily 05/28/24   Roddenberry, Myron G, PA-C  aspirin EC 325 MG tablet Take 1 tablet (325 mg total) by mouth daily. 05/29/24   Roddenberry, Myron G, PA-C  Blood Glucose Monitoring Suppl (ONETOUCH VERIO IQ SYSTEM) w/Device KIT 1 kit 12/29/16   Nafziger, Darleene, NP  cephALEXin (KEFLEX) 500 MG capsule Take 1 capsule (500 mg total) by mouth 2 (two) times daily for 7 days. 06/01/24 06/08/24  Long, Fonda MATSU, MD  Continuous Glucose Sensor (FREESTYLE LIBRE 3 PLUS SENSOR) MISC 1 Device by Other route every 14 (fourteen) days. Change sensor every 15 days. 06/03/23   Shamleffer, Ibtehal Jaralla, MD  fenofibrate  (TRICOR ) 145 MG tablet TAKE 1 TABLET BY MOUTH DAILY 06/23/23   Nafziger, Darleene, NP  glucose blood test strip Use as instructed 12/29/16   Merna Darleene, NP  Lancets [facility] ULTRASOFT) lancets Use as instructed 12/29/16   Merna Darleene, NP  metFORMIN  (GLUCOPHAGE -XR) 750 MG 24 hr tablet Take 1 tablet (750 mg total) by mouth daily with breakfast. 12/02/23   Shamleffer, Ibtehal Jaralla, MD   metoprolol tartrate (LOPRESSOR) 25 MG tablet Take 1 tablet (25 mg total) by mouth 2 (two) times daily. 05/28/24   Roddenberry, Myron G, PA-C  rosuvastatin  (CRESTOR ) 40 MG tablet Take 1 tablet (40 mg total) by mouth daily. 12/06/23   Shamleffer, Donell Cardinal, MD  traZODone  (DESYREL ) 100 MG tablet TAKE 1 TABLET BY MOUTH AT BEDTIME 12/16/23   Merna Darleene, NP    Physical Exam: Vitals:   06/07/24 0145 06/07/24 0257 06/07/24 0259 06/07/24 0724  BP: (!) 98/53 131/60  127/75  Pulse: (!) 47 60 (!) 49 (!) 59  Resp: 15 20    Temp:  97.9 F (36.6 C)  97.7 F (36.5 C)  TempSrc:  Oral  Oral  SpO2: 99% 99%  100%  Weight:   80.1 kg   Height:  5' 8 (1.727 m)     General: Alert, oriented x3, resting comfortably in no acute distress HEENT: EOMI, oropharynx clear, moist mucous membranes, hearing intact Neck: Trachea midline and no gross  thyromegaly Respiratory: Lungs clear to auscultation bilaterally with normal respiratory effort; no w/r/r Cardiovascular: Regular rate and rhythm w/o m/r/g Abdomen: Soft, nontender, nondistended. Positive bowel sounds MSK: No obvious joint deformities or swelling Skin: No obvious rashes or lesions Neurologic: Awake, alert, spontaneously moves all extremities, strength intact Psychiatric: Appropriate mood and affect, conversational and cooperative   Data Reviewed:  Lab Results  Component Value Date   WBC 11.2 (H) 06/06/2024   HGB 12.0 (L) 06/06/2024   HCT 36.8 (L) 06/06/2024   MCV 98.1 06/06/2024   PLT 495 (H) 06/06/2024   Lab Results  Component Value Date   GLUCOSE 123 (H) 06/06/2024   CALCIUM  9.5 06/06/2024   NA 138 06/06/2024   K 4.8 06/06/2024   CO2 25 06/06/2024   CL 102 06/06/2024   BUN 35 (H) 06/06/2024   CREATININE 1.75 (H) 06/06/2024   Lab Results  Component Value Date   ALT 16 06/06/2024   AST 23 06/06/2024   ALKPHOS 56 06/06/2024   BILITOT 0.4 06/06/2024   Lab Results  Component Value Date   INR 1.3 (H) 05/23/2024   INR 1.1  05/23/2024   INR 1.0 05/22/2024   Radiology: DG Chest 2 View Result Date: 06/06/2024 EXAM: 2 VIEW(S) XRAY OF THE CHEST 06/06/2024 04:30:10 PM COMPARISON: Chest x-ray 05/25/2024. CLINICAL HISTORY: hypotension hypotension FINDINGS: LUNGS AND PLEURA: No focal pulmonary opacity. No pulmonary edema. There is scarring in the left lung base. There is a small left pleural effusion. No pneumothorax. HEART AND MEDIASTINUM: Surrounding wires are again seen. The heart is mildly enlarged. BONES AND SOFT TISSUES: No acute osseous abnormality. IMPRESSION: 1. Small left pleural effusion. 2. Scarring in the left lung base. 3. Mildly enlarged heart. Electronically signed by: Greig Pique MD 06/06/2024 05:04 PM EST RP Workstation: [id number]    Assessment and Plan: 87M h/o melanoma, HTN, HLD, DM2, and CAD s/p 3 vessel CABG (LIMA to LAD, SVG OM1, SVG OM2) on 05/23/2024 c/b PAF post-op and started on metoprolol and amiodarone in that setting p/w symptomatic bradycardia.  Symptomatic bradycardia H/o 3 vessel CABG c/b PAF TSH wnl -Cards consulted; apprec eval/recs -Agree with holding pta amiodarone and metoprolol for now -Agree with zio patch at discharge -Will need OP Cards f/u -Will need OP CTS f/u (missed appt today)  RLE cellulitis s/p vein harvesting -IV CTX 1 daily x1; ok to resume pta Keflex at d/c  HLD -PTA Crestor  40mg  daily  DM2 -PTA metformin     Advance Care Planning:   Code Status: Full Code   Consults: Cards  Family Communication: Wife  Severity of Illness: The appropriate patient status for this patient is INPATIENT. Inpatient status is judged to be reasonable and necessary in order to provide the required intensity of service to ensure the patient's safety. The patient's presenting symptoms, physical exam findings, and initial radiographic and laboratory data in the context of their chronic comorbidities is felt to place them at high risk for further clinical deterioration. Furthermore,  it is not anticipated that the patient will be medically stable for discharge from the hospital within 2 midnights of admission.   * I certify that at the point of admission it is my clinical judgment that the patient will require inpatient hospital care spanning beyond 2 midnights from the point of admission due to high intensity of service, high risk for further deterioration and high frequency of surveillance required.*   ------- I spent 55 minutes reviewing previous notes, at the bedside counseling/discussing the treatment plan, and  performing clinical documentation.  Author: Marsha Ada, MD 06/07/2024 7:55 AM  For on call review [email or web address].

## 2024-06-07 NOTE — Consult Note (Signed)
 CARDIOLOGY CONSULT NOTE       Patient ID: Matthew Patterson MRN: [id number] DOB/AGE: [birth year] 68 y.o.  Admit date: 06/06/2024 Referring Physician: Georgina Primary Physician: Merna Huxley, NP Primary Cardiologist: Sullivan/Lightfoot Reason for Consultation: Bradycardia  Principal Problem:   AKI (acute kidney injury)   HPI:  68 y.o. admitted with bradycardia, fatigue and lightheadedness. 05/23/24 had CABG for severe 3 vessel dx LIMA to LAD, SVG OM1, SVG OM2.  RCA had ostial occlusion with collaterals to RV branch. EF was normal. Post op had some PAF and put on lopressor and amiodarone. He is a lifelong gym rat. Indicates his HR has always been in the 50's. Still working and golfs 3-4 times / week prior to surgery. In ER noted HR 46 No AV block. No long pauses Lopressor was 25 mg bid and amiodarone was 200 mg bid for 10 days then 200 mg daily. His sternal wound is well healed No signs of infection Hct stable 36.8 Troponin negative Procalcitonin negative Lactic acid normal.   ROS All other systems reviewed and negative except as noted above  Past Medical History:  Diagnosis Date   Anxiety and depression    Diabetes mellitus without complication (HCC)    GERD (gastroesophageal reflux disease)    Hyperlipidemia    Melanoma (HCC) 2023   back    Family History  Problem Relation Age of Onset   Diabetes Mother    Hypertension Mother    Cancer Sister        uterine cancer    Social History   Socioeconomic History   Marital status: Married    Spouse name: Not on file   Number of children: Not on file   Years of education: Not on file   Highest education level: Associate degree: academic program  Occupational History   Not on file  Tobacco Use   Smoking status: Some Days    Types: Cigarettes, Cigars    Start date: 2010   Smokeless tobacco: Never  Vaping Use   Vaping status: Never Used  Substance and Sexual Activity   Alcohol use: Yes    Alcohol/week: 1.0 - 2.0 standard  drink of alcohol    Types: 1 - 2 Cans of beer per week   Drug use: No   Sexual activity: Not on file  Other Topics Concern   Not on file  Social History Narrative   Senior buyer for manufacturing    Married   One children    One grandchild      He likes to play golf    Social Drivers of Health   Financial Resource Strain: Low Risk  (08/10/2023)   Overall Financial Resource Strain (CARDIA)    Difficulty of Paying Living Expenses: Not hard at all  Food Insecurity: No Food Insecurity (05/18/2024)   Hunger Vital Sign    Worried About Running Out of Food in the Last Year: Never true    Ran Out of Food in the Last Year: Never true  Transportation Needs: No Transportation Needs (05/18/2024)   PRAPARE - Administrator, Civil Service (Medical): No    Lack of Transportation (Non-Medical): No  Physical Activity: Insufficiently Active (08/10/2023)   Exercise Vital Sign    Days of Exercise per Week: 5 days    Minutes of Exercise per Session: 20 min  Stress: No Stress Concern Present (08/10/2023)   [employer] of Occupational Health - Occupational Stress Questionnaire    Feeling of Stress : Only a  little  Social Connections: Socially Integrated (05/18/2024)   Social Connection and Isolation Panel    Frequency of Communication with Friends and Family: Twice a week    Frequency of Social Gatherings with Friends and Family: Once a week    Attends Religious Services: More than 4 times per year    Active Member of [employer] or Organizations: Yes    Attends Engineer, Structural: More than 4 times per year    Marital Status: Married  Catering Manager Violence: Not At Risk (05/18/2024)   Humiliation, Afraid, Rape, and Kick questionnaire    Fear of Current or Ex-Partner: No    Emotionally Abused: No    Physically Abused: No    Sexually Abused: No    Past Surgical History:  Procedure Laterality Date   CORONARY ARTERY BYPASS GRAFT N/A 05/23/2024   Procedure: CORONARY ARTERY  BYPASS GRAFTING (CABG) X THREE, USING LEFT INTERNAL MAMMARY ARTERY AND RIGHT LEG GREATER SAPHENOUS VEIN HARVESTED ENDOSCOPICALLY;  Surgeon: Shyrl Linnie KIDD, MD;  Location: [facility] OR;  Service: Open Heart Surgery;  Laterality: N/A;   RIGHT/LEFT HEART CATH AND CORONARY ANGIOGRAPHY N/A 05/19/2024   Procedure: RIGHT/LEFT HEART CATH AND CORONARY ANGIOGRAPHY;  Surgeon: Jordan, Namita Yearwood M, MD;  Location: [facility] INVASIVE CV LAB;  Service: Cardiovascular;  Laterality: N/A;   ROTATOR CUFF REPAIR     SHOULDER SURGERY  2010   B/L      Current Facility-Administered Medications:    aspirin chewable tablet 81 mg, 81 mg, Oral, Daily, Georgina Basket, MD, 81 mg at 06/07/24 0945   lactated ringers  infusion, , Intravenous, Continuous, Plunkett, Whitney, MD, Last Rate: 125 mL/hr at 06/07/24 0329, New Bag at 06/07/24 0329   rosuvastatin  (CRESTOR ) tablet 40 mg, 40 mg, Oral, Daily, Georgina Basket, MD, 40 mg at 06/07/24 0945   traZODone  (DESYREL ) tablet 100 mg, 100 mg, Oral, QHS, Georgina Basket, MD  aspirin  81 mg Oral Daily   rosuvastatin   40 mg Oral Daily   traZODone   100 mg Oral QHS    lactated ringers  125 mL/hr at 06/07/24 0329    Physical Exam: Blood pressure 127/75, pulse (!) 59, temperature 97.7 F (36.5 C), temperature source Oral, resp. rate 14, height 5' 8 (1.727 m), weight 80.1 kg, SpO2 100%.    Well appearing white male Lungs clear Left carotid bruit Sternum healing well Abdomen benign No edema Right endoscopic harvest site  Labs:   Lab Results  Component Value Date   WBC 11.2 (H) 06/06/2024   HGB 12.0 (L) 06/06/2024   HCT 36.8 (L) 06/06/2024   MCV 98.1 06/06/2024   PLT 495 (H) 06/06/2024    Recent Labs  Lab 06/06/24 1553 06/06/24 1853  NA 138  --   K 4.8  --   CL 102  --   CO2 25  --   BUN 35*  --   CREATININE 1.75*  --   CALCIUM  9.5  --   PROT  --  7.4  BILITOT  --  0.4  ALKPHOS  --  56  ALT  --  16  AST  --  23  GLUCOSE 123*  --    No results found for: CKTOTAL, CKMB,  CKMBINDEX, TROPONINI  Lab Results  Component Value Date   CHOL 142 05/05/2024   CHOL 205 (H) 12/02/2023   CHOL 198 05/05/2023   Lab Results  Component Value Date   HDL 52.50 05/05/2024   HDL 61 12/02/2023   HDL 62.90 05/05/2023   Lab Results  Component Value Date   LDLCALC 71 05/05/2024   LDLCALC 119 (H) 12/02/2023   LDLCALC 117 (H) 05/05/2023   Lab Results  Component Value Date   TRIG 93.0 05/05/2024   TRIG 142 12/02/2023   TRIG 86.0 05/05/2023   Lab Results  Component Value Date   CHOLHDL 3 05/05/2024   CHOLHDL 3.4 12/02/2023   CHOLHDL 3 05/05/2023   Lab Results  Component Value Date   LDLDIRECT 156.0 04/08/2021   LDLDIRECT 167.0 09/30/2018   LDLDIRECT 97.0 02/18/2015      Radiology: DG Chest 2 View Result Date: 06/06/2024 EXAM: 2 VIEW(S) XRAY OF THE CHEST 06/06/2024 04:30:10 PM COMPARISON: Chest x-ray 05/25/2024. CLINICAL HISTORY: hypotension hypotension FINDINGS: LUNGS AND PLEURA: No focal pulmonary opacity. No pulmonary edema. There is scarring in the left lung base. There is a small left pleural effusion. No pneumothorax. HEART AND MEDIASTINUM: Surrounding wires are again seen. The heart is mildly enlarged. BONES AND SOFT TISSUES: No acute osseous abnormality. IMPRESSION: 1. Small left pleural effusion. 2. Scarring in the left lung base. 3. Mildly enlarged heart. Electronically signed by: Greig Pique MD 06/06/2024 05:04 PM EST RP Workstation: [id number]   CT CORONARY MORPH W/CTA COR W/SCORE W/CA W/CM &/OR WO/CM Addendum Date: 05/26/2024 ADDENDUM REPORT: 05/26/2024 15:18 EXAM: OVER-READ INTERPRETATION  CT CHEST The following report is an over-read performed by radiologist Dr. Andrea Gasman of [facility] on 05/26/2024. This over-read does not include interpretation of cardiac or coronary anatomy or pathology. The coronary CTA interpretation by the cardiologist is attached. COMPARISON:  None. FINDINGS: Vascular: Aortic atherosclerosis. The included  aorta is normal in caliber. Mediastinum/nodes: No adenopathy or mass. Unremarkable esophagus. Lungs: No focal airspace disease. Small perifissural lymph nodes in the right lower lobe, need no further imaging follow-up. No pleural fluid. The included airways are patent. Upper abdomen: No acute or unexpected findings. Musculoskeletal: There are no acute or suspicious osseous abnormalities. Incidental bone island within midthoracic vertebra. IMPRESSION: Aortic Atherosclerosis (ICD10-I70.0). Electronically Signed   By: Andrea Gasman M.D.   On: 05/26/2024 15:18   Result Date: 05/26/2024 CLINICAL DATA:  11M with hypertension, hyperlipidemia, diabetes and chest pain. EXAM: Cardiac/Coronary  CT TECHNIQUE: The patient was scanned on a [employer]. PROTOCOL: A 120 kV prospective scan was triggered in the descending thoracic aorta at 111 HU's. Axial non-contrast 3 mm slices were carried out through the heart. The data set was analyzed on a dedicated work station and scored using the Agatson method. Gantry rotation speed was 250 msecs and collimation was .6 mm. No beta blockade and 0.8 mg of sl NTG was given. The 3D data set was reconstructed in 5% intervals of the 67-82 % of the R-R cycle. Diastolic phases were analyzed on a dedicated work station using MPR, MIP and VRT modes. The patient received 80 cc of contrast. FINDINGS: Aorta: Normal size.  Aortic atherosclerosis.   No dissection. Aortic Valve:  Trileaflet.  No calcifications. Coronary Arteries:  Normal coronary origin.  Right dominance. RCA is a small non-dominant artery that gives rise to PDA and PLVB. There is 100% occlusion at the ostium and severe (>70%) mixed plaque in the proximal RCA. Left main is a large artery that gives rise to LAD and LCX arteries. There is minimal (<25%) calcified plaque. LAD is a large vessel that has extensive mixed plaque. There is moderate (50-69%) mixed plaque in the ostium and severe (>70%) mixed plaque in the mid and  distal LAD. D1 and D2 are small vessels  that are heavily diseased. LCX is a dominant artery with moderate (50-69%) mixed plaque in the proximal and distal vessel. There is severe (>70%) stenosis in L-PDA. There is severe (>70%) stenosis in OM1. There is severe (>70%) stenosis at the ostium of OM2. Coronary Calcium  Score: Left main: 112 Left anterior descending artery: 1007 Left circumflex artery: 747 Right coronary artery: 77.9 Total: 1944 Percentile: 95th Other findings: Normal pulmonary vein drainage into the left atrium. Normal let atrial appendage without a thrombus. Normal size of the pulmonary artery. Non-cardiac: See separate report from [facility]. IMPRESSION: 1. Coronary calcium  score of 1944. This was 95th percentile for age-, sex, and race-matched controls. 2. Normal coronary origin with left dominance. 3. There is 100% occlusion at the ostium and severe (>70%) mixed plaque in the proximal RCA. There is severe (>70%) stenosis in the LAD, OM1, OM2 and L-PDA. There is moderate (50-69%) stenosis in the proximal and distal LAD. CAD-RADS 5. 4.  Willl send for FFRct. 5.  Recommend cardiac catheterization. RECOMMENDATIONS: CAD-RADS 5: Total coronary occlusion (100%). Consider cardiac catheterization or viability assessment. Consider symptom-guided anti-ischemic pharmacotherapy as well as risk factor modification per guideline directed care. Annabella Scarce, MD Electronically Signed: By: Annabella Scarce M.D. On: 05/19/2024 12:42   DG Chest Port 1 View Result Date: 05/25/2024 CLINICAL DATA:  [id number] Pneumothorax [id number] EXAM: PORTABLE CHEST - 1 VIEW COMPARISON:  05/24/2024 FINDINGS: Right IJ approach introducer sheath with a pulmonary artery catheter in place, terminating in the lower SVC. Similar low lung volumes with small left pleural effusion and streaky left basilar atelectasis. Trace right pleural effusion. No pneumothorax. Left-sided thoracostomy tube is similarly positioned. Mediastinal  drain also noted. Mild cardiomegaly. Sternotomy wires and CABG markers. IMPRESSION: 1. No significant interval change to the lungs. 2. Similarly positioned support tubes and lines, as delineated above. Electronically Signed   By: Rogelia Myers M.D.   On: 05/25/2024 09:13   DG Chest Port 1 View Result Date: 05/24/2024 EXAM: 1 VIEW(S) XRAY OF THE CHEST 05/24/2024 05:22:00 AM COMPARISON: 05/23/2024 CLINICAL HISTORY: S/P CABG x 3 [id number]. Reason for exam: S/P CABG x 3 FINDINGS: LINES, TUBES AND DEVICES: Right IJ central venous catheter with distal tip in mid SVC. Mediastinal drain and left chest tube in place. . LUNGS AND PLEURA: Low lung volumes. Increased bilateral pleural effusions, left greater than right. Persistent bibasilar atelectasis. No pulmonary edema. No pneumothorax. HEART AND MEDIASTINUM: Post-CABG changes. No acute abnormality of the cardiac and mediastinal silhouettes. BONES AND SOFT TISSUES: Intact sternotomy wires. No acute osseous abnormality. IMPRESSION: 1. Increased bilateral pleural effusions, left greater than right, with worsened bibasilar atelectasis. 2. No pneumothorax. Electronically signed by: Waddell Calk MD 05/24/2024 06:58 AM EDT RP Workstation: [id number]   DG Chest Port 1 View Result Date: 05/23/2024 CLINICAL DATA:  [id number] S/P CABG x 3 [id number] EXAM: PORTABLE CHEST - 1 VIEW COMPARISON:  05/17/2024 FINDINGS: Endotracheal tube terminates in the mid trachea. Esophagogastric tube terminates in the stomach. Right IJ approach pulmonary artery catheter terminates in the mid SVC with an introducer sheath noted. Mediastinal and left-sided thoracostomy tubes in place. Low lung volumes. Streaky left basilar atelectasis with trace left pleural effusion. No pneumothorax. Mild cardiomegaly. Sternotomy wires and CABG markers. IMPRESSION: 1. Low lung volumes. Small left pleural effusion with left basilar atelectasis. 2. Well positioned support tubes and lines, as delineated above. Electronically  Signed   By: Rogelia Myers M.D.   On: 05/23/2024 14:40   ECHO INTRAOPERATIVE TEE Result Date: 05/23/2024  *INTRAOPERATIVE  TRANSESOPHAGEAL REPORT *  Patient Name:   Loys P [facility] Date of Exam: 05/23/2024 Medical Rec #:  [id number]       Height:       68.0 in Accession #:    [id number]      Weight:       177.0 lb Date of Birth:  May 30, 1956       BSA:          1.94 m Patient Age:    68 years        BP:           118/86 mmHg Patient Gender: M               HR:           62 bpm. Exam Location:  Anesthesiology Transesophogeal exam was perform intraoperatively during surgical procedure. Patient was closely monitored under general anesthesia during the entirety of examination. Indications:     I25.110 Atherosclerotic heart disease of native coronary artery                  with unstable angina pectoris Performing Phys: Debby Like MD Diagnosing Phys: Debby Like MD Complications: No known complications during this procedure. POST-OP IMPRESSIONS _ Left Ventricle: The left ventricle is unchanged from pre-bypass. _ Right Ventricle: The right ventricle appears unchanged from pre-bypass. _ Aorta: The aorta appears unchanged from pre-bypass. _ Left Atrium: The left atrium appears unchanged from pre-bypass. _ Left Atrial Appendage: The left atrial appendage appears unchanged from pre-bypass. _ Aortic Valve: The aortic valve appears unchanged from pre-bypass. _ Mitral Valve: The mitral valve appears unchanged from pre-bypass. _ Tricuspid Valve: The tricuspid valve appears unchanged from pre-bypass. _ Pulmonic Valve: The pulmonic valve appears unchanged from pre-bypass. _ Interatrial Septum: The interatrial septum appears unchanged from pre-bypass. _ Interventricular Septum: The interventricular septum appears unchanged from pre-bypass. _ Pericardium: The pericardium appears unchanged from pre-bypass. PRE-OP FINDINGS  Left Ventricle: The left ventricle has normal systolic function, with an ejection fraction of 60-65%. The  cavity size was normal. No evidence of left ventricular regional wall motion abnormalities. There is no left ventricular hypertrophy. Right Ventricle: The right ventricle has normal systolic function. The cavity was normal. There is no increase in right ventricular wall thickness. Left Atrium: Left atrial size was dilated. Right Atrium: Right atrial size was normal in size. Interatrial Septum: No atrial level shunt detected by color flow Doppler. Pericardium: There is no evidence of pericardial effusion. Mitral Valve: The mitral valve is normal in structure. Mitral valve regurgitation is mild by color flow Doppler. There is No evidence of mitral stenosis. Tricuspid Valve: The tricuspid valve was normal in structure. Tricuspid valve regurgitation is trivial by color flow Doppler. Aortic Valve: The aortic valve is tricuspid Aortic valve regurgitation was not visualized by color flow Doppler. There is no stenosis of the aortic valve. Pulmonic Valve: The pulmonic valve was not assessed. Pulmonic valve regurgitation was not assessed by color flow Doppler. Aorta: The aortic root, ascending aorta and aortic arch are normal in size and structure.  Debby Like MD Electronically signed by Debby Like MD Signature Date/Time: 05/23/2024/2:31:54 PM    Final    VAS US  DOPPLER PRE CABG Result Date: 05/22/2024 PREOPERATIVE VASCULAR EVALUATION Patient Name:  Kadeem P [facility]  Date of Exam:   05/22/2024 Medical Rec #: [id number]        Accession #:    [id number] Date of Birth: 06-07-1956  Patient Gender: M Patient Age:   15 years Exam Location:  [facility] Procedure:      VAS US  DOPPLER PRE CABG Referring Phys: WAYNE GOLD --------------------------------------------------------------------------------  Indications:      Pre-CABG. Risk Factors:     Hypertension, hyperlipidemia, Diabetes, current smoker                   (occasional cigar), coronary artery disease. Limitations:      Mechanical interference with ABI  Doppler signal Comparison Study: No prior study on file Performing Technologist: Rachel Pellet RVS  Examination Guidelines: A complete evaluation includes B-mode imaging, spectral Doppler, color Doppler, and power Doppler as needed of all accessible portions of each vessel. Bilateral testing is considered an integral part of a complete examination. Limited examinations for reoccurring indications may be performed as noted.  Right Carotid Findings: +----------+--------+--------+--------+------------+------------------+           PSV cm/sEDV cm/sStenosisDescribe    Comments           +----------+--------+--------+--------+------------+------------------+ CCA Prox  81      18                          intimal thickening +----------+--------+--------+--------+------------+------------------+ CCA Distal93      21                          intimal thickening +----------+--------+--------+--------+------------+------------------+ ICA Prox  95      21              heterogenous                   +----------+--------+--------+--------+------------+------------------+ ICA Mid   122     33                                             +----------+--------+--------+--------+------------+------------------+ ICA Distal79      25                                             +----------+--------+--------+--------+------------+------------------+ ECA       134     22                                             +----------+--------+--------+--------+------------+------------------+ +----------+--------+-------+--------+------------+           PSV cm/sEDV cmsDescribeArm Pressure +----------+--------+-------+--------+------------+ [id number]                    113          +----------+--------+-------+--------+------------+ +---------+--------+--------+----------+ VertebralPSV cm/sEDV cm/sRetrograde +---------+--------+--------+----------+ Left Carotid Findings:  +----------+--------+--------+--------+------------+------------------+           PSV cm/sEDV cm/sStenosisDescribe    Comments           +----------+--------+--------+--------+------------+------------------+ CCA Prox  88      16                          intimal thickening +----------+--------+--------+--------+------------+------------------+ CCA Distal124     21  heterogenous                   +----------+--------+--------+--------+------------+------------------+ ICA Prox  424     169     80-99%  calcific                       +----------+--------+--------+--------+------------+------------------+ ICA Mid   167     47                                             +----------+--------+--------+--------+------------+------------------+ ICA Distal62      18                                             +----------+--------+--------+--------+------------+------------------+ ECA       83      12                                             +----------+--------+--------+--------+------------+------------------+  +----------+--------+--------+--------+------------+ SubclavianPSV cm/sEDV cm/sDescribeArm Pressure +----------+--------+--------+--------+------------+           70      56              128          +----------+--------+--------+--------+------------+ +---------+--------+--+--------+--+ VertebralPSV cm/s47EDV cm/s16 +---------+--------+--+--------+--+  ABI Findings: +------------------+-----+--------+--------------------------------------------+ Rt Pressure (mmHg)IndexWaveformComment                                      +------------------+-----+--------+--------------------------------------------+ 113                                                                         +------------------+-----+--------+--------------------------------------------+                        biphasicunable to get pressure secondary to                                          technical difficulties                       +------------------+-----+--------+--------------------------------------------+ 254               1.98                                                      +------------------+-----+--------+--------------------------------------------+ 73                0.57 Abnormal                                             +------------------+-----+--------+--------------------------------------------+ +------------------+-----+----------+  Lt Pressure (mmHg)IndexWaveform   +------------------+-----+----------+ 128                               +------------------+-----+----------+                        absent     +------------------+-----+----------+ 164               1.28 monophasic +------------------+-----+----------+ 81                0.63 Abnormal   +------------------+-----+----------+ +-------+---------------------+ ABI/TBIToday's ABI/TBI       +-------+---------------------+ Right  Non compressible/0.57 +-------+---------------------+ Left   non compressible/0.63 +-------+---------------------+  Right Doppler Findings: +--------+--------+--------------------------------+ Site    PressureComments                         +--------+--------+--------------------------------+ [id number]                                      +--------+--------+--------------------------------+ Radial          Incidental finding of AV Fistula +--------+--------+--------------------------------+  Left Doppler Findings: +--------+--------+ Site    Pressure +--------+--------+ [id number]      +--------+--------+  Technologist Notes: Mechanical interference, unable to get accurate waveforms. Mechanical interference, unable to get accurate waveforms.  Summary: Right Carotid: Velocities in the right ICA are consistent with a 1-39% stenosis. Left Carotid: Velocities in the left ICA are consistent with a  80-99% stenosis. Vertebrals: Left vertebral artery demonstrates antegrade flow. Right vertebral             artery demonstrates retrograde flow. Right ABI: Resting right ankle-brachial index indicates noncompressible right lower extremity arteries. The right toe-brachial index is abnormal. Left ABI: Resting left ankle-brachial index indicates noncompressible left lower extremity arteries. The left toe-brachial index is abnormal. Right Upper Extremity: Doppler waveforms remain within normal limits with right radial compression. Doppler waveforms remain within normal limits with right ulnar compression. Incidental finding of AV fistula at radial cath site. Left Upper Extremity: Doppler waveforms remain within normal limits with left radial compression. Doppler waveforms remain within normal limits with left ulnar compression.  Suggest Peripheral Vascular Consult. Electronically signed by Debby Robertson on 05/22/2024 at 4:52:59 PM.    Final    CARDIAC CATHETERIZATION Result Date: 05/19/2024   Ost RCA to Prox RCA lesion is 100% stenosed.   Ost Cx to Prox Cx lesion is 90% stenosed.   1st Mrg lesion is 90% stenosed.   3rd Mrg lesion is 100% stenosed.   LPAV lesion is 100% stenosed with 100% stenosed side branch in LPDA.   Ost LAD lesion is 70% stenosed.   Prox LAD lesion is 90% stenosed.   Mid LAD lesion is 95% stenosed.   Mid LAD to Dist LAD lesion is 85% stenosed.   The left ventricular systolic function is normal.   LV end diastolic pressure is normal.   The left ventricular ejection fraction is 55-65% by visual estimate. Critical multivessel obstructive CAD Normal LV function Normal LV filling pressures. LVEDP 9 mm Hg, PCWP 11/9, mean 11 mm Hg Normal right heart pressures. PAP 30/10, mean 17 mm Hg Normal cardiac output. 5.31 L/min, index 2.74 Plan: CT surgery consult for CABG. Not suitable for PCI   CT CORONARY FRACTIONAL FLOW RESERVE FLUID ANALYSIS Result Date: 05/19/2024  EXAM: FFRCT ANALYSIS for abnormal  coronary CT-A. FINDINGS: FFRct analysis was performed on the original cardiac CT angiogram dataset. Diagrammatic representation of the FFRct analysis is provided in a separate PDF document in PACS. This dictation was created using the PDF document and an interactive 3D model of the results. 3D model is not available in the EMR/PACS. Normal FFR range is >0.80. 1. Left Main: Very short.  FFRct 1.0. 2. LAD: FFRct 0.87 ostial, 0.81 proximal, 0.72 mid. Distal LAD is not modeled. 3. LCX: FFRct 0.99 ostial. 0.8 proximal, 0.77 mid, 0.71 distal. L-PDA modeled as occluded. OM1 and OM2 are not modeled. OM3 FFRct 0.78 proximal, 0.57 distal. 4.       RCA: Occlusion at the ostium IMPRESSION: 1. FFRct findings are consistent with 100% occlusion at the ostium of the RCA and at the L-PDA. 2. Findings are concerning for severe stenosis in the mid LAD, distal LCX and OM3. 3.  Recommend cardiac catheterization. 4. LM is very short. Recommend care in engaging the LM, as there is almost dual ostia for the LAD and CX and concern for significant ostial disease in both vessels. Tiffany C. Raford, MD Electronically Signed   By: Annabella Raford M.D.   On: 05/19/2024 12:51   ECHOCARDIOGRAM COMPLETE Result Date: 05/19/2024    ECHOCARDIOGRAM REPORT   Patient Name:   Niclas P [facility] Date of Exam: 05/19/2024 Medical Rec #:  [id number]       Height:       68.0 in Accession #:    [id number]      Weight:       177.0 lb Date of Birth:  Dec 17, 1955       BSA:          1.940 m Patient Age:    68 years        BP:           117/79 mmHg Patient Gender: M               HR:           50 bpm. Exam Location:  Inpatient Procedure: 2D Echo, Cardiac Doppler and Color Doppler (Both Spectral and Color            Flow Doppler were utilized during procedure). Indications:    Chest Pain R07.9  History:        Patient has no prior history of Echocardiogram examinations.                 Previous Myocardial Infarction; Risk Factors:Hypertension,                  Diabetes and Dyslipidemia.  Sonographer:    Tinnie Gosling RDCS Referring Phys: [phone] RONDELL A SMITH IMPRESSIONS  1. Left ventricular ejection fraction, by estimation, is 60 to 65%. The left ventricle has normal function. The left ventricle has no regional wall motion abnormalities. Left ventricular diastolic parameters were normal.  2. Right ventricular systolic function is normal. The right ventricular size is normal. Tricuspid regurgitation signal is inadequate for assessing PA pressure.  3. Left atrial size was mildly dilated.  4. The mitral valve is normal in structure. No evidence of mitral valve regurgitation. No evidence of mitral stenosis.  5. The aortic valve is normal in structure. Aortic valve regurgitation is not visualized. No aortic stenosis is present.  6. The inferior vena cava is normal in size with greater than 50% respiratory variability, suggesting right atrial pressure of 3 mmHg.  7.  Cannot exclude a small PFO. Comparison(s): No prior Echocardiogram. FINDINGS  Left Ventricle: Left ventricular ejection fraction, by estimation, is 60 to 65%. The left ventricle has normal function. The left ventricle has no regional wall motion abnormalities. The left ventricular internal cavity size was normal in size. There is  no left ventricular hypertrophy. Left ventricular diastolic parameters were normal. Right Ventricle: The right ventricular size is normal. No increase in right ventricular wall thickness. Right ventricular systolic function is normal. Tricuspid regurgitation signal is inadequate for assessing PA pressure. Left Atrium: Left atrial size was mildly dilated. Right Atrium: Right atrial size was normal in size. Pericardium: There is no evidence of pericardial effusion. Presence of epicardial fat layer. Mitral Valve: The mitral valve is normal in structure. No evidence of mitral valve regurgitation. No evidence of mitral valve stenosis. Tricuspid Valve: The tricuspid valve is normal in  structure. Tricuspid valve regurgitation is trivial. No evidence of tricuspid stenosis. Aortic Valve: The aortic valve is normal in structure. Aortic valve regurgitation is not visualized. No aortic stenosis is present. Pulmonic Valve: The pulmonic valve was normal in structure. Pulmonic valve regurgitation is not visualized. No evidence of pulmonic stenosis. Aorta: The aortic root and ascending aorta are structurally normal, with no evidence of dilitation. Venous: The inferior vena cava is normal in size with greater than 50% respiratory variability, suggesting right atrial pressure of 3 mmHg. IAS/Shunts: Cannot exclude a small PFO.  LEFT VENTRICLE PLAX 2D LVIDd:         4.40 cm   Diastology LVIDs:         2.60 cm   LV e' medial:    7.29 cm/s LV PW:         1.00 cm   LV E/e' medial:  10.5 LV IVS:        1.00 cm   LV e' lateral:   7.94 cm/s LVOT diam:     2.00 cm   LV E/e' lateral: 9.7 LV SV:         71 LV SV Index:   37 LVOT Area:     3.14 cm  RIGHT VENTRICLE             IVC RV S prime:     10.40 cm/s  IVC diam: 1.60 cm TAPSE (M-mode): 2.2 cm LEFT ATRIUM             Index        RIGHT ATRIUM           Index LA diam:        3.50 cm 1.80 cm/m   RA Area:     12.20 cm LA Vol (A2C):   56.3 ml 29.02 ml/m  RA Volume:   26.10 ml  13.45 ml/m LA Vol (A4C):   44.5 ml 22.93 ml/m LA Biplane Vol: 51.2 ml 26.39 ml/m  AORTIC VALVE LVOT Vmax:   96.00 cm/s LVOT Vmean:  66.500 cm/s LVOT VTI:    0.227 m  AORTA Ao Root diam: 2.90 cm Ao Asc diam:  3.30 cm MITRAL VALVE MV Area (PHT): 3.01 cm    SHUNTS MV Decel Time: 252 msec    Systemic VTI:  0.23 m MV E velocity: 76.70 cm/s  Systemic Diam: 2.00 cm MV A velocity: 83.60 cm/s MV E/A ratio:  0.92 Emeline Calender Electronically signed by Emeline Calender Signature Date/Time: 05/19/2024/11:46:14 AM    Final    DG Chest 2 View Result Date: 05/17/2024 EXAM: 2 VIEW(S) XRAY OF THE CHEST  05/17/2024 10:56:16 PM COMPARISON: None available. CLINICAL HISTORY: cp. 68 y/o male reports CP, dizziness  and SHOB x several weeks. FINDINGS: LUNGS AND PLEURA: No focal pulmonary opacity. No pulmonary edema. No pleural effusion. No pneumothorax. HEART AND MEDIASTINUM: Atherosclerotic plaque. BONES AND SOFT TISSUES: No acute osseous abnormality. IMPRESSION: 1. No acute cardiopulmonary process detected. Electronically signed by: Morgane Naveau MD 05/17/2024 10:57 PM EDT RP Workstation: [id number]    EKG: SB rate 46 no AV block   ASSESSMENT AND PLAN:   Bradycardia:  Chronically low HR. Started on lopressor and amiodarone post op for PAF. In NSR Holding lopressor and amiodarone. With activity in halls HR up to 68 bpm. I doubt he will need PPM. Would d/c possibly in 24-48 hours off lopressor and amiodarone with 14 day Zio monitor  TSH was normal 05/18/24 at 1.6  CAD/CABG:  LIMA to LAD, SVG OM1, SVG OM2 normal EF sternum healing well  HLD continue statin DM:  continue glucophage  A1c 6.1   Signed: Maude Emmer 06/07/2024, 10:00 AM

## 2024-06-07 NOTE — Final Progress Note (Signed)
 Dr. Franky was made aware of patient arrival .

## 2024-06-07 NOTE — ED Notes (Signed)
 Pt left via [facility] transport

## 2024-06-07 NOTE — Progress Notes (Deleted)
   Subjective:    Patient ID: Matthew Patterson, male    DOB: 1956/01/01, 68 y.o.   MRN: [id number]  HPI    Review of Systems     Objective:   Physical Exam        Assessment & Plan:

## 2024-06-08 ENCOUNTER — Ambulatory Visit

## 2024-06-08 ENCOUNTER — Ambulatory Visit: Admitting: Internal Medicine

## 2024-06-08 DIAGNOSIS — Z951 Presence of aortocoronary bypass graft: Secondary | ICD-10-CM

## 2024-06-08 DIAGNOSIS — I480 Paroxysmal atrial fibrillation: Secondary | ICD-10-CM

## 2024-06-08 DIAGNOSIS — R001 Bradycardia, unspecified: Secondary | ICD-10-CM | POA: Diagnosis not present

## 2024-06-08 DIAGNOSIS — N179 Acute kidney failure, unspecified: Secondary | ICD-10-CM | POA: Diagnosis not present

## 2024-06-08 DIAGNOSIS — E119 Type 2 diabetes mellitus without complications: Secondary | ICD-10-CM

## 2024-06-08 DIAGNOSIS — Z8249 Family history of ischemic heart disease and other diseases of the circulatory system: Secondary | ICD-10-CM

## 2024-06-08 LAB — BASIC METABOLIC PANEL WITH GFR
Anion gap: 10 (ref 5–15)
BUN: 22 mg/dL (ref 8–23)
CO2: 22 mmol/L (ref 22–32)
Calcium: 8.5 mg/dL — ABNORMAL LOW (ref 8.9–10.3)
Chloride: 107 mmol/L (ref 98–111)
Creatinine, Ser: 1.33 mg/dL — ABNORMAL HIGH (ref 0.61–1.24)
GFR, Estimated: 58 mL/min — ABNORMAL LOW (ref >60)
Glucose, Bld: 105 mg/dL — ABNORMAL HIGH (ref 70–99)
Potassium: 3.9 mmol/L (ref 3.5–5.1)
Sodium: 139 mmol/L (ref 135–145)

## 2024-06-08 NOTE — Care Management Obs Status (Signed)
 MEDICARE OBSERVATION STATUS NOTIFICATION   Patient Details  Name: Matthew Patterson MRN: [id number] Date of Birth: 04/13/1956   Medicare Observation Status Notification Given:  No    [name] 06/08/2024, 9:09 AM

## 2024-06-08 NOTE — Discharge Summary (Signed)
 Physician Discharge Summary   Patient: Matthew Patterson MRN: [id number] DOB: 02/04/1956  Admit date:     06/06/2024  Discharge date: 06/08/24  Discharge Physician: Lonni SHAUNNA Dalton   PCP: Merna Huxley, NP     Recommendations at discharge:  Follow-up with cardiology in 5 days for bradycardia on metoprolol and amiodarone     Discharge Diagnoses: Principal Problem:   Hypotension and bradycardia due to medication effect Other problems:   Dehydration   Coronary artery disease   Diabetes   History of melanoma   Hyperlipidemia     Hospital Course: 68 y.o. M with recent 2V CABG by Dr. Shyrl, complicated by post-op Afib, discharged 10 days ago on new metoprolol and amiodarone developed orthostatic symptoms.  Wife checked BP and HR and SBP 80s, HR 40s, so he came to the ER.  Cardiology were consulted in the ER, he was given IV fluids, metoprolol and amiodarone were held.  Overnight, his bradycardia resolved, his blood pressure normalized, his transiently increased creatinine improved, and he was able to ambulate with heart rate in the 80s.  Cardiology recommended close follow-up in the office, no Zio patch needed.  Metoprolol and amiodarone were held at discharge             The [place]  Controlled Substances Registry was reviewed for this patient prior to discharge.  Consultants: Cardiology Procedures performed: None Disposition: Home Diet recommendation:  Discharge Diet Orders (From admission, onward)     Start     Ordered   06/08/24 0000  Diet - low sodium heart healthy        06/08/24 0956             DISCHARGE MEDICATION: Allergies as of 06/08/2024   No Known Allergies      Medication List     STOP taking these medications    amiodarone 200 MG tablet Commonly known as: PACERONE   metoprolol tartrate 25 MG tablet Commonly known as: LOPRESSOR       TAKE these medications    acetaminophen  325 MG tablet Commonly known as:  TYLENOL  Take 2 tablets (650 mg total) by mouth every 6 (six) hours as needed. What changed:  when to take this reasons to take this   aspirin EC 325 MG tablet Take 1 tablet (325 mg total) by mouth daily.   cephALEXin 500 MG capsule Commonly known as: KEFLEX Take 1 capsule (500 mg total) by mouth 2 (two) times daily for 7 days.   fenofibrate  145 MG tablet Commonly known as: TRICOR  TAKE 1 TABLET BY MOUTH DAILY   FreeStyle Libre 3 Plus Sensor Misc 1 Device by Other route every 14 (fourteen) days. Change sensor every 15 days.   glucose blood test strip Use as instructed   metFORMIN  750 MG 24 hr tablet Commonly known as: GLUCOPHAGE -XR Take 1 tablet (750 mg total) by mouth daily with breakfast.   onetouch ultrasoft lancets Use as instructed   rosuvastatin  40 MG tablet Commonly known as: CRESTOR  Take 1 tablet (40 mg total) by mouth daily. What changed: when to take this   traMADol  50 MG tablet Commonly known as: ULTRAM  Take 50 mg by mouth at bedtime as needed for moderate pain (pain score 4-6) or severe pain (pain score 7-10).   traZODone  100 MG tablet Commonly known as: DESYREL  TAKE 1 TABLET BY MOUTH AT BEDTIME               Discharge Care Instructions  (From admission, onward)  Start     Ordered   06/08/24 0000  Discharge wound care:       Comments: As previously recommended by cardiothoracic surgery   06/08/24 0956            Follow-up Information     Lightfoot, Linnie KIDD, MD Follow up.   Specialty: Cardiothoracic Surgery Contact information: [address] [place] [address] [phone]                 Discharge Instructions     Diet - low sodium heart healthy   Complete by: As directed    Discharge instructions   Complete by: As directed    **IMPORTANT DISCHARGE INSTRUCTIONS**   From Dr. Jonel: You were admitted with low blood pressure and low heart rate from amiodarone and metoprolol  These  medicines were held, you were given fluids and the blood pressure and heart rate improved.  STOP metoprolol and amiodarone  Keep your appointment with Cardiology Dr. Tilmon next Monday  Call Dr. Shyrl to make sure you reschedule the appointment you missed  If you have recurrence of dizziness weakness, check your blood pressure and your heart rate with your watch, record it and call both Dr. Shyrl and Dr. Tilmon   Discharge wound care:   Complete by: As directed    As previously recommended by cardiothoracic surgery   Increase activity slowly   Complete by: As directed        Discharge Exam: Filed Weights   06/06/24 1551 06/07/24 0259  Weight: 76.7 kg 80.1 kg    General: Pt is alert, awake, not in acute distress Cardiovascular: Slow, regular, nl S1-S2, no murmurs appreciated.   No LE edema.   Respiratory: Normal respiratory rate and rhythm.  CTAB without rales or wheezes. Abdominal: Abdomen soft and non-tender.  No distension or HSM.   Neuro/Psych: Strength symmetric in upper and lower extremities.  Judgment and insight appear normal.   Condition at discharge: good  The results of significant diagnostics from this hospitalization (including imaging, microbiology, ancillary and laboratory) are listed below for reference.   Imaging Studies: DG Chest 2 View Result Date: 06/06/2024 EXAM: 2 VIEW(S) XRAY OF THE CHEST 06/06/2024 04:30:10 PM COMPARISON: Chest x-ray 05/25/2024. CLINICAL HISTORY: hypotension hypotension FINDINGS: LUNGS AND PLEURA: No focal pulmonary opacity. No pulmonary edema. There is scarring in the left lung base. There is a small left pleural effusion. No pneumothorax. HEART AND MEDIASTINUM: Surrounding wires are again seen. The heart is mildly enlarged. BONES AND SOFT TISSUES: No acute osseous abnormality. IMPRESSION: 1. Small left pleural effusion. 2. Scarring in the left lung base. 3. Mildly enlarged heart. Electronically signed by: Greig Pique MD  06/06/2024 05:04 PM EST RP Workstation: [id number]   CT CORONARY MORPH W/CTA COR W/SCORE W/CA W/CM &/OR WO/CM Addendum Date: 05/26/2024 ADDENDUM REPORT: 05/26/2024 15:18 EXAM: OVER-READ INTERPRETATION  CT CHEST The following report is an over-read performed by radiologist Dr. Andrea Gasman of [facility] on 05/26/2024. This over-read does not include interpretation of cardiac or coronary anatomy or pathology. The coronary CTA interpretation by the cardiologist is attached. COMPARISON:  None. FINDINGS: Vascular: Aortic atherosclerosis. The included aorta is normal in caliber. Mediastinum/nodes: No adenopathy or mass. Unremarkable esophagus. Lungs: No focal airspace disease. Small perifissural lymph nodes in the right lower lobe, need no further imaging follow-up. No pleural fluid. The included airways are patent. Upper abdomen: No acute or unexpected findings. Musculoskeletal: There are no acute or suspicious osseous abnormalities. Incidental  bone island within midthoracic vertebra. IMPRESSION: Aortic Atherosclerosis (ICD10-I70.0). Electronically Signed   By: Andrea Gasman M.D.   On: 05/26/2024 15:18   Result Date: 05/26/2024 CLINICAL DATA:  42M with hypertension, hyperlipidemia, diabetes and chest pain. EXAM: Cardiac/Coronary  CT TECHNIQUE: The patient was scanned on a [employer]. PROTOCOL: A 120 kV prospective scan was triggered in the descending thoracic aorta at 111 HU's. Axial non-contrast 3 mm slices were carried out through the heart. The data set was analyzed on a dedicated work station and scored using the Agatson method. Gantry rotation speed was 250 msecs and collimation was .6 mm. No beta blockade and 0.8 mg of sl NTG was given. The 3D data set was reconstructed in 5% intervals of the 67-82 % of the R-R cycle. Diastolic phases were analyzed on a dedicated work station using MPR, MIP and VRT modes. The patient received 80 cc of contrast. FINDINGS: Aorta: Normal size.   Aortic atherosclerosis.   No dissection. Aortic Valve:  Trileaflet.  No calcifications. Coronary Arteries:  Normal coronary origin.  Right dominance. RCA is a small non-dominant artery that gives rise to PDA and PLVB. There is 100% occlusion at the ostium and severe (>70%) mixed plaque in the proximal RCA. Left main is a large artery that gives rise to LAD and LCX arteries. There is minimal (<25%) calcified plaque. LAD is a large vessel that has extensive mixed plaque. There is moderate (50-69%) mixed plaque in the ostium and severe (>70%) mixed plaque in the mid and distal LAD. D1 and D2 are small vessels that are heavily diseased. LCX is a dominant artery with moderate (50-69%) mixed plaque in the proximal and distal vessel. There is severe (>70%) stenosis in L-PDA. There is severe (>70%) stenosis in OM1. There is severe (>70%) stenosis at the ostium of OM2. Coronary Calcium  Score: Left main: 112 Left anterior descending artery: 1007 Left circumflex artery: 747 Right coronary artery: 77.9 Total: 1944 Percentile: 95th Other findings: Normal pulmonary vein drainage into the left atrium. Normal let atrial appendage without a thrombus. Normal size of the pulmonary artery. Non-cardiac: See separate report from [facility]. IMPRESSION: 1. Coronary calcium  score of 1944. This was 95th percentile for age-, sex, and race-matched controls. 2. Normal coronary origin with left dominance. 3. There is 100% occlusion at the ostium and severe (>70%) mixed plaque in the proximal RCA. There is severe (>70%) stenosis in the LAD, OM1, OM2 and L-PDA. There is moderate (50-69%) stenosis in the proximal and distal LAD. CAD-RADS 5. 4.  Willl send for FFRct. 5.  Recommend cardiac catheterization. RECOMMENDATIONS: CAD-RADS 5: Total coronary occlusion (100%). Consider cardiac catheterization or viability assessment. Consider symptom-guided anti-ischemic pharmacotherapy as well as risk factor modification per guideline directed  care. Annabella Scarce, MD Electronically Signed: By: Annabella Scarce M.D. On: 05/19/2024 12:42   DG Chest Port 1 View Result Date: 05/25/2024 CLINICAL DATA:  [id number] Pneumothorax [id number] EXAM: PORTABLE CHEST - 1 VIEW COMPARISON:  05/24/2024 FINDINGS: Right IJ approach introducer sheath with a pulmonary artery catheter in place, terminating in the lower SVC. Similar low lung volumes with small left pleural effusion and streaky left basilar atelectasis. Trace right pleural effusion. No pneumothorax. Left-sided thoracostomy tube is similarly positioned. Mediastinal drain also noted. Mild cardiomegaly. Sternotomy wires and CABG markers. IMPRESSION: 1. No significant interval change to the lungs. 2. Similarly positioned support tubes and lines, as delineated above. Electronically Signed   By: Rogelia Myers M.D.   On: 05/25/2024 09:13  DG Chest Port 1 View Result Date: 05/24/2024 EXAM: 1 VIEW(S) XRAY OF THE CHEST 05/24/2024 05:22:00 AM COMPARISON: 05/23/2024 CLINICAL HISTORY: S/P CABG x 3 [id number]. Reason for exam: S/P CABG x 3 FINDINGS: LINES, TUBES AND DEVICES: Right IJ central venous catheter with distal tip in mid SVC. Mediastinal drain and left chest tube in place. . LUNGS AND PLEURA: Low lung volumes. Increased bilateral pleural effusions, left greater than right. Persistent bibasilar atelectasis. No pulmonary edema. No pneumothorax. HEART AND MEDIASTINUM: Post-CABG changes. No acute abnormality of the cardiac and mediastinal silhouettes. BONES AND SOFT TISSUES: Intact sternotomy wires. No acute osseous abnormality. IMPRESSION: 1. Increased bilateral pleural effusions, left greater than right, with worsened bibasilar atelectasis. 2. No pneumothorax. Electronically signed by: Waddell Calk MD 05/24/2024 06:58 AM EDT RP Workstation: [id number]   DG Chest Port 1 View Result Date: 05/23/2024 CLINICAL DATA:  [id number] S/P CABG x 3 [id number] EXAM: PORTABLE CHEST - 1 VIEW COMPARISON:  05/17/2024 FINDINGS:  Endotracheal tube terminates in the mid trachea. Esophagogastric tube terminates in the stomach. Right IJ approach pulmonary artery catheter terminates in the mid SVC with an introducer sheath noted. Mediastinal and left-sided thoracostomy tubes in place. Low lung volumes. Streaky left basilar atelectasis with trace left pleural effusion. No pneumothorax. Mild cardiomegaly. Sternotomy wires and CABG markers. IMPRESSION: 1. Low lung volumes. Small left pleural effusion with left basilar atelectasis. 2. Well positioned support tubes and lines, as delineated above. Electronically Signed   By: Rogelia Myers M.D.   On: 05/23/2024 14:40   ECHO INTRAOPERATIVE TEE Result Date: 05/23/2024  *INTRAOPERATIVE TRANSESOPHAGEAL REPORT *  Patient Name:   [facility]imo P [facility] Date of Exam: 05/23/2024 Medical Rec #:  [id number]       Height:       68.0 in Accession #:    [id number]      Weight:       177.0 lb Date of Birth:  Dec 10, 1955       BSA:          1.94 m Patient Age:    68 years        BP:           118/86 mmHg Patient Gender: M               HR:           62 bpm. Exam Location:  Anesthesiology Transesophogeal exam was perform intraoperatively during surgical procedure. Patient was closely monitored under general anesthesia during the entirety of examination. Indications:     I25.110 Atherosclerotic heart disease of native coronary artery                  with unstable angina pectoris Performing Phys: Debby Like MD Diagnosing Phys: Debby Like MD Complications: No known complications during this procedure. POST-OP IMPRESSIONS _ Left Ventricle: The left ventricle is unchanged from pre-bypass. _ Right Ventricle: The right ventricle appears unchanged from pre-bypass. _ Aorta: The aorta appears unchanged from pre-bypass. _ Left Atrium: The left atrium appears unchanged from pre-bypass. _ Left Atrial Appendage: The left atrial appendage appears unchanged from pre-bypass. _ Aortic Valve: The aortic valve appears unchanged from  pre-bypass. _ Mitral Valve: The mitral valve appears unchanged from pre-bypass. _ Tricuspid Valve: The tricuspid valve appears unchanged from pre-bypass. _ Pulmonic Valve: The pulmonic valve appears unchanged from pre-bypass. _ Interatrial Septum: The interatrial septum appears unchanged from pre-bypass. _ Interventricular Septum: The interventricular septum appears unchanged from pre-bypass. _ Pericardium: The pericardium appears unchanged from  pre-bypass. PRE-OP FINDINGS  Left Ventricle: The left ventricle has normal systolic function, with an ejection fraction of 60-65%. The cavity size was normal. No evidence of left ventricular regional wall motion abnormalities. There is no left ventricular hypertrophy. Right Ventricle: The right ventricle has normal systolic function. The cavity was normal. There is no increase in right ventricular wall thickness. Left Atrium: Left atrial size was dilated. Right Atrium: Right atrial size was normal in size. Interatrial Septum: No atrial level shunt detected by color flow Doppler. Pericardium: There is no evidence of pericardial effusion. Mitral Valve: The mitral valve is normal in structure. Mitral valve regurgitation is mild by color flow Doppler. There is No evidence of mitral stenosis. Tricuspid Valve: The tricuspid valve was normal in structure. Tricuspid valve regurgitation is trivial by color flow Doppler. Aortic Valve: The aortic valve is tricuspid Aortic valve regurgitation was not visualized by color flow Doppler. There is no stenosis of the aortic valve. Pulmonic Valve: The pulmonic valve was not assessed. Pulmonic valve regurgitation was not assessed by color flow Doppler. Aorta: The aortic root, ascending aorta and aortic arch are normal in size and structure.  Debby Like MD Electronically signed by Debby Like MD Signature Date/Time: 05/23/2024/2:31:54 PM    Final    VAS US  DOPPLER PRE CABG Result Date: 05/22/2024 PREOPERATIVE VASCULAR EVALUATION Patient  Name:  Kordel P [facility]  Date of Exam:   05/22/2024 Medical Rec #: [id number]        Accession #:    [id number] Date of Birth: January 09, 1956        Patient Gender: M Patient Age:   40 years Exam Location:  [facility] Procedure:      VAS US  DOPPLER PRE CABG Referring Phys: WAYNE GOLD --------------------------------------------------------------------------------  Indications:      Pre-CABG. Risk Factors:     Hypertension, hyperlipidemia, Diabetes, current smoker                   (occasional cigar), coronary artery disease. Limitations:      Mechanical interference with ABI Doppler signal Comparison Study: No prior study on file Performing Technologist: Rachel Pellet RVS  Examination Guidelines: A complete evaluation includes B-mode imaging, spectral Doppler, color Doppler, and power Doppler as needed of all accessible portions of each vessel. Bilateral testing is considered an integral part of a complete examination. Limited examinations for reoccurring indications may be performed as noted.  Right Carotid Findings: +----------+--------+--------+--------+------------+------------------+           PSV cm/sEDV cm/sStenosisDescribe    Comments           +----------+--------+--------+--------+------------+------------------+ CCA Prox  81      18                          intimal thickening +----------+--------+--------+--------+------------+------------------+ CCA Distal93      21                          intimal thickening +----------+--------+--------+--------+------------+------------------+ ICA Prox  95      21              heterogenous                   +----------+--------+--------+--------+------------+------------------+ ICA Mid   122     33                                             +----------+--------+--------+--------+------------+------------------+  ICA Distal79      25                                              +----------+--------+--------+--------+------------+------------------+ ECA       134     22                                             +----------+--------+--------+--------+------------+------------------+ +----------+--------+-------+--------+------------+           PSV cm/sEDV cmsDescribeArm Pressure +----------+--------+-------+--------+------------+ Subclavian147                    113          +----------+--------+-------+--------+------------+ +---------+--------+--------+----------+ VertebralPSV cm/sEDV cm/sRetrograde +---------+--------+--------+----------+ Left Carotid Findings: +----------+--------+--------+--------+------------+------------------+           PSV cm/sEDV cm/sStenosisDescribe    Comments           +----------+--------+--------+--------+------------+------------------+ CCA Prox  88      16                          intimal thickening +----------+--------+--------+--------+------------+------------------+ CCA Distal124     21              heterogenous                   +----------+--------+--------+--------+------------+------------------+ ICA Prox  424     169     80-99%  calcific                       +----------+--------+--------+--------+------------+------------------+ ICA Mid   167     47                                             +----------+--------+--------+--------+------------+------------------+ ICA Distal62      18                                             +----------+--------+--------+--------+------------+------------------+ ECA       83      12                                             +----------+--------+--------+--------+------------+------------------+  +----------+--------+--------+--------+------------+ SubclavianPSV cm/sEDV cm/sDescribeArm Pressure +----------+--------+--------+--------+------------+           70      56              128           +----------+--------+--------+--------+------------+ +---------+--------+--+--------+--+ VertebralPSV cm/s47EDV cm/s16 +---------+--------+--+--------+--+  ABI Findings: +------------------+-----+--------+--------------------------------------------+ Rt Pressure (mmHg)IndexWaveformComment                                      +------------------+-----+--------+--------------------------------------------+ 113                                                                         +------------------+-----+--------+--------------------------------------------+  biphasicunable to get pressure secondary to                                         technical difficulties                       +------------------+-----+--------+--------------------------------------------+ 254               1.98                                                      +------------------+-----+--------+--------------------------------------------+ 73                0.57 Abnormal                                             +------------------+-----+--------+--------------------------------------------+ +------------------+-----+----------+ Lt Pressure (mmHg)IndexWaveform   +------------------+-----+----------+ 128                               +------------------+-----+----------+                        absent     +------------------+-----+----------+ 164               1.28 monophasic +------------------+-----+----------+ 81                0.63 Abnormal   +------------------+-----+----------+ +-------+---------------------+ ABI/TBIToday's ABI/TBI       +-------+---------------------+ Right  Non compressible/0.57 +-------+---------------------+ Left   non compressible/0.63 +-------+---------------------+  Right Doppler Findings: +--------+--------+--------------------------------+ Site    PressureComments                          +--------+--------+--------------------------------+ [id number]                                      +--------+--------+--------------------------------+ Radial          Incidental finding of AV Fistula +--------+--------+--------------------------------+  Left Doppler Findings: +--------+--------+ Site    Pressure +--------+--------+ [id number]      +--------+--------+  Technologist Notes: Mechanical interference, unable to get accurate waveforms. Mechanical interference, unable to get accurate waveforms.  Summary: Right Carotid: Velocities in the right ICA are consistent with a 1-39% stenosis. Left Carotid: Velocities in the left ICA are consistent with a 80-99% stenosis. Vertebrals: Left vertebral artery demonstrates antegrade flow. Right vertebral             artery demonstrates retrograde flow. Right ABI: Resting right ankle-brachial index indicates noncompressible right lower extremity arteries. The right toe-brachial index is abnormal. Left ABI: Resting left ankle-brachial index indicates noncompressible left lower extremity arteries. The left toe-brachial index is abnormal. Right Upper Extremity: Doppler waveforms remain within normal limits with right radial compression. Doppler waveforms remain within normal limits with right ulnar compression. Incidental finding of AV fistula at radial cath site. Left Upper Extremity: Doppler waveforms remain within normal limits with left radial compression. Doppler waveforms remain within normal limits with left  ulnar compression.  Suggest Peripheral Vascular Consult. Electronically signed by Debby Robertson on 05/22/2024 at 4:52:59 PM.    Final    CARDIAC CATHETERIZATION Result Date: 05/19/2024   Ost RCA to Prox RCA lesion is 100% stenosed.   Ost Cx to Prox Cx lesion is 90% stenosed.   1st Mrg lesion is 90% stenosed.   3rd Mrg lesion is 100% stenosed.   LPAV lesion is 100% stenosed with 100% stenosed side branch in LPDA.   Ost LAD lesion is 70%  stenosed.   Prox LAD lesion is 90% stenosed.   Mid LAD lesion is 95% stenosed.   Mid LAD to Dist LAD lesion is 85% stenosed.   The left ventricular systolic function is normal.   LV end diastolic pressure is normal.   The left ventricular ejection fraction is 55-65% by visual estimate. Critical multivessel obstructive CAD Normal LV function Normal LV filling pressures. LVEDP 9 mm Hg, PCWP 11/9, mean 11 mm Hg Normal right heart pressures. PAP 30/10, mean 17 mm Hg Normal cardiac output. 5.31 L/min, index 2.74 Plan: CT surgery consult for CABG. Not suitable for PCI   CT CORONARY FRACTIONAL FLOW RESERVE FLUID ANALYSIS Result Date: 05/19/2024 EXAM: FFRCT ANALYSIS for abnormal coronary CT-A. FINDINGS: FFRct analysis was performed on the original cardiac CT angiogram dataset. Diagrammatic representation of the FFRct analysis is provided in a separate PDF document in PACS. This dictation was created using the PDF document and an interactive 3D model of the results. 3D model is not available in the EMR/PACS. Normal FFR range is >0.80. 1. Left Main: Very short.  FFRct 1.0. 2. LAD: FFRct 0.87 ostial, 0.81 proximal, 0.72 mid. Distal LAD is not modeled. 3. LCX: FFRct 0.99 ostial. 0.8 proximal, 0.77 mid, 0.71 distal. L-PDA modeled as occluded. OM1 and OM2 are not modeled. OM3 FFRct 0.78 proximal, 0.57 distal. 4.       RCA: Occlusion at the ostium IMPRESSION: 1. FFRct findings are consistent with 100% occlusion at the ostium of the RCA and at the L-PDA. 2. Findings are concerning for severe stenosis in the mid LAD, distal LCX and OM3. 3.  Recommend cardiac catheterization. 4. LM is very short. Recommend care in engaging the LM, as there is almost dual ostia for the LAD and CX and concern for significant ostial disease in both vessels. Tiffany C. Raford, MD Electronically Signed   By: Annabella Raford M.D.   On: 05/19/2024 12:51   ECHOCARDIOGRAM COMPLETE Result Date: 05/19/2024    ECHOCARDIOGRAM REPORT   Patient Name:    [facility] Date of Exam: 05/19/2024 Medical Rec #:  [id number]       Height:       68.0 in Accession #:    [id number]      Weight:       177.0 lb Date of Birth:  [birth year]       BSA:          1.940 m Patient Age:    68 years        BP:           117/79 mmHg Patient Gender: M               HR:           50 bpm. Exam Location:  Inpatient Procedure: 2D Echo, Cardiac Doppler and Color Doppler (Both Spectral and Color            Flow Doppler were utilized during procedure). Indications:  Chest Pain R07.9  History:        Patient has no prior history of Echocardiogram examinations.                 Previous Myocardial Infarction; Risk Factors:Hypertension,                 Diabetes and Dyslipidemia.  Sonographer:    Tinnie Gosling RDCS Referring Phys: [id number] RONDELL A SMITH IMPRESSIONS  1. Left ventricular ejection fraction, by estimation, is 60 to 65%. The left ventricle has normal function. The left ventricle has no regional wall motion abnormalities. Left ventricular diastolic parameters were normal.  2. Right ventricular systolic function is normal. The right ventricular size is normal. Tricuspid regurgitation signal is inadequate for assessing PA pressure.  3. Left atrial size was mildly dilated.  4. The mitral valve is normal in structure. No evidence of mitral valve regurgitation. No evidence of mitral stenosis.  5. The aortic valve is normal in structure. Aortic valve regurgitation is not visualized. No aortic stenosis is present.  6. The inferior vena cava is normal in size with greater than 50% respiratory variability, suggesting right atrial pressure of 3 mmHg.  7. Cannot exclude a small PFO. Comparison(s): No prior Echocardiogram. FINDINGS  Left Ventricle: Left ventricular ejection fraction, by estimation, is 60 to 65%. The left ventricle has normal function. The left ventricle has no regional wall motion abnormalities. The left ventricular internal cavity size was normal in size. There is  no left  ventricular hypertrophy. Left ventricular diastolic parameters were normal. Right Ventricle: The right ventricular size is normal. No increase in right ventricular wall thickness. Right ventricular systolic function is normal. Tricuspid regurgitation signal is inadequate for assessing PA pressure. Left Atrium: Left atrial size was mildly dilated. Right Atrium: Right atrial size was normal in size. Pericardium: There is no evidence of pericardial effusion. Presence of epicardial fat layer. Mitral Valve: The mitral valve is normal in structure. No evidence of mitral valve regurgitation. No evidence of mitral valve stenosis. Tricuspid Valve: The tricuspid valve is normal in structure. Tricuspid valve regurgitation is trivial. No evidence of tricuspid stenosis. Aortic Valve: The aortic valve is normal in structure. Aortic valve regurgitation is not visualized. No aortic stenosis is present. Pulmonic Valve: The pulmonic valve was normal in structure. Pulmonic valve regurgitation is not visualized. No evidence of pulmonic stenosis. Aorta: The aortic root and ascending aorta are structurally normal, with no evidence of dilitation. Venous: The inferior vena cava is normal in size with greater than 50% respiratory variability, suggesting right atrial pressure of 3 mmHg. IAS/Shunts: Cannot exclude a small PFO.  LEFT VENTRICLE PLAX 2D LVIDd:         4.40 cm   Diastology LVIDs:         2.60 cm   LV e' medial:    7.29 cm/s LV PW:         1.00 cm   LV E/e' medial:  10.5 LV IVS:        1.00 cm   LV e' lateral:   7.94 cm/s LVOT diam:     2.00 cm   LV E/e' lateral: 9.7 LV SV:         71 LV SV Index:   37 LVOT Area:     3.14 cm  RIGHT VENTRICLE             IVC RV S prime:     10.40 cm/s  IVC diam: 1.60 cm TAPSE (M-mode): 2.2  cm LEFT ATRIUM             Index        RIGHT ATRIUM           Index LA diam:        3.50 cm 1.80 cm/m   RA Area:     12.20 cm LA Vol (A2C):   56.3 ml 29.02 ml/m  RA Volume:   26.10 ml  13.45 ml/m LA Vol  (A4C):   44.5 ml 22.93 ml/m LA Biplane Vol: 51.2 ml 26.39 ml/m  AORTIC VALVE LVOT Vmax:   96.00 cm/s LVOT Vmean:  66.500 cm/s LVOT VTI:    0.227 m  AORTA Ao Root diam: 2.90 cm Ao Asc diam:  3.30 cm MITRAL VALVE MV Area (PHT): 3.01 cm    SHUNTS MV Decel Time: 252 msec    Systemic VTI:  0.23 m MV E velocity: 76.70 cm/s  Systemic Diam: 2.00 cm MV A velocity: 83.60 cm/s MV E/A ratio:  0.92 Emeline Calender Electronically signed by Emeline Calender Signature Date/Time: 05/19/2024/11:46:14 AM    Final    DG Chest 2 View Result Date: 05/17/2024 EXAM: 2 VIEW(S) XRAY OF THE CHEST 05/17/2024 10:56:16 PM COMPARISON: None available. CLINICAL HISTORY: cp. 68 y/o male reports CP, dizziness and SHOB x several weeks. FINDINGS: LUNGS AND PLEURA: No focal pulmonary opacity. No pulmonary edema. No pleural effusion. No pneumothorax. HEART AND MEDIASTINUM: Atherosclerotic plaque. BONES AND SOFT TISSUES: No acute osseous abnormality. IMPRESSION: 1. No acute cardiopulmonary process detected. Electronically signed by: Morgane Naveau MD 05/17/2024 10:57 PM EDT RP Workstation: [id number]    Microbiology: Results for orders placed or performed during the hospital encounter of 05/17/24  Surgical pcr screen     Status: None   Collection Time: 05/21/24  8:43 PM   Specimen: Nasal Mucosa; Nasal Swab  Result Value Ref Range Status   MRSA, PCR NEGATIVE NEGATIVE Final   Staphylococcus aureus NEGATIVE NEGATIVE Final    Comment: (NOTE) The Xpert SA Assay (FDA approved for NASAL specimens in patients 57 years of age and older), is one component of a comprehensive surveillance program. It is not intended to diagnose infection nor to guide or monitor treatment. Performed at [facility], [address] [place], [place] [address]     Labs: CBC: Recent Labs  Lab 06/06/24 1553  WBC 11.2*  HGB 12.0*  HCT 36.8*  MCV 98.1  PLT 495*   Basic Metabolic Panel: Recent Labs  Lab 06/06/24 1553 06/08/24 0322  NA 138 139  K 4.8  3.9  CL 102 107  CO2 25 22  GLUCOSE 123* 105*  BUN 35* 22  CREATININE 1.75* 1.33*  CALCIUM  9.5 8.5*   Liver Function Tests: Recent Labs  Lab 06/06/24 1853  AST 23  ALT 16  ALKPHOS 56  BILITOT 0.4  PROT 7.4  ALBUMIN 4.0   CBG: Recent Labs  Lab 06/06/24 1556  GLUCAP 98    Discharge time spent: approximately 25 minutes spent on discharge counseling, evaluation of patient on day of discharge, and coordination of discharge planning with nursing, social work, pharmacy and case management  Signed: Lonni SHAUNNA Dalton, MD [facility] 06/08/2024

## 2024-06-08 NOTE — Progress Notes (Signed)
 Patient hospitalized

## 2024-06-08 NOTE — Progress Notes (Signed)
   Cardiology:  [facility]  Subjective:  Denies SSCP, palpitations or Dyspnea Ambulating in hall with HR 75-80 bpm  Objective:  Vitals:   06/07/24 1100 06/07/24 2017 06/07/24 2301 06/08/24 0357  BP: 104/61 126/70 (!) 145/73 125/71  Pulse: (!) 50 (!) 58 65 60  Resp: 14 20 20 20   Temp:  98.4 F (36.9 C) 98 F (36.7 C) 98.5 F (36.9 C)  TempSrc:  Oral Oral Oral  SpO2:  98% 97% 98%  Weight:      Height:        Intake/Output from previous day: No intake or output data in the 24 hours ending 06/08/24 0748  Physical Exam:  Affect appropriate Healthy:  appears stated age HEENT: normal Neck supple with no adenopathy JVP normal no bruits no thyromegaly Lungs clear with no wheezing and good diaphragmatic motion Heart:  S1/S2 no murmur, no rub, gallop or click PMI normal recent sternotomy Abdomen: benighn, BS positve, no tenderness, no AAA no bruit.  No HSM or HJR Distal pulses intact with no bruits No edema Neuro non-focal Skin warm and dry No muscular weakness   Lab Results: Basic Metabolic Panel: Recent Labs    06/06/24 1553 06/08/24 0322  NA 138 139  K 4.8 3.9  CL 102 107  CO2 25 22  GLUCOSE 123* 105*  BUN 35* 22  CREATININE 1.75* 1.33*  CALCIUM  9.5 8.5*   Liver Function Tests: Recent Labs    06/06/24 1853  AST 23  ALT 16  ALKPHOS 56  BILITOT 0.4  PROT 7.4  ALBUMIN 4.0   No results for input(s): LIPASE, AMYLASE in the last 72 hours. CBC: Recent Labs    06/06/24 1553  WBC 11.2*  HGB 12.0*  HCT 36.8*  MCV 98.1  PLT 495*    Imaging: DG Chest 2 View Result Date: 06/06/2024 EXAM: 2 VIEW(S) XRAY OF THE CHEST 06/06/2024 04:30:10 PM COMPARISON: Chest x-ray 05/25/2024. CLINICAL HISTORY: hypotension hypotension FINDINGS: LUNGS AND PLEURA: No focal pulmonary opacity. No pulmonary edema. There is scarring in the left lung base. There is a small left pleural effusion. No pneumothorax. HEART AND MEDIASTINUM: Surrounding wires are again seen.  The heart is mildly enlarged. BONES AND SOFT TISSUES: No acute osseous abnormality. IMPRESSION: 1. Small left pleural effusion. 2. Scarring in the left lung base. 3. Mildly enlarged heart. Electronically signed by: Greig Pique MD 06/06/2024 05:04 PM EST RP Workstation: [id number]    Cardiac Studies:  ECG: SB rate 46    Telemetry: NSR rate 70-80 bpm  Echo: 05/19/24 EF 60-65%   Medications:    aspirin  81 mg Oral Daily   rosuvastatin   40 mg Oral Daily   traZODone   100 mg Oral QHS      Assessment/Plan:   Bradycardia:  chronic made worse by amiodarone/lopressor started post op for PAF. In NSR Ok to d/c home off both drugs He monitors HR with his apple watch. Ambulating this am HR near 80's  CABG:  no angina sternum healing well continue ASA/statin  D/C home today  Maude Emmer 06/08/2024, 7:48 AM

## 2024-06-08 NOTE — Progress Notes (Signed)
 Order received to discharge patient.  Telemetry monitor removed and CCMD notified.  PIV access removed per DC RN.  Discharge instructions, follow up, medications and instructions for their use discussed with patient.  Patient transferred to DC lounge.

## 2024-06-08 NOTE — Progress Notes (Signed)
 Reviewed AVS, patient expressed understanding of medications, MD follow up reviewed.   Removed IV, Site clean, dry and intact.  See LDA for information on wounds at discharge. CCMD contacted and informed patients is being discharged.  Patient states all belongings brought to the hospital at time of admission are accounted for and packed to take home.  [facility] contacted to transport patient to Discharge lounge to wait for transportation home.

## 2024-06-09 ENCOUNTER — Encounter: Payer: Self-pay | Admitting: Adult Health

## 2024-06-09 ENCOUNTER — Ambulatory Visit (INDEPENDENT_AMBULATORY_CARE_PROVIDER_SITE_OTHER): Admitting: Internal Medicine

## 2024-06-09 ENCOUNTER — Encounter: Payer: Self-pay | Admitting: Internal Medicine

## 2024-06-09 ENCOUNTER — Ambulatory Visit: Admitting: Adult Health

## 2024-06-09 VITALS — BP 130/80 | HR 60 | Temp 98.0°F | Ht 68.0 in | Wt 173.0 lb

## 2024-06-09 VITALS — BP 148/80 | HR 57 | Ht 68.0 in | Wt 172.0 lb

## 2024-06-09 DIAGNOSIS — Z951 Presence of aortocoronary bypass graft: Secondary | ICD-10-CM

## 2024-06-09 DIAGNOSIS — F32A Depression, unspecified: Secondary | ICD-10-CM

## 2024-06-09 DIAGNOSIS — Z7984 Long term (current) use of oral hypoglycemic drugs: Secondary | ICD-10-CM

## 2024-06-09 DIAGNOSIS — R001 Bradycardia, unspecified: Secondary | ICD-10-CM

## 2024-06-09 DIAGNOSIS — F419 Anxiety disorder, unspecified: Secondary | ICD-10-CM

## 2024-06-09 DIAGNOSIS — E1159 Type 2 diabetes mellitus with other circulatory complications: Secondary | ICD-10-CM

## 2024-06-09 DIAGNOSIS — N179 Acute kidney failure, unspecified: Secondary | ICD-10-CM | POA: Diagnosis not present

## 2024-06-09 DIAGNOSIS — E785 Hyperlipidemia, unspecified: Secondary | ICD-10-CM

## 2024-06-09 DIAGNOSIS — E119 Type 2 diabetes mellitus without complications: Secondary | ICD-10-CM | POA: Insufficient documentation

## 2024-06-09 MED ORDER — ROSUVASTATIN CALCIUM 40 MG PO TABS: 40.0000 mg | ORAL_TABLET | Freq: Every day | ORAL | 3 refills | Status: AC

## 2024-06-09 MED ORDER — METFORMIN HCL ER 750 MG PO TB24: 750.0000 mg | ORAL_TABLET | Freq: Every day | ORAL | 3 refills | Status: AC

## 2024-06-09 MED ORDER — SERTRALINE HCL 25 MG PO TABS: 25.0000 mg | ORAL_TABLET | Freq: Every day | ORAL | 3 refills | Status: AC

## 2024-06-09 NOTE — Progress Notes (Signed)
 Name: Matthew Patterson  MRN/ DOB: [id number], 1955/10/09   Age/ Sex: 68 y.o., male    PCP: Merna Huxley, NP   Reason for Endocrinology Evaluation: Type 2 Diabetes Mellitus     Date of Initial Endocrinology Visit: 09/16/2021    PATIENT IDENTIFIER: Matthew Patterson is a 68 y.o. male with a past medical history of T2DM, Hx melanoma and dyslipidemia . The patient presented for initial endocrinology clinic visit on 09/16/2021 for consultative assistance with his diabetes management.    HPI: Matthew Patterson was    Diagnosed with DM 2018 Prior Medications tried/Intolerance: Has been on Metformin  for a few years . Ozempic  added 06/2021 Hemoglobin A1c has ranged from 6.6% in 2020, peaking at 11.9% in 2022.  On his initial visit to our clinic his A1c was 5.6%, he was already on metformin  and Ozempic  and we did not make any changes  Ozempic  stopped 10/2021  due to nausea and diarrhea    SUBJECTIVE:   During the last visit (12/02/2023 )A1c 6.2%    Today (06/09/24): Matthew Patterson  is here for follow-up on diabetes management.  He checks his blood sugars multiple  times daily, through [employer].  No hypoglycemia     Patient was hospitalized 05/17/2024 for shortness of breath and chest pain, upon admission, patient was noted with bradycardia and elevated BP.  Patient is s/p CABG x 3 in October, 2025  His postop course was complicated by lower extremity leg infection requiring antibiotics Patient presented to the ED again on 11//2025 due to dizziness due to hypotension and bradycardia  No dizziness  No chest pain  No nausea  No constipation or diarrhea    HOME DIABETES REGIMEN: Metformin  750 mg XR daily   Rosuvastatin  40 mg daily    Statin: yes ACE-I/ARB: yes  CONTINUOUS GLUCOSE MONITORING RECORD INTERPRETATION    Dates of Recording: 10/24 - 06/08/2024  Sensor description: Freestyle libre 3+  Results statistics:   CGM use % of time 58  Average and SD 121/23.5  Time in  range 96     %  % Time Above 180 4  % Time above 250 0  % Time Below target 0   Glycemic patterns summary: BGs are optimal throughout the night and most of the day  Hyperglycemic episodes postprandial  Hypoglycemic episodes occurred N/A  Overnight periods: Optimal   DIABETIC COMPLICATIONS: Microvascular complications:   Denies: CKD, neuropathy  Last eye exam: Completed 09/2023  Macrovascular complications:  CAD (s/p CABGx3 ) in 05/2024 Denies:  PVD, CVA   PAST HISTORY: Past Medical History:  Past Medical History:  Diagnosis Date   Anxiety and depression    Diabetes mellitus without complication (HCC)    GERD (gastroesophageal reflux disease)    Hyperlipidemia    Melanoma (HCC) 2023   back   Myocardial infarction (HCC) 05-17-24   Past Surgical History:  Past Surgical History:  Procedure Laterality Date   CORONARY ARTERY BYPASS GRAFT N/A 05/23/2024   Procedure: CORONARY ARTERY BYPASS GRAFTING (CABG) X THREE, USING LEFT INTERNAL MAMMARY ARTERY AND RIGHT LEG GREATER SAPHENOUS VEIN HARVESTED ENDOSCOPICALLY;  Surgeon: Shyrl Linnie KIDD, MD;  Location: [facility] OR;  Service: Open Heart Surgery;  Laterality: N/A;   RIGHT/LEFT HEART CATH AND CORONARY ANGIOGRAPHY N/A 05/19/2024   Procedure: RIGHT/LEFT HEART CATH AND CORONARY ANGIOGRAPHY;  Surgeon: Jordan, Peter M, MD;  Location: [facility] INVASIVE CV LAB;  Service: Cardiovascular;  Laterality: N/A;   ROTATOR CUFF REPAIR     SHOULDER SURGERY  2010   B/L    Social History:  reports that he has quit smoking. His smoking use included cigarettes and cigars. He started smoking about 15 years ago. He has never used smokeless tobacco. He reports current alcohol use of about 1.0 - 2.0 standard drink of alcohol per week. He reports that he does not use drugs. Family History:  Family History  Problem Relation Age of Onset   Early death Father    Early death Maternal Grandfather    Diabetes Mother    Hypertension Mother    Heart disease Mother     Cancer Sister        uterine cancer   Cancer Sister    Cancer Sister    Cancer Sister      HOME MEDICATIONS: Allergies as of 06/09/2024   No Known Allergies      Medication List        Accurate as of June 09, 2024  9:59 AM. If you have any questions, ask your nurse or doctor.          acetaminophen  325 MG tablet Commonly known as: TYLENOL  Take 2 tablets (650 mg total) by mouth every 6 (six) hours as needed. What changed:  when to take this reasons to take this   aspirin EC 325 MG tablet Take 1 tablet (325 mg total) by mouth daily.   fenofibrate  145 MG tablet Commonly known as: TRICOR  TAKE 1 TABLET BY MOUTH DAILY   FreeStyle Libre 3 Plus Sensor Misc 1 Device by Other route every 14 (fourteen) days. Change sensor every 15 days.   glucose blood test strip Use as instructed   metFORMIN  750 MG 24 hr tablet Commonly known as: GLUCOPHAGE -XR Take 1 tablet (750 mg total) by mouth daily with breakfast.   onetouch ultrasoft lancets Use as instructed   rosuvastatin  40 MG tablet Commonly known as: CRESTOR  Take 1 tablet (40 mg total) by mouth daily. What changed: when to take this   sertraline 25 MG tablet Commonly known as: ZOLOFT Take 1 tablet (25 mg total) by mouth daily. Started by: Cory Nafziger   traMADol  50 MG tablet Commonly known as: ULTRAM  Take 50 mg by mouth at bedtime as needed for moderate pain (pain score 4-6) or severe pain (pain score 7-10).   traZODone  100 MG tablet Commonly known as: DESYREL  TAKE 1 TABLET BY MOUTH AT BEDTIME         ALLERGIES: No Known Allergies   REVIEW OF SYSTEMS: A comprehensive ROS was conducted with the patient and is negative except as per HPI   OBJECTIVE:   VITAL SIGNS: BP (!) 148/80   Pulse (!) 57   Ht 5' 8 (1.727 m)   Wt 172 lb (78 kg)   SpO2 98%   BMI 26.15 kg/m    PHYSICAL EXAM:  General: Pt appears well and is in NAD  Lungs: Clear with good BS bilat  Heart: RRR   Abdomen: soft,  nontender  Extremities:  Lower extremities - No pretibial edema.  Neuro: MS is good with appropriate affect, pt is alert and Ox3    DM foot exam: 06/09/2024  The skin of the feet is intact without sores or ulcerations. The pedal pulses are 2+ on right and 2+ on left. The sensation is intact to a screening 5.07, 10 gram monofilament bilaterally      DATA REVIEWED:  Lab Results  Component Value Date   HGBA1C 6.1 (H) 05/22/2024   HGBA1C 6.2 (A) 12/02/2023  HGBA1C 5.9 (A) 06/03/2023    Latest Reference Range & Units 06/08/24 03:22  Sodium 135 - 145 mmol/L 139  Potassium 3.5 - 5.1 mmol/L 3.9  Chloride 98 - 111 mmol/L 107  CO2 22 - 32 mmol/L 22  Glucose 70 - 99 mg/dL 894 (H)  BUN 8 - 23 mg/dL 22  Creatinine 9.38 - 8.75 mg/dL 8.66 (H)  Calcium  8.9 - 10.3 mg/dL 8.5 (L)  Anion gap 5 - 15  10  GFR, Estimated >60 mL/min 58 (L)     ASSESSMENT / PLAN / RECOMMENDATIONS:   1) Type 2 Diabetes Mellitus, Optimally controlled, With macrovascular complications - Most recent A1c of 6.1 %. Goal A1c < 7.0 %.     -A1c remains optimal -We held Ozempic  due to nausea and diarrhea - No changes at this time, but given recent CAD diagnosis I have recommended SGLT2 inhibitors in the future if once he is medically stable from postop complications   MEDICATIONS: Continue  Metformin  750 mg XR daily   EDUCATION / INSTRUCTIONS: BG monitoring instructions: Patient is instructed to check his blood sugars 3 times a day, before meals . Call [facility] if: BG persistently < 70 I reviewed the Rule of 15 for the treatment of hypoglycemia in detail with the patient. Literature supplied.   2) Diabetic complications:  Eye: Does not have known diabetic retinopathy.  Neuro/ Feet: Does not have known diabetic peripheral neuropathy. Renal: Patient does not have known baseline CKD. He is  on an ACEI/ARB at present.   3) Dyslipidemia:   -Due to LDL elevation from 80 to 117 MGs/DL, I  switched atorvastatin  40 mg to rosuvastatin  40 mg daily in May, 2025   Medication  Continue rosuvastatin  40 mg daily    F/U in 6 months     Signed electronically by: Stefano Redgie Butts, MD  [facility]  [facility] [address] [place] [place], [place] [address] Phone: [phone] FAX: [phone]   CC: Merna Huxley, NP [address] [place] [address] Phone: [phone]  Fax: [phone]    Return to Endocrinology clinic as below: Future Appointments  Date Time Provider Department Center  06/09/2024 10:10 AM Donyae Kohn, Donell Redgie, MD [facility] None  06/12/2024  1:20 PM Azobou Donley Joelle DEL, MD [facility]  07/06/2024  8:40 AM Lanis Fonda BRAVO, MD [facility]V  07/11/2024  2:30 PM Merna Huxley, NP [facility] [address]  05/10/2025  7:00 AM Merna Huxley, NP [facility] [address]

## 2024-06-09 NOTE — Progress Notes (Signed)
    Cardiology Office Note Date:  06/12/2024  ID:  Bracy, Pepper 1956/06/14, MRN [id number] PCP:  Merna Huxley, NP  Cardiologist:  Joelle VEAR Ren Donley, MD  No chief complaint on file.     Problems CAD s/p CABG x 3 LAD, OM1, OM2 10/25 TTE 10/25 60-65% Post-op Afib on PO amiodarone PAD with RLE/LLE abnormal TBI AV fistula at radial cath site Left carotid stenosis 80-99% DM Early family Hx of ASCVD M: ASA 325, RN40 LDL 71 10/25  Visits  11/25: ED for orthostasis and bradycardia. Received IVF and stopped metop and amiodarone.  11/25: 2d echo, ezetimibe 10, LP in 3 months, 30 day EM, vascular surgery follow up    History of Present Illness: Matthew Patterson is a 68 y.o. male who presents for post hospitalization.   He has been doing well since discharge.  He recently presented to the ED for orthostasis and bradycardia.  At time, his metoprolol and amiodarone were discontinued.  Since then, he has been doing very well.  He denies any chest pain or dyspnea, though has not been very active.  He denies any presyncope or claudication.  He reported his current stress is feeling very well and has been started on antibiotics for some erythema on his vein graft site.  ROS: Please see the history of present illness. All other systems are reviewed and negative.    PHYSICAL EXAM: VS:  BP 120/64 (BP Location: Left Arm, Patient Position: Sitting, Cuff Size: Normal)   Pulse 67   Ht 5' 8 (1.727 m)   Wt 175 lb (79.4 kg)   SpO2 94%   BMI 26.61 kg/m  , BMI Body mass index is 26.61 kg/m. GEN: Well nourished, well developed, in no acute distress HEENT: normal Neck: no JVD, carotid bruits, or masses Cardiac: RRR; no murmurs, rubs, or gallops,no edema  Respiratory:  CTAB bilaterally, normal work of breathing GI: soft, nontender, nondistended, + BS Extremities: No LE edema Skin: warm and dry, no rash Neuro:  Strength and sensation are intact  Recent Labs: Reviewed  Studies:  Reviewed  ASSESSMENT AND PLAN: STYLES FAMBRO is a 68 y.o. male who presents for post hospitalization.  - Seems to be doing well overall.  Denies any angina, dyspnea, or claudication.  He has been referred to cardiac rehab.  Given that LDL is not at goal, we will start ezetimibe 10 mg daily and repeat lipid panel in about 3 months. - We will also obtain a 2D echocardiogram since he is post CABG. - His amiodarone was recently stopped.  We will do a 30-day event monitor to screen for A-fib. - Follow-up with vascular surgery regarding his carotid stenosis as well as his abnormal TBI's. - Follow-up with me in 4 months.   Signed, Joelle VEAR Ren Donley, MD  06/12/2024 2:13 PM    [facility]

## 2024-06-09 NOTE — Patient Instructions (Addendum)
 I would suggest considering Jardiance or Doreen in the future, if this is covered by insurance due to the cardiovascular benefits

## 2024-06-09 NOTE — Progress Notes (Signed)
 Subjective:    Patient ID: Matthew Patterson, male    DOB: 10-10-1955, 68 y.o.   MRN: [id number]  HPI 68 year old male who  has a past medical history of Anxiety and depression, Diabetes mellitus without complication (HCC), GERD (gastroesophageal reflux disease), Hyperlipidemia, and Melanoma (HCC) (2023).  He presents to the office today for TCM visit   He was originally admitted from 05/17/2024 through 05/28/2024 presenting to the emergency room with complaints of shortness of breath and chest pain.  He did have some pain that did radiate to his back as well as dizziness.  Symptoms have been present for couple weeks and resolved with rest.  Chest CT did reveal findings of severe RCA and significant left main disease.  There is also moderate disease in the LAD and circumflex systems.  He had a cardiac cath performed which showed three-vessel coronary artery disease.  He was not felt to be a suitable candidate for PCI.  He had CABG x 3 on 05/23/2024 LIMA to LAD, SVG to OM1 and SVG to OM2's were placed. His post op was complicated by Afib that converted to SR prior to discharge.    He was discharged on a new prescription for metoprolol and amiodarone and developed orthostatic symptoms, his wife checked his BP and heart rate when SBP was in the 80s with heart rate in the 40s at which point he was admitted on 06/06/2024 and discharged on 06/08/2024.  ER was consulted in the ER and he was given IV fluids, metoprolol and amiodarone were held.  Overnight his bradycardia resolved, his blood pressure normalized and his creatinine improved.  He was able to ambulate with a heart rate in the 80s.  Recommend close follow-up in the office did not feel as though Zio patch was needed.  Metoprolol and amiodarone were held at discharge  He has a pending appointment with cardiology early next week.   Today he reports that he is feeling much better since stopping Metoprolol and amiodarone. He has not had any further  chest pain, shortness of breath , dizziness,or lightheadedness.   He does reports that since his open heart surgery that he has been experiencing mood swings, depression, anxiety and panic attacks. Reports that he will be relaxing at home and start crying for no reason, feel angry or go into a full blown panic attack. He would like to start medication for this   Review of Systems  Respiratory: Negative.    Cardiovascular: Negative.   Gastrointestinal: Negative.   Genitourinary: Negative.   Musculoskeletal: Negative.   Neurological: Negative.   Hematological: Negative.   Psychiatric/Behavioral:  Positive for agitation and dysphoric mood. The patient is nervous/anxious.    Past Medical History:  Diagnosis Date   Anxiety and depression    Diabetes mellitus without complication (HCC)    GERD (gastroesophageal reflux disease)    Hyperlipidemia    Melanoma (HCC) 2023   back    Social History   Socioeconomic History   Marital status: Married    Spouse name: Not on file   Number of children: Not on file   Years of education: Not on file   Highest education level: Associate degree: academic program  Occupational History   Not on file  Tobacco Use   Smoking status: Former    Types: Cigarettes, Cigars    Start date: 2010   Smokeless tobacco: Never  Vaping Use   Vaping status: Never Used  Substance and Sexual Activity  Alcohol use: Yes    Alcohol/week: 1.0 - 2.0 standard drink of alcohol    Types: 1 - 2 Cans of beer per week   Drug use: No   Sexual activity: Not on file  Other Topics Concern   Not on file  Social History Narrative   Senior buyer for manufacturing    Married   One children    One grandchild      He likes to play golf    Social Drivers of Health   Financial Resource Strain: Low Risk  (08/10/2023)   Overall Financial Resource Strain (CARDIA)    Difficulty of Paying Living Expenses: Not hard at all  Food Insecurity: No Food Insecurity (06/07/2024)    Hunger Vital Sign    Worried About Running Out of Food in the Last Year: Never true    Ran Out of Food in the Last Year: Never true  Transportation Needs: No Transportation Needs (06/07/2024)   PRAPARE - Administrator, Civil Service (Medical): No    Lack of Transportation (Non-Medical): No  Physical Activity: Insufficiently Active (08/10/2023)   Exercise Vital Sign    Days of Exercise per Week: 5 days    Minutes of Exercise per Session: 20 min  Stress: No Stress Concern Present (08/10/2023)   [employer] of Occupational Health - Occupational Stress Questionnaire    Feeling of Stress : Only a little  Social Connections: Socially Integrated (06/07/2024)   Social Connection and Isolation Panel    Frequency of Communication with Friends and Family: Twice a week    Frequency of Social Gatherings with Friends and Family: Once a week    Attends Religious Services: More than 4 times per year    Active Member of [employer] or Organizations: Yes    Attends Engineer, Structural: More than 4 times per year    Marital Status: Married  Catering Manager Violence: Not At Risk (06/07/2024)   Humiliation, Afraid, Rape, and Kick questionnaire    Fear of Current or Ex-Partner: No    Emotionally Abused: No    Physically Abused: No    Sexually Abused: No    Past Surgical History:  Procedure Laterality Date   CORONARY ARTERY BYPASS GRAFT N/A 05/23/2024   Procedure: CORONARY ARTERY BYPASS GRAFTING (CABG) X THREE, USING LEFT INTERNAL MAMMARY ARTERY AND RIGHT LEG GREATER SAPHENOUS VEIN HARVESTED ENDOSCOPICALLY;  Surgeon: Shyrl Linnie KIDD, MD;  Location: [facility] OR;  Service: Open Heart Surgery;  Laterality: N/A;   RIGHT/LEFT HEART CATH AND CORONARY ANGIOGRAPHY N/A 05/19/2024   Procedure: RIGHT/LEFT HEART CATH AND CORONARY ANGIOGRAPHY;  Surgeon: Jordan, Peter M, MD;  Location: [facility] INVASIVE CV LAB;  Service: Cardiovascular;  Laterality: N/A;   ROTATOR CUFF REPAIR     SHOULDER SURGERY  2010    B/L    Family History  Problem Relation Age of Onset   Diabetes Mother    Hypertension Mother    Cancer Sister        uterine cancer    No Known Allergies  Current Outpatient Medications on File Prior to Visit  Medication Sig Dispense Refill   acetaminophen  (TYLENOL ) 325 MG tablet Take 2 tablets (650 mg total) by mouth every 6 (six) hours as needed. (Patient taking differently: Take 650 mg by mouth daily as needed for mild pain (pain score 1-3) or moderate pain (pain score 4-6).)     aspirin EC 325 MG tablet Take 1 tablet (325 mg total) by mouth daily. 100 tablet 3  Continuous Glucose Sensor (FREESTYLE LIBRE 3 PLUS SENSOR) MISC 1 Device by Other route every 14 (fourteen) days. Change sensor every 15 days. 6 each 3   fenofibrate  (TRICOR ) 145 MG tablet TAKE 1 TABLET BY MOUTH DAILY 90 tablet 3   glucose blood test strip Use as instructed 100 each 12   Lancets (ONETOUCH ULTRASOFT) lancets Use as instructed 100 each 12   metFORMIN  (GLUCOPHAGE -XR) 750 MG 24 hr tablet Take 1 tablet (750 mg total) by mouth daily with breakfast. 90 tablet 3   rosuvastatin  (CRESTOR ) 40 MG tablet Take 1 tablet (40 mg total) by mouth daily. (Patient taking differently: Take 40 mg by mouth every evening.) 90 tablet 3   traMADol  (ULTRAM ) 50 MG tablet Take 50 mg by mouth at bedtime as needed for moderate pain (pain score 4-6) or severe pain (pain score 7-10).     traZODone  (DESYREL ) 100 MG tablet TAKE 1 TABLET BY MOUTH AT BEDTIME 90 tablet 1   No current facility-administered medications on file prior to visit.    BP 130/80   Pulse 60   Temp 98 F (36.7 C) (Oral)   Ht 5' 8 (1.727 m)   Wt 173 lb (78.5 kg)   SpO2 95%   BMI 26.30 kg/m       Objective:   Physical Exam Vitals and nursing note reviewed.  Constitutional:      Appearance: Normal appearance.  Cardiovascular:     Rate and Rhythm: Normal rate and regular rhythm.     Pulses: Normal pulses.     Heart sounds: Normal heart sounds.  Pulmonary:      Effort: Pulmonary effort is normal.     Breath sounds: Normal breath sounds.  Chest:    Skin:    General: Skin is warm and dry.     Comments: Well healing surgical scar on chest wall and right upper leg   Neurological:     General: No focal deficit present.     Mental Status: He is alert and oriented to person, place, and time.  Psychiatric:        Mood and Affect: Mood normal.        Behavior: Behavior normal.        Thought Content: Thought content normal.        Judgment: Judgment normal.        Assessment & Plan:  1. S/P CABG x 3 (Primary) - Reviewed hospital notes, discharge instructions, labs, imaging and medication changes. All questions answered to the best of my ability  - Follow up with Cardiology as directed  2. Sinus bradycardia - Resolved with d/c Metoprolol and Amiodarone   3.Acute kidney injury  - Kidney function improving with labs done yesterday. Will not repeat labs today since Cardiology will likely due blood work next week  - Stay hydrated   4. Anxiety and depression - Will start on Zoloft 25 mg daily  - Follow up in 30 days  - sertraline (ZOLOFT) 25 MG tablet; Take 1 tablet (25 mg total) by mouth daily.  Dispense: 30 tablet; Refill: 3  Marlee Armenteros, NP

## 2024-06-11 ENCOUNTER — Other Ambulatory Visit: Payer: Self-pay | Admitting: Adult Health

## 2024-06-11 DIAGNOSIS — E781 Pure hyperglyceridemia: Secondary | ICD-10-CM

## 2024-06-11 DIAGNOSIS — G4700 Insomnia, unspecified: Secondary | ICD-10-CM

## 2024-06-12 ENCOUNTER — Ambulatory Visit

## 2024-06-12 ENCOUNTER — Other Ambulatory Visit: Payer: Self-pay

## 2024-06-12 VITALS — BP 120/64 | HR 67 | Ht 68.0 in | Wt 175.0 lb

## 2024-06-12 DIAGNOSIS — I4891 Unspecified atrial fibrillation: Secondary | ICD-10-CM

## 2024-06-12 DIAGNOSIS — E785 Hyperlipidemia, unspecified: Secondary | ICD-10-CM

## 2024-06-12 DIAGNOSIS — Z951 Presence of aortocoronary bypass graft: Secondary | ICD-10-CM

## 2024-06-12 DIAGNOSIS — I2510 Atherosclerotic heart disease of native coronary artery without angina pectoris: Secondary | ICD-10-CM

## 2024-06-12 DIAGNOSIS — I10 Essential (primary) hypertension: Secondary | ICD-10-CM | POA: Diagnosis not present

## 2024-06-12 DIAGNOSIS — Z8249 Family history of ischemic heart disease and other diseases of the circulatory system: Secondary | ICD-10-CM

## 2024-06-12 MED ORDER — EZETIMIBE 10 MG PO TABS: 10.0000 mg | ORAL_TABLET | Freq: Every day | ORAL | 3 refills | Status: AC

## 2024-06-12 NOTE — Patient Instructions (Signed)
 Medication Instructions:  START: Ezetimibe (Zetia) 10 mg (1 tablet) daily *If you need a refill on your cardiac medications before your next appointment, please call your pharmacy*  Lab Work: Labs in 3 months: Lipid If you have labs (blood work) drawn today and your tests are completely normal, you will receive your results only by: MyChart Message (if you have MyChart) OR A paper copy in the mail If you have any lab test that is abnormal or we need to change your treatment, we will call you to review the results.  Testing/Procedures: Your physician has requested that you have an echocardiogram. Echocardiography is a painless test that uses sound waves to create images of your heart. It provides your doctor with information about the size and shape of your heart and how well your heart's chambers and valves are working. This procedure takes approximately one hour. There are no restrictions for this procedure. Please do NOT wear cologne, perfume, aftershave, or lotions (deodorant is allowed). Please arrive 15 minutes prior to your appointment time.  Please note: We ask at that you not bring children with you during ultrasound (echo/ vascular) testing. Due to room size and safety concerns, children are not allowed in the ultrasound rooms during exams. Our front office staff cannot provide observation of children in our lobby area while testing is being conducted. An adult accompanying a patient to their appointment will only be allowed in the ultrasound room at the discretion of the ultrasound technician under special circumstances. We apologize for any inconvenience.   Follow-Up: At [facility], you and your health needs are our priority.  As part of our continuing mission to provide you with exceptional heart care, our providers are all part of one team.  This team includes your primary Cardiologist (physician) and Advanced Practice Providers or APPs (Physician Assistants and Nurse  Practitioners) who all work together to provide you with the care you need, when you need it.  Your next appointment:   4 month(s)  Provider:   Joelle VEAR Ren Donley, MD   We recommend signing up for the patient portal called MyChart.  Sign up information is provided on this After Visit Summary.  MyChart is used to connect with patients for Virtual Visits (Telemedicine).  Patients are able to view lab/test results, encounter notes, upcoming appointments, etc.  Non-urgent messages can be sent to your provider as well.   To learn more about what you can do with MyChart, go to [email or web address].   Other Instructions [employer] Cardiac Event Monitor Instructions  Your physician has requested you wear your cardiac event monitor for ___30__ days, (1-30). [employer] may call or text to confirm a shipping address. The monitor will be sent to a land address via UPS. [employer] will not ship a monitor to a PO BOX. It typically takes 3-5 days to receive your monitor after it has been enrolled. [employer] will assist with USPS tracking if your package is delayed. The telephone number for [employer] is [phone]. Once you have received your monitor, please review the enclosed instructions. Instruction tutorials can also be viewed under help and settings on the enclosed cell phone. Your monitor has already been registered assigning a specific monitor serial # to you.  Billing and Self Pay Discount Information  [employer] has been provided the insurance information we had on file for you.  If your insurance has been updated, please call [employer] at [phone] to provide them with your updated insurance information.   [employer] offers a discounted  Self Pay option for patients who have insurance that does not cover their cardiac event monitor or patients without insurance.  The discounted cost of a Self Pay Cardiac Event Monitor would be $225.00 , if the patient contacts [employer] at  [phone] within 7 days of applying the monitor to make payment arrangements.  If the patient does not contact [employer] within 7 days of applying the monitor, the cost of the cardiac event monitor will be $350.00.  Applying the monitor  Remove cell phone from case and turn it on. The cell phone works as it consultant and needs to be within [facility] of you at all times. The cell phone will need to be charged on a daily basis. We recommend you plug the cell phone into the enclosed charger at your bedside table every night.  Monitor batteries: You will receive two monitor batteries labelled #1 and #2. These are your recorders. Plug battery #2 onto the second connection on the enclosed charger. Keep one battery on the charger at all times. This will keep the monitor battery deactivated. It will also keep it fully charged for when you need to switch your monitor batteries. A small light will be blinking on the battery emblem when it is charging. The light on the battery emblem will remain on when the battery is fully charged.  Open package of a Monitor strip. Insert battery #1 into black hood on strip and gently squeeze monitor battery onto connection as indicated in instruction booklet. Set aside while preparing skin.  Choose location for your strip, vertical or horizontal, as indicated in the instruction booklet. Shave to remove all hair from location. There cannot be any lotions, oils, powders, or colognes on skin where monitor is to be applied. Wipe skin clean with enclosed Saline wipe. Dry skin completely.  Peel paper labeled #1 off the back of the Monitor strip exposing the adhesive. Place the monitor on the chest in the vertical or horizontal position shown in the instruction booklet. One arrow on the monitor strip must be pointing upward. Carefully remove paper labeled #2, attaching remainder of strip to your skin. Try not to create any folds or wrinkles in the strip as you apply  it.  Firmly press and release the circle in the center of the monitor battery. You will hear a small beep. This is turning the monitor battery on. The heart emblem on the monitor battery will light up every 5 seconds if the monitor battery in turned on and connected to the patient securely. Do not push and hold the circle down as this turns the monitor battery off. The cell phone will locate the monitor battery. A screen will appear on the cell phone checking the connection of your monitor strip. This may read poor connection initially but change to good connection within the next minute. Once your monitor accepts the connection you will hear a series of 3 beeps followed by a climbing crescendo of beeps. A screen will appear on the cell phone showing the two monitor strip placement options. Touch the picture that demonstrates where you applied the monitor strip.  Your monitor strip and battery are waterproof. You are able to shower, bathe, or swim with the monitor on. They just ask you do not submerge deeper than 3 feet underwater. We recommend removing the monitor if you are swimming in a lake, river, or ocean.  Your monitor battery will need to be switched to a fully charged monitor battery approximately once a week.  The cell phone will alert you of an action which needs to be made.  On the cell phone, tap for details to reveal connection status, monitor battery status, and cell phone battery status. The green dots indicates your monitor is in good status. A red dot indicates there is something that needs your attention.  To record a symptom, click the circle on the monitor battery. In 30-60 seconds a list of symptoms will appear on the cell phone. Select your symptom and tap save. Your monitor will record a sustained or significant arrhythmia regardless of you clicking the button. Some patients do not feel the heart rhythm irregularities. [employer] will notify us  of any serious or  critical events.  Refer to instruction booklet for instructions on switching batteries, changing strips, the Do not disturb or Pause features, or any additional questions.  Call [employer] at [phone], to confirm your monitor is transmitting and record your baseline. They will answer any questions you may have regarding the monitor instructions at that time.  Returning the monitor to [employer]  Place all equipment back into blue box. Peel off strip of paper to expose adhesive and close box securely. There is a prepaid UPS shipping label on this box. Drop in a UPS drop box, or at a UPS facility like [employer]. You may also contact [employer] to arrange UPS to pick up monitor package at your home.

## 2024-06-20 NOTE — Progress Notes (Unsigned)
 Office Note     CC:  Carotid stenosis Requesting Provider:  Merna Huxley, NP  HPI: DAQUANE AGUILAR is a 68 y.o. (1955/12/29) male presenting at the request of .Nafziger, Huxley, NP for left carotid artery stenosis appreciated prior to CABG.  Patient underwent two-vessel CABG by Dr. Shyrl on 05/23/2024.  The pt is *** on a statin for cholesterol management.  The pt is *** on a daily aspirin.   Other AC:  *** The pt is *** on medication for hypertension.   The pt is *** diabetic.  Tobacco hx:  ***  Past Medical History:  Diagnosis Date   Anxiety and depression    Diabetes mellitus without complication (HCC)    GERD (gastroesophageal reflux disease)    Hyperlipidemia    Melanoma (HCC) 2023   back   Myocardial infarction (HCC) 05-17-24    Past Surgical History:  Procedure Laterality Date   CORONARY ARTERY BYPASS GRAFT N/A 05/23/2024   Procedure: CORONARY ARTERY BYPASS GRAFTING (CABG) X THREE, USING LEFT INTERNAL MAMMARY ARTERY AND RIGHT LEG GREATER SAPHENOUS VEIN HARVESTED ENDOSCOPICALLY;  Surgeon: Shyrl Linnie KIDD, MD;  Location: [facility] OR;  Service: Open Heart Surgery;  Laterality: N/A;   RIGHT/LEFT HEART CATH AND CORONARY ANGIOGRAPHY N/A 05/19/2024   Procedure: RIGHT/LEFT HEART CATH AND CORONARY ANGIOGRAPHY;  Surgeon: Jordan, Peter M, MD;  Location: [facility] INVASIVE CV LAB;  Service: Cardiovascular;  Laterality: N/A;   ROTATOR CUFF REPAIR     SHOULDER SURGERY  2010   B/L    Social History   Socioeconomic History   Marital status: Married    Spouse name: Not on file   Number of children: Not on file   Years of education: Not on file   Highest education level: Associate degree: academic program  Occupational History   Not on file  Tobacco Use   Smoking status: Former    Types: Cigarettes, Cigars    Start date: 2010   Smokeless tobacco: Never  Vaping Use   Vaping status: Never Used  Substance and Sexual Activity   Alcohol use: Yes    Alcohol/week: 1.0 - 2.0  standard drink of alcohol    Types: 1 - 2 Cans of beer per week   Drug use: No   Sexual activity: Not on file  Other Topics Concern   Not on file  Social History Narrative   Senior buyer for manufacturing    Married   One children    One grandchild      He likes to play golf    Social Drivers of Health   Financial Resource Strain: Low Risk  (08/10/2023)   Overall Financial Resource Strain (CARDIA)    Difficulty of Paying Living Expenses: Not hard at all  Food Insecurity: No Food Insecurity (06/07/2024)   Hunger Vital Sign    Worried About Running Out of Food in the Last Year: Never true    Ran Out of Food in the Last Year: Never true  Transportation Needs: No Transportation Needs (06/07/2024)   PRAPARE - Administrator, Civil Service (Medical): No    Lack of Transportation (Non-Medical): No  Physical Activity: Insufficiently Active (08/10/2023)   Exercise Vital Sign    Days of Exercise per Week: 5 days    Minutes of Exercise per Session: 20 min  Stress: No Stress Concern Present (08/10/2023)   [employer] of Occupational Health - Occupational Stress Questionnaire    Feeling of Stress : Only a little  Social Connections:  Socially Integrated (06/07/2024)   Social Connection and Isolation Panel    Frequency of Communication with Friends and Family: Twice a week    Frequency of Social Gatherings with Friends and Family: Once a week    Attends Religious Services: More than 4 times per year    Active Member of [employer] or Organizations: Yes    Attends Engineer, Structural: More than 4 times per year    Marital Status: Married  Catering Manager Violence: Not At Risk (06/07/2024)   Humiliation, Afraid, Rape, and Kick questionnaire    Fear of Current or Ex-Partner: No    Emotionally Abused: No    Physically Abused: No    Sexually Abused: No   *** Family History  Problem Relation Age of Onset   Early death Father    Early death Maternal Grandfather    Diabetes  Mother    Hypertension Mother    Heart disease Mother    Cancer Sister        uterine cancer   Cancer Sister    Cancer Sister    Cancer Sister     Current Outpatient Medications  Medication Sig Dispense Refill   acetaminophen  (TYLENOL ) 325 MG tablet Take 2 tablets (650 mg total) by mouth every 6 (six) hours as needed. (Patient taking differently: Take 650 mg by mouth daily as needed for mild pain (pain score 1-3) or moderate pain (pain score 4-6).)     aspirin EC 325 MG tablet Take 1 tablet (325 mg total) by mouth daily. 100 tablet 3   Continuous Glucose Sensor (FREESTYLE LIBRE 3 PLUS SENSOR) MISC 1 Device by Other route every 14 (fourteen) days. Change sensor every 15 days. 6 each 3   ezetimibe (ZETIA) 10 MG tablet Take 1 tablet (10 mg total) by mouth daily. 90 tablet 3   fenofibrate  (TRICOR ) 145 MG tablet TAKE 1 TABLET BY MOUTH DAILY 90 tablet 3   glucose blood test strip Use as instructed 100 each 12   Lancets (ONETOUCH ULTRASOFT) lancets Use as instructed 100 each 12   metFORMIN  (GLUCOPHAGE -XR) 750 MG 24 hr tablet Take 1 tablet (750 mg total) by mouth daily with breakfast. 90 tablet 3   rosuvastatin  (CRESTOR ) 40 MG tablet Take 1 tablet (40 mg total) by mouth daily. 90 tablet 3   sertraline (ZOLOFT) 25 MG tablet Take 1 tablet (25 mg total) by mouth daily. 30 tablet 3   traMADol  (ULTRAM ) 50 MG tablet Take 50 mg by mouth at bedtime as needed for moderate pain (pain score 4-6) or severe pain (pain score 7-10).     traZODone  (DESYREL ) 100 MG tablet TAKE 1 TABLET BY MOUTH AT BEDTIME 90 tablet 1   No current facility-administered medications for this visit.    No Known Allergies   REVIEW OF SYSTEMS:  *** [X]  denotes positive finding, [ ]  denotes negative finding Cardiac  Comments:  Chest pain or chest pressure:    Shortness of breath upon exertion:    Short of breath when lying flat:    Irregular heart rhythm:        Vascular    Pain in calf, thigh, or hip brought on by  ambulation:    Pain in feet at night that wakes you up from your sleep:     Blood clot in your veins:    Leg swelling:         Pulmonary    Oxygen at home:    Productive cough:  Wheezing:         Neurologic    Sudden weakness in arms or legs:     Sudden numbness in arms or legs:     Sudden onset of difficulty speaking or slurred speech:    Temporary loss of vision in one eye:     Problems with dizziness:         Gastrointestinal    Blood in stool:     Vomited blood:         Genitourinary    Burning when urinating:     Blood in urine:        Psychiatric    Major depression:         Hematologic    Bleeding problems:    Problems with blood clotting too easily:        Skin    Rashes or ulcers:        Constitutional    Fever or chills:      PHYSICAL EXAMINATION:  There were no vitals filed for this visit.  General:  WDWN in NAD; vital signs documented above Gait: Not observed HENT: WNL, normocephalic Pulmonary: normal non-labored breathing , without wheezing Cardiac: {Desc; regular/irreg:14544} HR Abdomen: soft, NT, no masses Skin: {With/Without:20273} rashes Vascular Exam/Pulses:  Right Left  Radial {Exam; arterial pulse strength 0-4:30167} {Exam; arterial pulse strength 0-4:30167}  Ulnar {Exam; arterial pulse strength 0-4:30167} {Exam; arterial pulse strength 0-4:30167}  Femoral {Exam; arterial pulse strength 0-4:30167} {Exam; arterial pulse strength 0-4:30167}  Popliteal {Exam; arterial pulse strength 0-4:30167} {Exam; arterial pulse strength 0-4:30167}  DP {Exam; arterial pulse strength 0-4:30167} {Exam; arterial pulse strength 0-4:30167}  PT {Exam; arterial pulse strength 0-4:30167} {Exam; arterial pulse strength 0-4:30167}   Extremities: {With/Without:20273} ischemic changes, {With/Without:20273} Gangrene , {With/Without:20273} cellulitis; {With/Without:20273} open wounds;  Musculoskeletal: no muscle wasting or atrophy  Neurologic: A&O X 3;  No focal  weakness or paresthesias are detected Psychiatric:  The pt has {Desc; normal/abnormal:11317::Normal} affect.   Non-Invasive Vascular Imaging:    Right Carotid Findings:  +----------+--------+--------+--------+------------+------------------+           PSV cm/sEDV cm/sStenosisDescribe    Comments            +----------+--------+--------+--------+------------+------------------+  CCA Prox  81      18                          intimal thickening  +----------+--------+--------+--------+------------+------------------+  CCA Distal93      21                          intimal thickening  +----------+--------+--------+--------+------------+------------------+  ICA Prox  95      21              heterogenous                    +----------+--------+--------+--------+------------+------------------+  ICA Mid   122     33                                              +----------+--------+--------+--------+------------+------------------+  ICA Distal79      25                                              +----------+--------+--------+--------+------------+------------------+  ECA      134     22                                              +----------+--------+--------+--------+------------+------------------+   +----------+--------+-------+--------+------------+           PSV cm/sEDV cmsDescribeArm Pressure  +----------+--------+-------+--------+------------+  Subclavian147                   113           +----------+--------+-------+--------+------------+   +---------+--------+--------+----------+  VertebralPSV cm/sEDV cm/sRetrograde  +---------+--------+--------+----------+   Left Carotid Findings:  +----------+--------+--------+--------+------------+------------------+           PSV cm/sEDV cm/sStenosisDescribe    Comments            +----------+--------+--------+--------+------------+------------------+  CCA Prox   88      16                          intimal thickening  +----------+--------+--------+--------+------------+------------------+  CCA Distal124     21              heterogenous                    +----------+--------+--------+--------+------------+------------------+  ICA Prox  424     169     80-99%  calcific                        +----------+--------+--------+--------+------------+------------------+  ICA Mid   167     47                                              +----------+--------+--------+--------+------------+------------------+  ICA Distal62      18                                              +----------+--------+--------+--------+------------+------------------+  ECA      83      12                                              +----------+--------+--------+--------+------------+------------------+     ASSESSMENT/PLAN: AERIK POLAN is a 68 y.o. male presenting with ***   ***   Fonda FORBES Rim, MD [facility] [phone]

## 2024-06-22 ENCOUNTER — Ambulatory Visit: Attending: Vascular Surgery | Admitting: Vascular Surgery

## 2024-06-22 ENCOUNTER — Encounter: Payer: Self-pay | Admitting: Vascular Surgery

## 2024-06-22 VITALS — BP 129/78 | HR 61 | Temp 98.2°F | Resp 18 | Ht 68.0 in | Wt 172.4 lb

## 2024-06-22 DIAGNOSIS — I6522 Occlusion and stenosis of left carotid artery: Secondary | ICD-10-CM

## 2024-06-23 ENCOUNTER — Other Ambulatory Visit: Payer: Self-pay | Admitting: Adult Health

## 2024-06-23 DIAGNOSIS — E118 Type 2 diabetes mellitus with unspecified complications: Secondary | ICD-10-CM

## 2024-06-23 NOTE — Telephone Encounter (Signed)
  The original prescription was discontinued on 05/28/2024 by Roddenberry, Myron G, PA-C for the following reason: Stop Taking at Discharge. Renewing this prescription may not be appropriate.

## 2024-06-26 ENCOUNTER — Other Ambulatory Visit: Payer: Self-pay

## 2024-06-26 DIAGNOSIS — I6522 Occlusion and stenosis of left carotid artery: Secondary | ICD-10-CM

## 2024-07-01 ENCOUNTER — Ambulatory Visit (HOSPITAL_BASED_OUTPATIENT_CLINIC_OR_DEPARTMENT_OTHER)

## 2024-07-04 NOTE — Telephone Encounter (Signed)
 Left voicemail requesting health insurance details for cardiac rehab orientation on 07/18/24.

## 2024-07-06 ENCOUNTER — Observation Stay: Admitting: Vascular Surgery

## 2024-07-11 ENCOUNTER — Ambulatory Visit: Admitting: Adult Health

## 2024-07-11 VITALS — BP 120/72 | HR 66 | Temp 98.5°F | Ht 68.0 in | Wt 174.6 lb

## 2024-07-11 DIAGNOSIS — F419 Anxiety disorder, unspecified: Secondary | ICD-10-CM | POA: Diagnosis not present

## 2024-07-11 DIAGNOSIS — F32A Depression, unspecified: Secondary | ICD-10-CM | POA: Diagnosis not present

## 2024-07-11 NOTE — Progress Notes (Signed)
 Subjective:    Patient ID: Matthew Patterson, male    DOB: Nov 01, 1955, 68 y.o.   MRN: [id number]  HPI  68 year old male who  has a past medical history of Anxiety and depression, Carotid artery occlusion, Diabetes mellitus without complication (HCC), GERD (gastroesophageal reflux disease), Hyperlipidemia, Melanoma (HCC) (2023), and Myocardial infarction (HCC) (05-17-24).  He presents to the office today for one month follow up regarding anxiety and depression. During has last visit he reported that  since his open heart surgery that he has been experiencing mood swings, depression, anxiety and panic attacks. Reports that he will be relaxing at home and start crying for no reason, feel angry or go into a full blown panic attack. He wanted  to start medication for this so he was placed on Zoloft  25 mg daily   Today he reports that he has not had any side effects of the medication and is feeling better. He no longer has any anger, is no longer having crying spells and his anxiety/panic attacks has decreased significantly, as he will have one weekly, usually at night.     Review of Systems See HPI   Past Medical History:  Diagnosis Date   Anxiety and depression    Carotid artery occlusion    Diabetes mellitus without complication (HCC)    GERD (gastroesophageal reflux disease)    Hyperlipidemia    Melanoma (HCC) 2023   back   Myocardial infarction [facility]) 05-17-24    Social History   Socioeconomic History   Marital status: Married    Spouse name: Not on file   Number of children: Not on file   Years of education: Not on file   Highest education level: Associate degree: academic program  Occupational History   Not on file  Tobacco Use   Smoking status: Former    Types: Cigarettes, Cigars    Start date: 2010   Smokeless tobacco: Never  Vaping Use   Vaping status: Never Used  Substance and Sexual Activity   Alcohol use: Yes    Alcohol/week: 1.0 - 2.0 standard drink of alcohol     Types: 1 - 2 Cans of beer per week   Drug use: No   Sexual activity: Not on file  Other Topics Concern   Not on file  Social History Narrative   Senior buyer for manufacturing    Married   One children    One grandchild      He likes to play golf    Social Drivers of Health   Financial Resource Strain: Low Risk  (07/11/2024)   Overall Financial Resource Strain (CARDIA)    Difficulty of Paying Living Expenses: Not hard at all  Food Insecurity: No Food Insecurity (07/11/2024)   Hunger Vital Sign    Worried About Running Out of Food in the Last Year: Never true    Ran Out of Food in the Last Year: Never true  Transportation Needs: No Transportation Needs (07/11/2024)   PRAPARE - Administrator, Civil Service (Medical): No    Lack of Transportation (Non-Medical): No  Physical Activity: Insufficiently Active (07/11/2024)   Exercise Vital Sign    Days of Exercise per Week: 2 days    Minutes of Exercise per Session: 20 min  Stress: No Stress Concern Present (07/11/2024)   [employer] of Occupational Health - Occupational Stress Questionnaire    Feeling of Stress: Only a little  Social Connections: Socially Integrated (07/11/2024)   Social  Connection and Isolation Panel    Frequency of Communication with Friends and Family: Twice a week    Frequency of Social Gatherings with Friends and Family: Once a week    Attends Religious Services: 1 to 4 times per year    Active Member of [employer] or Organizations: Yes    Attends Engineer, Structural: More than 4 times per year    Marital Status: Married  Catering Manager Violence: Not At Risk (06/07/2024)   Humiliation, Afraid, Rape, and Kick questionnaire    Fear of Current or Ex-Partner: No    Emotionally Abused: No    Physically Abused: No    Sexually Abused: No    Past Surgical History:  Procedure Laterality Date   CORONARY ARTERY BYPASS GRAFT N/A 05/23/2024   Procedure: CORONARY ARTERY BYPASS GRAFTING (CABG)  X THREE, USING LEFT INTERNAL MAMMARY ARTERY AND RIGHT LEG GREATER SAPHENOUS VEIN HARVESTED ENDOSCOPICALLY;  Surgeon: Shyrl Linnie KIDD, MD;  Location: [facility] OR;  Service: Open Heart Surgery;  Laterality: N/A;   RIGHT/LEFT HEART CATH AND CORONARY ANGIOGRAPHY N/A 05/19/2024   Procedure: RIGHT/LEFT HEART CATH AND CORONARY ANGIOGRAPHY;  Surgeon: Jordan, Peter M, MD;  Location: [facility] INVASIVE CV LAB;  Service: Cardiovascular;  Laterality: N/A;   ROTATOR CUFF REPAIR     SHOULDER SURGERY  2010   B/L    Family History  Problem Relation Age of Onset   Early death Father    Early death Maternal Grandfather    Diabetes Mother    Hypertension Mother    Heart disease Mother    Cancer Sister        uterine cancer   Cancer Sister    Cancer Sister    Cancer Sister     No Known Allergies  Current Outpatient Medications on File Prior to Visit  Medication Sig Dispense Refill   acetaminophen  (TYLENOL ) 325 MG tablet Take 2 tablets (650 mg total) by mouth every 6 (six) hours as needed. (Patient taking differently: Take 650 mg by mouth daily as needed for mild pain (pain score 1-3) or moderate pain (pain score 4-6).)     aspirin  EC 325 MG tablet Take 1 tablet (325 mg total) by mouth daily. 100 tablet 3   Continuous Glucose Sensor (FREESTYLE LIBRE 3 PLUS SENSOR) MISC 1 Device by Other route every 14 (fourteen) days. Change sensor every 15 days. 6 each 3   ezetimibe  (ZETIA ) 10 MG tablet Take 1 tablet (10 mg total) by mouth daily. 90 tablet 3   fenofibrate  (TRICOR ) 145 MG tablet TAKE 1 TABLET BY MOUTH DAILY 90 tablet 3   glucose blood test strip Use as instructed 100 each 12   Lancets (ONETOUCH ULTRASOFT) lancets Use as instructed 100 each 12   metFORMIN  (GLUCOPHAGE -XR) 750 MG 24 hr tablet Take 1 tablet (750 mg total) by mouth daily with breakfast. 90 tablet 3   rosuvastatin  (CRESTOR ) 40 MG tablet Take 1 tablet (40 mg total) by mouth daily. 90 tablet 3   sertraline  (ZOLOFT ) 25 MG tablet Take 1 tablet (25 mg  total) by mouth daily. 30 tablet 3   traMADol  (ULTRAM ) 50 MG tablet Take 50 mg by mouth at bedtime as needed for moderate pain (pain score 4-6) or severe pain (pain score 7-10).     traZODone  (DESYREL ) 100 MG tablet TAKE 1 TABLET BY MOUTH AT BEDTIME 90 tablet 1   No current facility-administered medications on file prior to visit.    BP 120/72 (BP Location: Left Arm, Patient Position:  Sitting, Cuff Size: Large)   Pulse 66   Temp 98.5 F (36.9 C) (Oral)   Ht 5' 8 (1.727 m)   Wt 174 lb 9.6 oz (79.2 kg)   SpO2 97%   BMI 26.55 kg/m       Objective:   Physical Exam Vitals and nursing note reviewed.  Constitutional:      Appearance: Normal appearance.  Cardiovascular:     Rate and Rhythm: Normal rate and regular rhythm.     Pulses: Normal pulses.     Heart sounds: Normal heart sounds.  Pulmonary:     Effort: Pulmonary effort is normal.     Breath sounds: Normal breath sounds.  Skin:    General: Skin is warm and dry.  Neurological:     General: No focal deficit present.     Mental Status: He is alert and oriented to person, place, and time.  Psychiatric:        Mood and Affect: Mood normal.        Behavior: Behavior normal.        Thought Content: Thought content normal.        Judgment: Judgment normal.        Assessment & Plan:  1. Anxiety and depression (Primary) - We discussed increasing her dose but he would like to stay at the 25 mg dose for the next few weeks. He will send me a mychart message if he wants to increase his medication   Darleene Shape, NP

## 2024-07-17 ENCOUNTER — Ambulatory Visit (HOSPITAL_BASED_OUTPATIENT_CLINIC_OR_DEPARTMENT_OTHER)

## 2024-07-18 ENCOUNTER — Telehealth: Payer: Self-pay

## 2024-07-18 ENCOUNTER — Ambulatory Visit (HOSPITAL_BASED_OUTPATIENT_CLINIC_OR_DEPARTMENT_OTHER): Admission: RE | Admit: 2024-07-18 | Discharge: 2024-07-18 | Attending: Vascular Surgery

## 2024-07-18 ENCOUNTER — Ambulatory Visit (HOSPITAL_BASED_OUTPATIENT_CLINIC_OR_DEPARTMENT_OTHER)

## 2024-07-18 ENCOUNTER — Encounter (HOSPITAL_BASED_OUTPATIENT_CLINIC_OR_DEPARTMENT_OTHER): Payer: Self-pay

## 2024-07-18 DIAGNOSIS — I6522 Occlusion and stenosis of left carotid artery: Secondary | ICD-10-CM

## 2024-07-18 MED ORDER — IOHEXOL 350 MG/ML SOLN: 100.0000 mL | Freq: Once | INTRAVENOUS | Status: DC | PRN

## 2024-07-18 MED ORDER — IOHEXOL 350 MG/ML SOLN
100.0000 mL | Freq: Once | INTRAVENOUS | Status: AC | PRN
  Administered 2024-07-18: 13:00:00 75 mL via INTRAVENOUS

## 2024-07-18 NOTE — Telephone Encounter (Signed)
 Spoke to pt regarding his short term disability form. He is aware we will proceed with filling this out once he has surgery date. He is scheduled later this week with MD.

## 2024-07-19 NOTE — Progress Notes (Unsigned)
 Office Note     CC:  Carotid stenosis Requesting Provider:  Merna Huxley, NP  HPI: Matthew Patterson is a 68 y.o. (1955/10/31) male presenting at the request of .Nafziger, Huxley, NP for left carotid artery stenosis appreciated prior to CABG.  Patient underwent two-vessel CABG by Dr. Shyrl on 05/23/2024.  Preoperative imaging demonstrated critical stenosis of the left ICA.  There also appear to be right sided vertebral artery retrograde flow.  On exam, Legion was doing well.  I followed his wife in the outpatient setting for FMD.  It was nice seeing her today as well. Jep has been doing well since surgery, and feels like he is getting his strength back.  He has not been completely cleared by cardiac surgery, and continues to have been restrictions due to sternotomy.  Olaoluwa denies history of TIA, stroke, amaurosis.  The pt is  on a statin for cholesterol management.  The pt is  on a daily aspirin .   Other AC:  -  Past Medical History:  Diagnosis Date   Anxiety and depression    Carotid artery occlusion    Diabetes mellitus without complication (HCC)    GERD (gastroesophageal reflux disease)    Hyperlipidemia    Melanoma (HCC) 2023   back   Myocardial infarction [facility]) 05-17-24    Past Surgical History:  Procedure Laterality Date   CORONARY ARTERY BYPASS GRAFT N/A 05/23/2024   Procedure: CORONARY ARTERY BYPASS GRAFTING (CABG) X THREE, USING LEFT INTERNAL MAMMARY ARTERY AND RIGHT LEG GREATER SAPHENOUS VEIN HARVESTED ENDOSCOPICALLY;  Surgeon: Shyrl Linnie KIDD, MD;  Location: [facility] OR;  Service: Open Heart Surgery;  Laterality: N/A;   RIGHT/LEFT HEART CATH AND CORONARY ANGIOGRAPHY N/A 05/19/2024   Procedure: RIGHT/LEFT HEART CATH AND CORONARY ANGIOGRAPHY;  Surgeon: Jordan, Peter M, MD;  Location: [facility] INVASIVE CV LAB;  Service: Cardiovascular;  Laterality: N/A;   ROTATOR CUFF REPAIR     SHOULDER SURGERY  2010   B/L    Social History   Socioeconomic History   Marital status:  Married    Spouse name: Not on file   Number of children: Not on file   Years of education: Not on file   Highest education level: Associate degree: academic program  Occupational History   Not on file  Tobacco Use   Smoking status: Former    Types: Cigarettes, Cigars    Start date: 2010   Smokeless tobacco: Never  Vaping Use   Vaping status: Never Used  Substance and Sexual Activity   Alcohol use: Yes    Alcohol/week: 1.0 - 2.0 standard drink of alcohol    Types: 1 - 2 Cans of beer per week   Drug use: No   Sexual activity: Not on file  Other Topics Concern   Not on file  Social History Narrative   Senior buyer for manufacturing    Married   One children    One grandchild      He likes to play golf    Social Drivers of Health   Tobacco Use: Medium Risk (07/11/2024)   Patient History    Smoking Tobacco Use: Former    Smokeless Tobacco Use: Never    Passive Exposure: Not on Actuary Strain: Low Risk (07/11/2024)   Overall Financial Resource Strain (CARDIA)    Difficulty of Paying Living Expenses: Not hard at all  Food Insecurity: No Food Insecurity (07/11/2024)   Epic    Worried About Radiation Protection Practitioner of Food in the  Last Year: Never true    Ran Out of Food in the Last Year: Never true  Transportation Needs: No Transportation Needs (07/11/2024)   Epic    Lack of Transportation (Medical): No    Lack of Transportation (Non-Medical): No  Physical Activity: Insufficiently Active (07/11/2024)   Exercise Vital Sign    Days of Exercise per Week: 2 days    Minutes of Exercise per Session: 20 min  Stress: No Stress Concern Present (07/11/2024)   [employer] of Occupational Health - Occupational Stress Questionnaire    Feeling of Stress: Only a little  Social Connections: Socially Integrated (07/11/2024)   Social Connection and Isolation Panel    Frequency of Communication with Friends and Family: Twice a week    Frequency of Social Gatherings with Friends and  Family: Once a week    Attends Religious Services: 1 to 4 times per year    Active Member of [employer] or Organizations: Yes    Attends Banker Meetings: More than 4 times per year    Marital Status: Married  Catering Manager Violence: Not At Risk (06/07/2024)   Epic    Fear of Current or Ex-Partner: No    Emotionally Abused: No    Physically Abused: No    Sexually Abused: No  Depression (PHQ2-9): Low Risk (05/05/2024)   Depression (PHQ2-9)    PHQ-2 Score: 0  Alcohol Screen: Low Risk (07/11/2024)   Alcohol Screen    Last Alcohol Screening Score (AUDIT): 1  Housing: Low Risk (07/11/2024)   Epic    Unable to Pay for Housing in the Last Year: No    Number of Times Moved in the Last Year: 0    Homeless in the Last Year: No  Utilities: Not At Risk (06/07/2024)   Epic    Threatened with loss of utilities: No  Health Literacy: Not on file   Family History  Problem Relation Age of Onset   Early death Father    Early death Maternal Grandfather    Diabetes Mother    Hypertension Mother    Heart disease Mother    Cancer Sister        uterine cancer   Cancer Sister    Cancer Sister    Cancer Sister     Current Outpatient Medications  Medication Sig Dispense Refill   acetaminophen  (TYLENOL ) 325 MG tablet Take 2 tablets (650 mg total) by mouth every 6 (six) hours as needed. (Patient taking differently: Take 650 mg by mouth daily as needed for mild pain (pain score 1-3) or moderate pain (pain score 4-6).)     aspirin  EC 325 MG tablet Take 1 tablet (325 mg total) by mouth daily. 100 tablet 3   Continuous Glucose Sensor (FREESTYLE LIBRE 3 PLUS SENSOR) MISC 1 Device by Other route every 14 (fourteen) days. Change sensor every 15 days. 6 each 3   ezetimibe  (ZETIA ) 10 MG tablet Take 1 tablet (10 mg total) by mouth daily. 90 tablet 3   fenofibrate  (TRICOR ) 145 MG tablet TAKE 1 TABLET BY MOUTH DAILY 90 tablet 3   glucose blood test strip Use as instructed 100 each 12   Lancets  (ONETOUCH ULTRASOFT) lancets Use as instructed 100 each 12   metFORMIN  (GLUCOPHAGE -XR) 750 MG 24 hr tablet Take 1 tablet (750 mg total) by mouth daily with breakfast. 90 tablet 3   rosuvastatin  (CRESTOR ) 40 MG tablet Take 1 tablet (40 mg total) by mouth daily. 90 tablet 3   sertraline  (ZOLOFT )  25 MG tablet Take 1 tablet (25 mg total) by mouth daily. 30 tablet 3   traMADol  (ULTRAM ) 50 MG tablet Take 50 mg by mouth at bedtime as needed for moderate pain (pain score 4-6) or severe pain (pain score 7-10).     traZODone  (DESYREL ) 100 MG tablet TAKE 1 TABLET BY MOUTH AT BEDTIME 90 tablet 1   No current facility-administered medications for this visit.    No Known Allergies   REVIEW OF SYSTEMS:  [X]  denotes positive finding, [ ]  denotes negative finding Cardiac  Comments:  Chest pain or chest pressure:    Shortness of breath upon exertion:    Short of breath when lying flat:    Irregular heart rhythm:        Vascular    Pain in calf, thigh, or hip brought on by ambulation:    Pain in feet at night that wakes you up from your sleep:     Blood clot in your veins:    Leg swelling:         Pulmonary    Oxygen at home:    Productive cough:     Wheezing:         Neurologic    Sudden weakness in arms or legs:     Sudden numbness in arms or legs:     Sudden onset of difficulty speaking or slurred speech:    Temporary loss of vision in one eye:     Problems with dizziness:         Gastrointestinal    Blood in stool:     Vomited blood:         Genitourinary    Burning when urinating:     Blood in urine:        Psychiatric    Major depression:         Hematologic    Bleeding problems:    Problems with blood clotting too easily:        Skin    Rashes or ulcers:        Constitutional    Fever or chills:      PHYSICAL EXAMINATION:  There were no vitals filed for this visit.  General:  WDWN in NAD; vital signs documented above Gait: Not observed HENT: WNL,  normocephalic Pulmonary: normal non-labored breathing , without wheezing Cardiac: regular HR Abdomen: soft, NT, no masses Skin: without rashes Vascular Exam/Pulses:  Right Left  Radial 2+ (normal) 2+ (normal)  Ulnar    Femoral    Popliteal    DP 2+ (normal) 2+ (normal)  PT     Extremities: without ischemic changes, without Gangrene , without cellulitis; without open wounds;  Musculoskeletal: no muscle wasting or atrophy  Neurologic: A&O X 3;  No focal weakness or paresthesias are detected Psychiatric:  The pt has Normal affect.   Non-Invasive Vascular Imaging:    Right Carotid Findings:  +----------+--------+--------+--------+------------+------------------+           PSV cm/sEDV cm/sStenosisDescribe    Comments            +----------+--------+--------+--------+------------+------------------+  CCA Prox  81      18                          intimal thickening  +----------+--------+--------+--------+------------+------------------+  CCA Distal93      21  intimal thickening  +----------+--------+--------+--------+------------+------------------+  ICA Prox  95      21              heterogenous                    +----------+--------+--------+--------+------------+------------------+  ICA Mid   122     33                                              +----------+--------+--------+--------+------------+------------------+  ICA Distal79      25                                              +----------+--------+--------+--------+------------+------------------+  ECA      134     22                                              +----------+--------+--------+--------+------------+------------------+   +----------+--------+-------+--------+------------+           PSV cm/sEDV cmsDescribeArm Pressure  +----------+--------+-------+--------+------------+  [id number]                   113            +----------+--------+-------+--------+------------+   +---------+--------+--------+----------+  VertebralPSV cm/sEDV cm/sRetrograde  +---------+--------+--------+----------+   Left Carotid Findings:  +----------+--------+--------+--------+------------+------------------+           PSV cm/sEDV cm/sStenosisDescribe    Comments            +----------+--------+--------+--------+------------+------------------+  CCA Prox  88      16                          intimal thickening  +----------+--------+--------+--------+------------+------------------+  CCA Distal124     21              heterogenous                    +----------+--------+--------+--------+------------+------------------+  ICA Prox  424     169     80-99%  calcific                        +----------+--------+--------+--------+------------+------------------+  ICA Mid   167     47                                              +----------+--------+--------+--------+------------+------------------+  ICA Distal62      18                                              +----------+--------+--------+--------+------------+------------------+  ECA      83      12                                              +----------+--------+--------+--------+------------+------------------+  ASSESSMENT/PLAN: FRAZER RAINVILLE is a 68 y.o. male presenting with asymptomatic left ICA stenosis.  He is now 1 month status post CABG.  We had a nice conversation regarding cerebrovascular disease and the natural history.  He is aware that with critical ICA stenosis, he has an 11% risk of stroke over the next 5 years.  I think it is reasonable to treat his left-sided stenosis.  He would benefit from CT angio head and neck in an effort to further define the lesion.  This will allow me to determine surgical candidacy-CEA versus TCAR.  CTA will also help to define if there is right subclavian artery stenosis  leading to retrograde vertebral artery flow.  My plan is to see him back in clinic in the coming weeks once the CT angio has been completed. I also plan to reach out regarding clearance to Dr. Shyrl, and his cardiologist Dr. Azobou   Bryton Romagnoli E Dinesh Ulysse, MD [facility] [phone]

## 2024-07-20 ENCOUNTER — Encounter: Payer: Self-pay | Admitting: Vascular Surgery

## 2024-07-20 ENCOUNTER — Ambulatory Visit (HOSPITAL_COMMUNITY)
Admission: RE | Admit: 2024-07-20 | Discharge: 2024-07-20 | Attending: Cardiovascular Disease | Admitting: Cardiovascular Disease

## 2024-07-20 ENCOUNTER — Ambulatory Visit: Payer: Self-pay

## 2024-07-20 ENCOUNTER — Other Ambulatory Visit: Payer: Self-pay

## 2024-07-20 ENCOUNTER — Ambulatory Visit: Admitting: Vascular Surgery

## 2024-07-20 VITALS — BP 129/80 | HR 60 | Temp 98.1°F | Resp 18 | Ht 68.0 in | Wt 175.3 lb

## 2024-07-20 DIAGNOSIS — I2510 Atherosclerotic heart disease of native coronary artery without angina pectoris: Secondary | ICD-10-CM

## 2024-07-20 DIAGNOSIS — I6522 Occlusion and stenosis of left carotid artery: Secondary | ICD-10-CM

## 2024-07-20 LAB — ECHOCARDIOGRAM COMPLETE
AR max vel: 1.75 cm2
AV Area VTI: 1.86 cm2
AV Area mean vel: 1.71 cm2
AV Mean grad: 5 mmHg
AV Peak grad: 9.1 mmHg
Ao pk vel: 1.51 m/s
Area-P 1/2: 3.42 cm2
S' Lateral: 2.65 cm

## 2024-07-20 MED ORDER — CLOPIDOGREL BISULFATE 75 MG PO TABS: 75.0000 mg | ORAL_TABLET | Freq: Every day | ORAL | 6 refills | Status: AC

## 2024-07-21 ENCOUNTER — Telehealth: Payer: Self-pay | Admitting: *Deleted

## 2024-07-21 ENCOUNTER — Ambulatory Visit

## 2024-07-21 ENCOUNTER — Ambulatory Visit: Payer: Self-pay

## 2024-07-21 DIAGNOSIS — I4891 Unspecified atrial fibrillation: Secondary | ICD-10-CM

## 2024-07-21 DIAGNOSIS — I2510 Atherosclerotic heart disease of native coronary artery without angina pectoris: Secondary | ICD-10-CM | POA: Diagnosis not present

## 2024-07-21 DIAGNOSIS — Z951 Presence of aortocoronary bypass graft: Secondary | ICD-10-CM | POA: Diagnosis not present

## 2024-07-21 NOTE — Telephone Encounter (Signed)
 Confirmed with pt . Insurance company will be changing at the 1st of the year. Will call back for card information

## 2024-07-22 ENCOUNTER — Other Ambulatory Visit: Payer: Self-pay | Admitting: Internal Medicine

## 2024-08-08 NOTE — Progress Notes (Signed)
 Surgical Instructions   Your procedure is scheduled on Monday, January 12th, 2026. Report to [facility] Main Entrance A at 5:30 A.M., then check in with the Admitting office. Any questions or running late day of surgery: call [phone]  Questions prior to your surgery date: call [phone], Monday-Friday, 8am-4pm. If you experience any cold or flu symptoms such as cough, fever, chills, shortness of breath, etc. between now and your scheduled surgery, please notify us  at the above number.     Remember:  Do not eat or drink after midnight the night before your surgery     Take these medicines the morning of surgery with A SIP OF WATER : Aspirin  Clopidogrel  (Plavix ) Ezetimibe  (Zetia ) Fenofibrate  (Tricor ) Rosuvastatin  (Crestor ) Sertraline  (Zoloft )   May take these medicines IF NEEDED: Acetaminophen  (Tylenol )    One week prior to surgery, STOP taking any Aleve, Naproxen, Ibuprofen, Motrin, Advil, Goody's, BC's, all herbal medications, fish oil, and non-prescription vitamins.    WHAT DO I DO ABOUT MY DIABETES MEDICATION?   Do not take Metformin  (Glucophage ) on the morning of surgery.    HOW TO MANAGE YOUR DIABETES BEFORE AND AFTER SURGERY  Why is it important to control my blood sugar before and after surgery? Improving blood sugar levels before and after surgery helps healing and can limit problems. A way of improving blood sugar control is eating a healthy diet by:  Eating less sugar and carbohydrates  Increasing activity/exercise  Talking with your doctor about reaching your blood sugar goals High blood sugars (greater than 180 mg/dL) can raise your risk of infections and slow your recovery, so you will need to focus on controlling your diabetes during the weeks before surgery. Make sure that the doctor who takes care of your diabetes knows about your planned surgery including the date and location.  How do I manage my blood sugar before surgery? Check your  blood sugar at least 4 times a day, starting 2 days before surgery, to make sure that the level is not too high or low.  Check your blood sugar the morning of your surgery when you wake up and every 2 hours until you get to the Short Stay unit.  If your blood sugar is less than 70 mg/dL, you will need to treat for low blood sugar: Do not take insulin . Treat a low blood sugar (less than 70 mg/dL) with  cup of clear juice (cranberry or apple), 4 glucose tablets, OR glucose gel. Recheck blood sugar in 15 minutes after treatment (to make sure it is greater than 70 mg/dL). If your blood sugar is not greater than 70 mg/dL on recheck, call [phone] for further instructions. Report your blood sugar to the short stay nurse when you get to Short Stay.  If you are admitted to the hospital after surgery: Your blood sugar will be checked by the staff and you will probably be given insulin  after surgery (instead of oral diabetes medicines) to make sure you have good blood sugar levels. The goal for blood sugar control after surgery is 80-180 mg/dL.                      Do NOT Smoke (Tobacco/Vaping) for 24 hours prior to your procedure.  If you use a CPAP at night, you may bring your mask/headgear for your overnight stay.   You will be asked to remove any contacts, glasses, piercing's, hearing aid's, dentures/partials prior to surgery. Please bring cases for these items if needed.  Patients discharged the day of surgery will not be allowed to drive home, and someone needs to stay with them for 24 hours.  SURGICAL WAITING ROOM VISITATION Patients may have no more than 2 support people in the waiting area - these visitors may rotate.   Pre-op nurse will coordinate an appropriate time for 1 ADULT support person, who may not rotate, to accompany patient in pre-op.  Children under the age of 21 must have an adult with them who is not the patient and must remain in the main waiting area with an  adult.  If the patient needs to stay at the hospital during part of their recovery, the visitor guidelines for inpatient rooms apply.  Please refer to the [facility] website for the visitor guidelines for any additional information.   If you received a COVID test during your pre-op visit  it is requested that you wear a mask when out in public, stay away from anyone that may not be feeling well and notify your surgeon if you develop symptoms. If you have been in contact with anyone that has tested positive in the last 10 days please notify you surgeon.      Pre-operative CHG Bathing Instructions   You can play a key role in reducing the risk of infection after surgery. Your skin needs to be as free of germs as possible. You can reduce the number of germs on your skin by washing with CHG (chlorhexidine  gluconate) soap before surgery. CHG is an antiseptic soap that kills germs and continues to kill germs even after washing.   DO NOT use if you have an allergy to chlorhexidine /CHG or antibacterial soaps. If your skin becomes reddened or irritated, stop using the CHG and notify one of our RNs at [phone].              TAKE A SHOWER THE NIGHT BEFORE SURGERY   Please keep in mind the following:  DO NOT shave, including legs and underarms, 48 hours prior to surgery.   You may shave your face before/day of surgery.  Place clean sheets on your bed the night before surgery Use a clean washcloth (not used since being washed) for shower. DO NOT sleep with pet's night before surgery.  CHG Shower Instructions:  Wash your face and private area with normal soap. If you choose to wash your hair, wash first with your normal shampoo.  After you use shampoo/soap, rinse your hair and body thoroughly to remove shampoo/soap residue.  Turn the water  OFF and apply half the bottle of CHG soap to a CLEAN washcloth.  Apply CHG soap ONLY FROM YOUR NECK DOWN TO YOUR TOES (washing for 3-5 minutes)  DO NOT use  CHG soap on face, private areas, open wounds, or sores.  Pay special attention to the area where your surgery is being performed.  If you are having back surgery, having someone wash your back for you may be helpful. Wait 2 minutes after CHG soap is applied, then you may rinse off the CHG soap.  Pat dry with a clean towel  Put on clean pajamas    Additional instructions for the day of surgery: If you choose, you may shower the morning of surgery with an antibacterial soap.  DO NOT APPLY any lotions, deodorants, cologne, or perfumes.   Do not wear jewelry or makeup Do not wear nail polish, gel polish, artificial nails, or any other type of covering on natural nails (fingers and toes) Do not  bring valuables to the hospital. [facility] is not responsible for valuables/personal belongings. Put on clean/comfortable clothes.  Please brush your teeth.  Ask your nurse before applying any prescription medications to the skin.

## 2024-08-09 ENCOUNTER — Encounter (HOSPITAL_COMMUNITY)
Admission: RE | Admit: 2024-08-09 | Discharge: 2024-08-09 | Disposition: A | Discharge status: Home / Self Care | Source: Ambulatory Visit | Attending: Vascular Surgery | Admitting: Vascular Surgery

## 2024-08-09 ENCOUNTER — Encounter (HOSPITAL_COMMUNITY): Payer: Self-pay

## 2024-08-09 ENCOUNTER — Other Ambulatory Visit: Payer: Self-pay

## 2024-08-09 VITALS — BP 138/85 | HR 56 | Temp 97.9°F | Resp 18 | Ht 69.0 in | Wt 171.8 lb

## 2024-08-09 DIAGNOSIS — Z85820 Personal history of malignant melanoma of skin: Secondary | ICD-10-CM | POA: Insufficient documentation

## 2024-08-09 DIAGNOSIS — Z7902 Long term (current) use of antithrombotics/antiplatelets: Secondary | ICD-10-CM | POA: Diagnosis not present

## 2024-08-09 DIAGNOSIS — Z7984 Long term (current) use of oral hypoglycemic drugs: Secondary | ICD-10-CM | POA: Diagnosis not present

## 2024-08-09 DIAGNOSIS — Z01812 Encounter for preprocedural laboratory examination: Secondary | ICD-10-CM | POA: Insufficient documentation

## 2024-08-09 DIAGNOSIS — I2510 Atherosclerotic heart disease of native coronary artery without angina pectoris: Secondary | ICD-10-CM | POA: Diagnosis not present

## 2024-08-09 DIAGNOSIS — Z7982 Long term (current) use of aspirin: Secondary | ICD-10-CM | POA: Diagnosis not present

## 2024-08-09 DIAGNOSIS — E119 Type 2 diabetes mellitus without complications: Secondary | ICD-10-CM | POA: Diagnosis not present

## 2024-08-09 DIAGNOSIS — I6522 Occlusion and stenosis of left carotid artery: Secondary | ICD-10-CM | POA: Diagnosis not present

## 2024-08-09 DIAGNOSIS — I10 Essential (primary) hypertension: Secondary | ICD-10-CM | POA: Insufficient documentation

## 2024-08-09 DIAGNOSIS — Z951 Presence of aortocoronary bypass graft: Secondary | ICD-10-CM | POA: Insufficient documentation

## 2024-08-09 DIAGNOSIS — Z01818 Encounter for other preprocedural examination: Secondary | ICD-10-CM

## 2024-08-09 DIAGNOSIS — E785 Hyperlipidemia, unspecified: Secondary | ICD-10-CM | POA: Insufficient documentation

## 2024-08-09 DIAGNOSIS — Z87891 Personal history of nicotine dependence: Secondary | ICD-10-CM | POA: Diagnosis not present

## 2024-08-09 LAB — CBC
HCT: 46.8 % (ref 39.0–52.0)
Hemoglobin: 15.2 g/dL (ref 13.0–17.0)
MCH: 31.3 pg (ref 26.0–34.0)
MCHC: 32.5 g/dL (ref 30.0–36.0)
MCV: 96.3 fL (ref 80.0–100.0)
Platelets: 185 K/uL (ref 150–400)
RBC: 4.86 MIL/uL (ref 4.22–5.81)
RDW: 12.7 % (ref 11.5–15.5)
WBC: 6 K/uL (ref 4.0–10.5)
nRBC: 0 % (ref 0.0–0.2)

## 2024-08-09 LAB — URINALYSIS, ROUTINE W REFLEX MICROSCOPIC
Bilirubin Urine: NEGATIVE
Glucose, UA: NEGATIVE mg/dL
Hgb urine dipstick: NEGATIVE
Ketones, ur: NEGATIVE mg/dL
Leukocytes,Ua: NEGATIVE
Nitrite: NEGATIVE
Protein, ur: NEGATIVE mg/dL
Specific Gravity, Urine: 1.026 (ref 1.005–1.030)
pH: 5 (ref 5.0–8.0)

## 2024-08-09 LAB — TYPE AND SCREEN
ABO/RH(D): O POS
Antibody Screen: NEGATIVE

## 2024-08-09 LAB — COMPREHENSIVE METABOLIC PANEL WITH GFR
ALT: 17 U/L (ref 0–44)
AST: 26 U/L (ref 15–41)
Albumin: 4.4 g/dL (ref 3.5–5.0)
Alkaline Phosphatase: 44 U/L (ref 38–126)
Anion gap: 10 (ref 5–15)
BUN: 25 mg/dL — ABNORMAL HIGH (ref 8–23)
CO2: 26 mmol/L (ref 22–32)
Calcium: 9.4 mg/dL (ref 8.9–10.3)
Chloride: 106 mmol/L (ref 98–111)
Creatinine, Ser: 1.37 mg/dL — ABNORMAL HIGH (ref 0.61–1.24)
GFR, Estimated: 56 mL/min — ABNORMAL LOW
Glucose, Bld: 105 mg/dL — ABNORMAL HIGH (ref 70–99)
Potassium: 3.8 mmol/L (ref 3.5–5.1)
Sodium: 141 mmol/L (ref 135–145)
Total Bilirubin: 0.5 mg/dL (ref 0.0–1.2)
Total Protein: 7.4 g/dL (ref 6.5–8.1)

## 2024-08-09 LAB — GLUCOSE, CAPILLARY: Glucose-Capillary: 116 mg/dL — ABNORMAL HIGH (ref 70–99)

## 2024-08-09 LAB — APTT: aPTT: 38 s — ABNORMAL HIGH (ref 24–36)

## 2024-08-09 LAB — SURGICAL PCR SCREEN
MRSA, PCR: NEGATIVE
Staphylococcus aureus: NEGATIVE

## 2024-08-09 LAB — HEMOGLOBIN A1C
Hgb A1c MFr Bld: 5.8 % — ABNORMAL HIGH (ref 4.8–5.6)
Mean Plasma Glucose: 119.76 mg/dL

## 2024-08-09 NOTE — Progress Notes (Signed)
 PCP - Darleene Merna PIETY Cardiologist - Ren Ny, Joelle DEL, MD  PPM/ICD - denies Device Orders -  Rep Notified -   Chest x-ray - 06/06/24 EKG - 06/07/24 Stress Test - denies ECHO - 07/20/24 Cardiac Cath - 05/19/24  Sleep Study - denies CPAP -   Fasting Blood Sugar - 90's Checks Blood Sugar -  pt has Freestyle Libre 3  Last dose of GLP1 agonist-  na GLP1 instructions:   Blood Thinner Instructions:per Dr. Lanis, continue Plavix . Aspirin  Instructions: per Dr. Lanis, continue aspirin .  ERAS Protcol -NPO PRE-SURGERY Ensure or G2-   COVID TEST- na   Anesthesia review: yes- carotid artery stenosis, Cabg 05/23/24, DM, HLD  Patient denies shortness of breath, fever, cough and chest pain at PAT appointment   All instructions explained to the patient, with a verbal understanding of the material. Patient agrees to go over the instructions while at home for a better understanding.  The opportunity to ask questions was provided.

## 2024-08-10 NOTE — Progress Notes (Signed)
 Anesthesia Chart Review:  Case: [id number] Date/Time: 08/14/24 0715   Procedure: TRANSCAROTID ARTERY REVASCULARIZATION (TCAR) (Left)   Anesthesia type: General   Diagnosis: Carotid stenosis, left [I65.22]   Pre-op diagnosis: L carotid stenosis   [facility] ROOM [facility] / [facility]   Surgeons: Lanis Fonda BRAVO, MD       DISCUSSION: Patient is a 69 year old male scheduled for the above procedure.  History includes former smoker, HTN, HLD, DM2, carotid artery disease, CAD (presented with unstable angina->s/p CABG: LIMA-LAD, RSVG to OM1 & OM2 05/23/2024), post-operative PAF (05/2024), bradycardia, GERD, melanoma excision (back, 2023).   Metoprolol  and amiodarone  discontinued after he required admission post-CABG on 06/06/2024 for orthostasis and bradycardia. He was doing well at 06/12/2024 cardiology follow-up visit. Updated echo ordered, and a monitor ordered to assess for recurrent afib. Monitor showed no afib. 07/20/2024 TTE showed LVEF 60-65%, no RWMA, mild concentric LVH, grade 1 DD, normal RV systolic function, mild RVH, trivial MR.    A1c is 5.8%. He is on metformin  and has a [employer] 3 CGM.  Per Dr. Lanis he is to continue Plavix  and aspirin .  Anesthesia team to evaluate on the day of surgery.   VS: BP 138/85   Pulse (!) 56   Temp 36.6 C   Resp 18   Ht 5' 9 (1.753 m)   Wt 77.9 kg   SpO2 99%   BMI 25.37 kg/m    PROVIDERS: Merna Huxley, NP is PCP Ren Donley Prader, MD is cardiologist Shyrl Bovina, MD is CT surgeon Lanis Fonda, MD is vascular surgeon Sam, Donell, MD is endocrinologist   LABS: Preoperative labs noted. Cr 1.37, previously 1.33 on 06/08/2024.  PTT 38. Last PT/INR 17.0/1.3 (post-CABG), but were 14.3/1.1 pre-CABG.  (all labs ordered are listed, but only abnormal results are displayed)  Labs Reviewed  GLUCOSE, CAPILLARY - Abnormal; Notable for the following components:      Result Value   Glucose-Capillary 116 (*)    All other  components within normal limits  COMPREHENSIVE METABOLIC PANEL WITH GFR - Abnormal; Notable for the following components:   Glucose, Bld 105 (*)    BUN 25 (*)    Creatinine, Ser 1.37 (*)    GFR, Estimated 56 (*)    All other components within normal limits  APTT - Abnormal; Notable for the following components:   aPTT 38 (*)    All other components within normal limits  HEMOGLOBIN A1C - Abnormal; Notable for the following components:   Hgb A1c MFr Bld 5.8 (*)    All other components within normal limits  SURGICAL PCR SCREEN  CBC  URINALYSIS, ROUTINE W REFLEX MICROSCOPIC  TYPE AND SCREEN    Spirometry 05/22/2024:  Latest Reference Range & Units 05/22/24 08:21  FVC-Pre L 3.99  FVC-%Pred-Pre % 95  FEV1-Pre L 3.28  FEV1-%Pred-Pre % 106  Pre FEV1/FVC ratio % 82  FEV1FVC-%Pred-Pre % 111  FEF 25-75 Pre L/sec 3.20  FEF2575-%Pred-Pre % 133  FEV6-Pre L 3.99  FEV6-%Pred-Pre % 101  Pre FEV6/FVC Ratio % 100  FEV6FVC-%Pred-Pre % 105    IMAGES: CTA head/neck 07/18/2024: IMPRESSION: 1. Eccentric left mid common carotid artery. Severe focal stenosis at the origin of the left internal carotid artery, greater than 80% 2. No right carotid stenosis 3. Occluded right vertebral origin with normal left vertebral artery and basilar artery 4. Patent anterior communicating artery - Comparison US  Carotid 10/202/205: 1-39% RICA, 80-99% LICA, left VA antegrade flow, right VA retrograde flow.  CXR 06/06/2024:  IMPRESSION: 1. Small left pleural effusion. 2. Scarring in the left lung base. 3. Mildly enlarged heart.   EKG: 06/07/2024: Sinus bradycardia at 43 bpm Inferior infarct, old  Lateral leads are also involved  Baseline wander in lead(s) II, new T wave inversion lateral  Reconfirmed by McKenzie, Benton ([id number]) on 06/06/2024 4:11:02 P   CV: TTE 07/20/2024: IMPRESSIONS   1. Left ventricular ejection fraction, by estimation, is 60 to 65%. Left  ventricular ejection fraction by 3D  volume is 64 %. The left ventricle has  normal function. The left ventricle has no regional wall motion  abnormalities. There is mild concentric  left ventricular hypertrophy. Left ventricular diastolic parameters are  consistent with Grade I diastolic dysfunction (impaired relaxation).   2. Right ventricular systolic function is normal. The right ventricular  size is mildly enlarged.   3. Left atrial size was mildly dilated.   4. The mitral valve is normal in structure. Trivial mitral valve  regurgitation. No evidence of mitral stenosis.   5. The aortic valve is tricuspid. There is mild calcification of the  aortic valve. Aortic valve regurgitation is not visualized. Aortic valve  sclerosis/calcification is present, without any evidence of aortic  stenosis.   6. The inferior vena cava is normal in size with greater than 50%  respiratory variability, suggesting right atrial pressure of 3 mmHg.  - Comparison 05/19/2024: LVEF 60-65%, no RWMA, normal diastolic parameters, normal RV systolic function, cannot exclude small PFO.   Long term monitor 06/18/2024 - 07/17/2024: 26 days of data recorded on Zio monitor. Patient had a min HR of 50 bpm, max HR of 120 bpm, and avg HR of 61 bpm. Predominant underlying rhythm was Sinus Rhythm. No VT, SVT, atrial fibrillation, high degree block, or pauses noted. Isolated atrial and ventricular ectopy was rare (<1%). There were 10 triggered events. No significant arrhythmias detected.    LHC/RHC (PRE-CABG)05/19/2024:   Ost RCA to Prox RCA lesion is 100% stenosed.   Ost Cx to Prox Cx lesion is 90% stenosed.   1st Mrg lesion is 90% stenosed.   3rd Mrg lesion is 100% stenosed.   LPAV lesion is 100% stenosed with 100% stenosed side branch in LPDA.   Ost LAD lesion is 70% stenosed.   Prox LAD lesion is 90% stenosed.   Mid LAD lesion is 95% stenosed.   Mid LAD to Dist LAD lesion is 85% stenosed.   The left ventricular systolic function is normal.   LV end  diastolic pressure is normal.   The left ventricular ejection fraction is 55-65% by visual estimate.   Critical multivessel obstructive CAD Normal LV function Normal LV filling pressures. LVEDP 9 mm Hg, PCWP 11/9, mean 11 mm Hg Normal right heart pressures. PAP 30/10, mean 17 mm Hg Normal cardiac output. 5.31 L/min, index 2.74      Past Medical History:  Diagnosis Date   Anxiety and depression    Carotid artery occlusion    Diabetes mellitus without complication (HCC)    GERD (gastroesophageal reflux disease)    Hyperlipidemia    Melanoma (HCC) 2023   back   Myocardial infarction [facility]) 05-17-24    Past Surgical History:  Procedure Laterality Date   CORONARY ARTERY BYPASS GRAFT N/A 05/23/2024   Procedure: CORONARY ARTERY BYPASS GRAFTING (CABG) X THREE, USING LEFT INTERNAL MAMMARY ARTERY AND RIGHT LEG GREATER SAPHENOUS VEIN HARVESTED ENDOSCOPICALLY;  Surgeon: Shyrl Linnie KIDD, MD;  Location: [facility];  Service: Open Heart Surgery;  Laterality: N/A;  MELANOMA EXCISION  2023   back   RIGHT/LEFT HEART CATH AND CORONARY ANGIOGRAPHY N/A 05/19/2024   Procedure: RIGHT/LEFT HEART CATH AND CORONARY ANGIOGRAPHY;  Surgeon: Jordan, Peter M, MD;  Location: [facility] INVASIVE CV LAB;  Service: Cardiovascular;  Laterality: N/A;   ROTATOR CUFF REPAIR     SHOULDER SURGERY  08/03/2008   B/L    MEDICATIONS:  acetaminophen  (TYLENOL ) 325 MG tablet   aspirin  EC 325 MG tablet   clopidogrel  (PLAVIX ) 75 MG tablet   Continuous Glucose Sensor (FREESTYLE LIBRE 3 PLUS SENSOR) MISC   ezetimibe  (ZETIA ) 10 MG tablet   fenofibrate  (TRICOR ) 145 MG tablet   glucose blood test strip   Lancets (ONETOUCH ULTRASOFT) lancets   metFORMIN  (GLUCOPHAGE -XR) 750 MG 24 hr tablet   Multiple Vitamin (MULTIVITAMIN WITH MINERALS) TABS tablet   rosuvastatin  (CRESTOR ) 40 MG tablet   sertraline  (ZOLOFT ) 25 MG tablet   traZODone  (DESYREL ) 100 MG tablet   No current facility-administered medications for this encounter.     Isaiah Ruder, PA-C Surgical Short Stay/Anesthesiology [facility] Phone [phone] [facility] Phone [phone] 08/10/2024 12:28 PM

## 2024-08-10 NOTE — Anesthesia Preprocedure Evaluation (Signed)
 "                                  Anesthesia Evaluation  Patient identified by MRN, date of birth, ID band Patient awake    Reviewed: Allergy & Precautions, H&P , NPO status , Patient's Chart, lab work & pertinent test results  History of Anesthesia Complications Negative for: history of anesthetic complications  Airway Mallampati: II  TM Distance: >3 FB Neck ROM: Full    Dental no notable dental hx.    Pulmonary neg pulmonary ROS, Patient abstained from smoking., former smoker   Pulmonary exam normal breath sounds clear to auscultation       Cardiovascular + Past MI and + CABG  + dysrhythmias Atrial Fibrillation  Rhythm:Regular Rate:Normal  Carotid stenosis  CABG 05/2024  TTE 07/2024: IMPRESSIONS     1. Left ventricular ejection fraction, by estimation, is 60 to 65%. Left  ventricular ejection fraction by 3D volume is 64 %. The left ventricle has  normal function. The left ventricle has no regional wall motion  abnormalities. There is mild concentric  left ventricular hypertrophy. Left ventricular diastolic parameters are  consistent with Grade I diastolic dysfunction (impaired relaxation).   2. Right ventricular systolic function is normal. The right ventricular  size is mildly enlarged.   3. Left atrial size was mildly dilated.   4. The mitral valve is normal in structure. Trivial mitral valve  regurgitation. No evidence of mitral stenosis.   5. The aortic valve is tricuspid. There is mild calcification of the  aortic valve. Aortic valve regurgitation is not visualized. Aortic valve  sclerosis/calcification is present, without any evidence of aortic  stenosis.   6. The inferior vena cava is normal in size with greater than 50%  respiratory variability, suggesting right atrial pressure of 3 mmHg.      Neuro/Psych neg Seizures PSYCHIATRIC DISORDERS Anxiety Depression    negative neurological ROS     GI/Hepatic Neg liver ROS,GERD  ,,  Endo/Other   diabetes, Type 2    Renal/GU CRFRenal disease  negative genitourinary   Musculoskeletal  (+) Arthritis ,    Abdominal   Peds negative pediatric ROS (+)  Hematology negative hematology ROS (+)   Anesthesia Other Findings   Reproductive/Obstetrics negative OB ROS                              Anesthesia Physical Anesthesia Plan  ASA: 3  Anesthesia Plan: General   Post-op Pain Management: Ofirmev  IV (intra-op)*   Induction: Intravenous  PONV Risk Score and Plan: 2 and Ondansetron  and Dexamethasone   Airway Management Planned: Oral ETT  Additional Equipment: Arterial line  Intra-op Plan:   Post-operative Plan: Extubation in OR  Informed Consent: I have reviewed the patients History and Physical, chart, labs and discussed the procedure including the risks, benefits and alternatives for the proposed anesthesia with the patient or authorized representative who has indicated his/her understanding and acceptance.     Dental advisory given  Plan Discussed with: CRNA  Anesthesia Plan Comments: (PAT note written 08/10/2024 by Solomiya Pascale, PA-C.  Patient is a 69 year old male scheduled for the above procedure.   History includes former smoker, HTN, HLD, DM2, carotid artery disease, CAD (presented with unstable angina->s/p CABG: LIMA-LAD, RSVG to OM1 & OM2 05/23/2024), post-operative PAF (05/2024), bradycardia, GERD, melanoma excision (back, 2023).  Metoprolol  and amiodarone  discontinued after he required admission post-CABG on 06/06/2024 for orthostasis and bradycardia. He was doing well at 06/12/2024 cardiology follow-up visit. Updated echo ordered, and a monitor ordered to assess for recurrent afib. Monitor showed no afib. 07/20/2024 TTE showed LVEF 60-65%, no RWMA, mild concentric LVH, grade 1 DD, normal RV systolic function, mild RVH, trivial MR.     A1c is 5.8%. He is on metformin  and has a [employer] 3 CGM.   Per Dr. Lanis he is to  continue Plavix  and aspirin . )         Anesthesia Quick Evaluation  "

## 2024-08-14 ENCOUNTER — Inpatient Hospital Stay (HOSPITAL_COMMUNITY)
Admission: RE | Admit: 2024-08-14 | Discharge: 2024-08-15 | DRG: 039 | Disposition: A | Discharge status: Home / Self Care | Attending: Vascular Surgery | Admitting: Vascular Surgery

## 2024-08-14 ENCOUNTER — Encounter (HOSPITAL_COMMUNITY): Payer: Self-pay | Admitting: Vascular Surgery

## 2024-08-14 ENCOUNTER — Encounter (HOSPITAL_COMMUNITY): Admission: RE | Disposition: A | Payer: Self-pay | Source: Home / Self Care | Attending: Vascular Surgery

## 2024-08-14 ENCOUNTER — Inpatient Hospital Stay (HOSPITAL_COMMUNITY): Admitting: Anesthesiology

## 2024-08-14 ENCOUNTER — Encounter (HOSPITAL_COMMUNITY): Admitting: Vascular Surgery

## 2024-08-14 DIAGNOSIS — I6522 Occlusion and stenosis of left carotid artery: Principal | ICD-10-CM | POA: Diagnosis present

## 2024-08-14 DIAGNOSIS — I252 Old myocardial infarction: Secondary | ICD-10-CM | POA: Diagnosis not present

## 2024-08-14 DIAGNOSIS — F418 Other specified anxiety disorders: Secondary | ICD-10-CM | POA: Diagnosis not present

## 2024-08-14 DIAGNOSIS — E785 Hyperlipidemia, unspecified: Secondary | ICD-10-CM | POA: Diagnosis present

## 2024-08-14 DIAGNOSIS — Z85820 Personal history of malignant melanoma of skin: Secondary | ICD-10-CM | POA: Diagnosis not present

## 2024-08-14 DIAGNOSIS — Z87891 Personal history of nicotine dependence: Secondary | ICD-10-CM

## 2024-08-14 DIAGNOSIS — Z833 Family history of diabetes mellitus: Secondary | ICD-10-CM

## 2024-08-14 DIAGNOSIS — E119 Type 2 diabetes mellitus without complications: Secondary | ICD-10-CM | POA: Diagnosis present

## 2024-08-14 DIAGNOSIS — I4891 Unspecified atrial fibrillation: Secondary | ICD-10-CM | POA: Diagnosis not present

## 2024-08-14 DIAGNOSIS — Z8249 Family history of ischemic heart disease and other diseases of the circulatory system: Secondary | ICD-10-CM

## 2024-08-14 DIAGNOSIS — I6529 Occlusion and stenosis of unspecified carotid artery: Principal | ICD-10-CM | POA: Diagnosis present

## 2024-08-14 DIAGNOSIS — Z951 Presence of aortocoronary bypass graft: Secondary | ICD-10-CM

## 2024-08-14 DIAGNOSIS — Z01818 Encounter for other preprocedural examination: Secondary | ICD-10-CM

## 2024-08-14 DIAGNOSIS — F32A Depression, unspecified: Secondary | ICD-10-CM | POA: Diagnosis present

## 2024-08-14 HISTORY — PX: PATCH ANGIOPLASTY: SHX6230

## 2024-08-14 HISTORY — PX: ENDARTERECTOMY: SHX5162

## 2024-08-14 LAB — GLUCOSE, CAPILLARY
Glucose-Capillary: 104 mg/dL — ABNORMAL HIGH (ref 70–99)
Glucose-Capillary: 145 mg/dL — ABNORMAL HIGH (ref 70–99)
Glucose-Capillary: 179 mg/dL — ABNORMAL HIGH (ref 70–99)
Glucose-Capillary: 204 mg/dL — ABNORMAL HIGH (ref 70–99)

## 2024-08-14 LAB — HEMOGLOBIN AND HEMATOCRIT, BLOOD
HCT: 38.6 % — ABNORMAL LOW (ref 39.0–52.0)
Hemoglobin: 13 g/dL (ref 13.0–17.0)

## 2024-08-14 MED ORDER — EPHEDRINE SULFATE-NACL 50-0.9 MG/10ML-% IV SOSY
PREFILLED_SYRINGE | INTRAVENOUS | Status: DC | PRN
  Administered 2024-08-14 (×4): 2.5 mg via INTRAVENOUS

## 2024-08-14 MED ORDER — ONDANSETRON HCL 4 MG/2ML IJ SOLN: 4.0000 mg | Freq: Four times a day (QID) | INTRAMUSCULAR | Status: DC | PRN

## 2024-08-14 MED ORDER — CEFAZOLIN SODIUM-DEXTROSE 2-4 GM/100ML-% IV SOLN
2.0000 g | Freq: Three times a day (TID) | INTRAVENOUS | Status: AC
  Administered 2024-08-14 – 2024-08-15 (×2): 2 g via INTRAVENOUS
  Filled 2024-08-14 (×2): qty 100

## 2024-08-14 MED ORDER — VASOPRESSIN 20 UNIT/ML IV SOLN
INTRAVENOUS | Status: AC
  Filled 2024-08-14: qty 1

## 2024-08-14 MED ORDER — ONDANSETRON HCL 4 MG/2ML IJ SOLN
INTRAMUSCULAR | Status: AC
  Filled 2024-08-14: qty 2

## 2024-08-14 MED ORDER — PROTAMINE SULFATE 10 MG/ML IV SOLN
INTRAVENOUS | Status: DC | PRN
  Administered 2024-08-14: 10 mg via INTRAVENOUS
  Administered 2024-08-14: 40 mg via INTRAVENOUS

## 2024-08-14 MED ORDER — HEPARIN SODIUM (PORCINE) 1000 UNIT/ML IJ SOLN
INTRAMUSCULAR | Status: AC
  Filled 2024-08-14: qty 10

## 2024-08-14 MED ORDER — CEFAZOLIN SODIUM-DEXTROSE 2-4 GM/100ML-% IV SOLN: 2.0000 g | Freq: Three times a day (TID) | INTRAVENOUS | Status: DC

## 2024-08-14 MED ORDER — LIDOCAINE-EPINEPHRINE (PF) 1 %-1:200000 IJ SOLN
INTRAMUSCULAR | Status: AC
  Filled 2024-08-14: qty 30

## 2024-08-14 MED ORDER — POLYETHYLENE GLYCOL 3350 17 G PO PACK: 17.0000 g | PACK | Freq: Every day | ORAL | Status: DC | PRN

## 2024-08-14 MED ORDER — OXYCODONE HCL 5 MG/5ML PO SOLN: 5.0000 mg | Freq: Once | ORAL | Status: DC | PRN

## 2024-08-14 MED ORDER — SODIUM CHLORIDE (PF) 0.9 % IJ SOLN
INTRAMUSCULAR | Status: AC
  Filled 2024-08-14: qty 20

## 2024-08-14 MED ORDER — PROPOFOL 10 MG/ML IV BOLUS
INTRAVENOUS | Status: DC | PRN
  Administered 2024-08-14: 70 mg via INTRAVENOUS
  Administered 2024-08-14: 20 mg via INTRAVENOUS
  Administered 2024-08-14: 30 mg via INTRAVENOUS

## 2024-08-14 MED ORDER — ASPIRIN 325 MG PO TBEC
325.0000 mg | DELAYED_RELEASE_TABLET | Freq: Every day | ORAL | Status: DC
  Administered 2024-08-15: 325 mg via ORAL
  Filled 2024-08-14: qty 1

## 2024-08-14 MED ORDER — EZETIMIBE 10 MG PO TABS
10.0000 mg | ORAL_TABLET | Freq: Every day | ORAL | Status: DC
  Administered 2024-08-15: 10 mg via ORAL
  Filled 2024-08-14: qty 1

## 2024-08-14 MED ORDER — PHENOL 1.4 % MT LIQD: 1.0000 | OROMUCOSAL | Status: DC | PRN

## 2024-08-14 MED ORDER — SODIUM CHLORIDE 0.9 % IV SOLN: INTRAVENOUS | Status: DC

## 2024-08-14 MED ORDER — ACETAMINOPHEN 325 MG PO TABS
325.0000 mg | ORAL_TABLET | ORAL | Status: DC | PRN
  Administered 2024-08-14: 650 mg via ORAL
  Filled 2024-08-14: qty 2

## 2024-08-14 MED ORDER — DOCUSATE SODIUM 100 MG PO CAPS
100.0000 mg | ORAL_CAPSULE | Freq: Every day | ORAL | Status: DC
  Filled 2024-08-14: qty 1

## 2024-08-14 MED ORDER — SURGIFLO WITH THROMBIN (HEMOSTATIC MATRIX KIT) OPTIME
TOPICAL | Status: DC | PRN
  Administered 2024-08-14: 1 via TOPICAL

## 2024-08-14 MED ORDER — GLYCOPYRROLATE 0.2 MG/ML IJ SOLN
INTRAMUSCULAR | Status: DC | PRN
  Administered 2024-08-14: .2 mg via INTRAVENOUS

## 2024-08-14 MED ORDER — CHLORHEXIDINE GLUCONATE 0.12 % MT SOLN
15.0000 mL | Freq: Once | OROMUCOSAL | Status: AC
  Administered 2024-08-14: 15 mL via OROMUCOSAL
  Filled 2024-08-14: qty 15

## 2024-08-14 MED ORDER — HEPARIN 6000 UNIT IRRIGATION SOLUTION
Status: AC
  Filled 2024-08-14: qty 500

## 2024-08-14 MED ORDER — FENTANYL CITRATE (PF) 250 MCG/5ML IJ SOLN
INTRAMUSCULAR | Status: AC
  Filled 2024-08-14: qty 5

## 2024-08-14 MED ORDER — SODIUM CHLORIDE 0.9 % IV SOLN: 500.0000 mL | Freq: Once | INTRAVENOUS | Status: DC | PRN

## 2024-08-14 MED ORDER — ACETAMINOPHEN 10 MG/ML IV SOLN: 1000.0000 mg | Freq: Once | INTRAVENOUS | Status: DC | PRN

## 2024-08-14 MED ORDER — PHENYLEPHRINE 80 MCG/ML (10ML) SYRINGE FOR IV PUSH (FOR BLOOD PRESSURE SUPPORT)
PREFILLED_SYRINGE | INTRAVENOUS | Status: AC
  Filled 2024-08-14: qty 10

## 2024-08-14 MED ORDER — DEXAMETHASONE SOD PHOSPHATE PF 10 MG/ML IJ SOLN
INTRAMUSCULAR | Status: DC | PRN
  Administered 2024-08-14: 5 mg via INTRAVENOUS

## 2024-08-14 MED ORDER — SUGAMMADEX SODIUM 200 MG/2ML IV SOLN
INTRAVENOUS | Status: DC | PRN
  Administered 2024-08-14: 250 mg via INTRAVENOUS

## 2024-08-14 MED ORDER — HYDRALAZINE HCL 20 MG/ML IJ SOLN: 5.0000 mg | INTRAMUSCULAR | Status: DC | PRN

## 2024-08-14 MED ORDER — PHENYLEPHRINE 80 MCG/ML (10ML) SYRINGE FOR IV PUSH (FOR BLOOD PRESSURE SUPPORT)
PREFILLED_SYRINGE | INTRAVENOUS | Status: DC | PRN
  Administered 2024-08-14 (×2): 40 ug via INTRAVENOUS
  Administered 2024-08-14: 80 ug via INTRAVENOUS
  Administered 2024-08-14: 40 ug via INTRAVENOUS
  Administered 2024-08-14: 80 ug via INTRAVENOUS
  Administered 2024-08-14: 40 ug via INTRAVENOUS

## 2024-08-14 MED ORDER — 0.9 % SODIUM CHLORIDE (POUR BTL) OPTIME
TOPICAL | Status: DC | PRN
  Administered 2024-08-14: 2000 mL

## 2024-08-14 MED ORDER — MIDAZOLAM HCL (PF) 2 MG/2ML IJ SOLN
INTRAMUSCULAR | Status: DC | PRN
  Administered 2024-08-14: 1 mg via INTRAVENOUS

## 2024-08-14 MED ORDER — POTASSIUM CHLORIDE CRYS ER 20 MEQ PO TBCR: 40.0000 meq | EXTENDED_RELEASE_TABLET | Freq: Every day | ORAL | Status: DC | PRN

## 2024-08-14 MED ORDER — GLYCOPYRROLATE PF 0.2 MG/ML IJ SOSY
PREFILLED_SYRINGE | INTRAMUSCULAR | Status: AC
  Filled 2024-08-14: qty 1

## 2024-08-14 MED ORDER — SERTRALINE HCL 25 MG PO TABS
25.0000 mg | ORAL_TABLET | Freq: Every day | ORAL | Status: DC
  Administered 2024-08-15: 25 mg via ORAL
  Filled 2024-08-14: qty 1

## 2024-08-14 MED ORDER — CEFAZOLIN SODIUM-DEXTROSE 2-4 GM/100ML-% IV SOLN
2.0000 g | INTRAVENOUS | Status: AC
  Administered 2024-08-14: 2 g via INTRAVENOUS
  Filled 2024-08-14: qty 100

## 2024-08-14 MED ORDER — LIDOCAINE 2% (20 MG/ML) 5 ML SYRINGE
INTRAMUSCULAR | Status: AC
  Filled 2024-08-14: qty 5

## 2024-08-14 MED ORDER — PROPOFOL 10 MG/ML IV BOLUS
INTRAVENOUS | Status: AC
  Filled 2024-08-14: qty 20

## 2024-08-14 MED ORDER — LACTATED RINGERS IV SOLN: INTRAVENOUS | Status: DC

## 2024-08-14 MED ORDER — OXYCODONE-ACETAMINOPHEN 5-325 MG PO TABS: 1.0000 | ORAL_TABLET | ORAL | Status: DC | PRN

## 2024-08-14 MED ORDER — CHLORHEXIDINE GLUCONATE CLOTH 2 % EX PADS
6.0000 | MEDICATED_PAD | Freq: Once | CUTANEOUS | Status: AC
  Administered 2024-08-14: 6 via TOPICAL

## 2024-08-14 MED ORDER — FENTANYL CITRATE (PF) 250 MCG/5ML IJ SOLN
INTRAMUSCULAR | Status: DC | PRN
  Administered 2024-08-14: 100 ug via INTRAVENOUS
  Administered 2024-08-14 (×3): 50 ug via INTRAVENOUS

## 2024-08-14 MED ORDER — DROPERIDOL 2.5 MG/ML IJ SOLN: 0.6250 mg | Freq: Once | INTRAMUSCULAR | Status: DC | PRN

## 2024-08-14 MED ORDER — DEXAMETHASONE SOD PHOSPHATE PF 10 MG/ML IJ SOLN
INTRAMUSCULAR | Status: AC
  Filled 2024-08-14: qty 1

## 2024-08-14 MED ORDER — HYDRALAZINE HCL 20 MG/ML IJ SOLN
INTRAMUSCULAR | Status: DC | PRN
  Administered 2024-08-14: 5 mg via INTRAVENOUS

## 2024-08-14 MED ORDER — HEMOSTATIC AGENTS (NO CHARGE) OPTIME
TOPICAL | Status: DC | PRN
  Administered 2024-08-14: 2 via TOPICAL

## 2024-08-14 MED ORDER — CHLORHEXIDINE GLUCONATE CLOTH 2 % EX PADS: 6.0000 | MEDICATED_PAD | Freq: Once | CUTANEOUS | Status: DC

## 2024-08-14 MED ORDER — ROCURONIUM BROMIDE 10 MG/ML (PF) SYRINGE
PREFILLED_SYRINGE | INTRAVENOUS | Status: DC | PRN
  Administered 2024-08-14: 20 mg via INTRAVENOUS
  Administered 2024-08-14: 10 mg via INTRAVENOUS
  Administered 2024-08-14: 20 mg via INTRAVENOUS
  Administered 2024-08-14: 60 mg via INTRAVENOUS

## 2024-08-14 MED ORDER — HEPARIN SODIUM (PORCINE) 1000 UNIT/ML IJ SOLN
INTRAMUSCULAR | Status: DC | PRN
  Administered 2024-08-14: 3000 [IU] via INTRAVENOUS
  Administered 2024-08-14: 1000 [IU] via INTRAVENOUS
  Administered 2024-08-14: 8000 [IU] via INTRAVENOUS
  Administered 2024-08-14: 3000 [IU] via INTRAVENOUS

## 2024-08-14 MED ORDER — METOPROLOL TARTRATE 5 MG/5ML IV SOLN: 2.5000 mg | INTRAVENOUS | Status: DC | PRN

## 2024-08-14 MED ORDER — TRAZODONE HCL 100 MG PO TABS
100.0000 mg | ORAL_TABLET | Freq: Every day | ORAL | Status: DC
  Administered 2024-08-14: 100 mg via ORAL
  Filled 2024-08-14: qty 1

## 2024-08-14 MED ORDER — OXYCODONE HCL 5 MG PO TABS: 5.0000 mg | ORAL_TABLET | Freq: Once | ORAL | Status: DC | PRN

## 2024-08-14 MED ORDER — MORPHINE SULFATE (PF) 2 MG/ML IV SOLN: 2.0000 mg | INTRAVENOUS | Status: DC | PRN

## 2024-08-14 MED ORDER — ORAL CARE MOUTH RINSE: 15.0000 mL | Freq: Once | OROMUCOSAL | Status: AC

## 2024-08-14 MED ORDER — INSULIN ASPART 100 UNIT/ML IJ SOLN
0.0000 [IU] | Freq: Three times a day (TID) | INTRAMUSCULAR | Status: DC
  Administered 2024-08-14: 3 [IU] via SUBCUTANEOUS
  Administered 2024-08-15: 2 [IU] via SUBCUTANEOUS
  Filled 2024-08-14: qty 3
  Filled 2024-08-14: qty 1

## 2024-08-14 MED ORDER — ROCURONIUM BROMIDE 10 MG/ML (PF) SYRINGE
PREFILLED_SYRINGE | INTRAVENOUS | Status: AC
  Filled 2024-08-14: qty 20

## 2024-08-14 MED ORDER — ROSUVASTATIN CALCIUM 20 MG PO TABS
40.0000 mg | ORAL_TABLET | Freq: Every day | ORAL | Status: DC
  Administered 2024-08-15: 40 mg via ORAL
  Filled 2024-08-14: qty 2

## 2024-08-14 MED ORDER — HEPARIN 6000 UNIT IRRIGATION SOLUTION
Status: DC | PRN
  Administered 2024-08-14: 1

## 2024-08-14 MED ORDER — LIDOCAINE HCL (PF) 1 % IJ SOLN
INTRAMUSCULAR | Status: AC
  Filled 2024-08-14: qty 5

## 2024-08-14 MED ORDER — PHENYLEPHRINE HCL-NACL 20-0.9 MG/250ML-% IV SOLN
INTRAVENOUS | Status: DC | PRN
  Administered 2024-08-14: 25 ug/min via INTRAVENOUS

## 2024-08-14 MED ORDER — FENTANYL CITRATE (PF) 100 MCG/2ML IJ SOLN: 25.0000 ug | INTRAMUSCULAR | Status: DC | PRN

## 2024-08-14 MED ORDER — MIDAZOLAM HCL 2 MG/2ML IJ SOLN
INTRAMUSCULAR | Status: AC
  Filled 2024-08-14: qty 2

## 2024-08-14 MED ORDER — PROTAMINE SULFATE 10 MG/ML IV SOLN
INTRAVENOUS | Status: AC
  Filled 2024-08-14: qty 5

## 2024-08-14 MED ORDER — ACETAMINOPHEN 325 MG RE SUPP: 325.0000 mg | RECTAL | Status: DC | PRN

## 2024-08-14 MED ORDER — LIDOCAINE-EPINEPHRINE (PF) 1 %-1:200000 IJ SOLN
INTRAMUSCULAR | Status: DC | PRN
  Administered 2024-08-14: 3 mL

## 2024-08-14 MED ORDER — INSULIN ASPART 100 UNIT/ML IJ SOLN: 0.0000 [IU] | INTRAMUSCULAR | Status: DC | PRN

## 2024-08-14 MED ORDER — ROCURONIUM BROMIDE 10 MG/ML (PF) SYRINGE
PREFILLED_SYRINGE | INTRAVENOUS | Status: AC
  Filled 2024-08-14: qty 10

## 2024-08-14 MED ORDER — ONDANSETRON HCL 4 MG/2ML IJ SOLN
INTRAMUSCULAR | Status: DC | PRN
  Administered 2024-08-14: 4 mg via INTRAVENOUS

## 2024-08-14 MED ORDER — LABETALOL HCL 5 MG/ML IV SOLN: 10.0000 mg | INTRAVENOUS | Status: DC | PRN

## 2024-08-14 MED ORDER — LIDOCAINE 2% (20 MG/ML) 5 ML SYRINGE
INTRAMUSCULAR | Status: DC | PRN
  Administered 2024-08-14: 100 mg via INTRAVENOUS

## 2024-08-14 NOTE — Anesthesia Procedure Notes (Signed)
 Arterial Line Insertion Start/End1/07/2025 7:10 AM, 08/14/2024 7:20 AM Performed by: Zelphia Norleen HERO, CRNA, CRNA  Patient location: Pre-op. Preanesthetic checklist: patient identified, IV checked, site marked, risks and benefits discussed, surgical consent, monitors and equipment checked, pre-op evaluation, timeout performed and anesthesia consent Lidocaine  1% used for infiltration Right, radial was placed Catheter size: 20 G Hand hygiene performed  and maximum sterile barriers used   Attempts: 2 Following insertion, dressing applied and Biopatch. Post procedure assessment: normal  Post procedure complications: local hematoma. Patient tolerated the procedure well with no immediate complications.

## 2024-08-14 NOTE — H&P (Signed)
 " Office Note   Patient seen and examined in preop holding.  No complaints. No changes to medication history or physical exam since last seen in clinic. I called Lem last week to discuss CT findings which demonstrated a more proximal lesion.  I think that TCAR would be disadvantaged in this case.  I discussed this with radiology my partners as well, who also agree. After discussing the risks and benefits of endarterectomy of the left common carotid artery and internal carotid artery, Matthew Patterson elected to proceed.   Fonda FORBES Rim MD   Fonda FORBES Rim MD [facility] Phone Number: [phone] 08/14/2024 7:23 AM    CC:  Carotid stenosis Requesting Provider:  No ref. provider found  HPI: Matthew Patterson is a 69 y.o. (January 28, 1956) male presenting at the request of .Nafziger, Darleene, NP for left carotid artery stenosis appreciated prior to CABG. He presents today after CT angio head and neck to discuss surgical options.  Patient underwent two-vessel CABG by Dr. Shyrl on 05/23/2024.  Preoperative imaging demonstrated critical stenosis of the left ICA.  There also appear to be right sided vertebral artery retrograde flow.  On exam, Matthew Patterson was doing well.  I follow his wife in the outpatient setting for FMD.  It was nice seeing her today as well.  Matthew Patterson has been doing well since surgery, and feels like he is getting his strength back.  He has not been completely cleared by cardiac surgery, and continues to have been restrictions due to sternotomy.  Matthew Patterson denies history of TIA, stroke, amaurosis since seen last  The pt is  on a statin for cholesterol management.  The pt is  on a daily aspirin .   Other AC:  -  Past Medical History:  Diagnosis Date   Anxiety and depression    Carotid artery occlusion    Diabetes mellitus without complication (HCC)    GERD (gastroesophageal reflux disease)    Hyperlipidemia    Melanoma (HCC) 2023   back    Myocardial infarction [facility]) 05-17-24    Past Surgical History:  Procedure Laterality Date   CORONARY ARTERY BYPASS GRAFT N/A 05/23/2024   Procedure: CORONARY ARTERY BYPASS GRAFTING (CABG) X THREE, USING LEFT INTERNAL MAMMARY ARTERY AND RIGHT LEG GREATER SAPHENOUS VEIN HARVESTED ENDOSCOPICALLY;  Surgeon: Shyrl Linnie KIDD, MD;  Location: [facility] OR;  Service: Open Heart Surgery;  Laterality: N/A;   MELANOMA EXCISION  2023   back   RIGHT/LEFT HEART CATH AND CORONARY ANGIOGRAPHY N/A 05/19/2024   Procedure: RIGHT/LEFT HEART CATH AND CORONARY ANGIOGRAPHY;  Surgeon: Jordan, Peter M, MD;  Location: [facility] INVASIVE CV LAB;  Service: Cardiovascular;  Laterality: N/A;   ROTATOR CUFF REPAIR     SHOULDER SURGERY  08/03/2008   B/L    Social History   Socioeconomic History   Marital status: Married    Spouse name: Not on file   Number of children: Not on file   Years of education: Not on file   Highest education level: Associate degree: academic program  Occupational History   Not on file  Tobacco Use   Smoking status: Former    Types: Cigarettes, Cigars    Start date: 2010   Smokeless tobacco: Never  Vaping Use   Vaping status: Never Used  Substance and Sexual Activity   Alcohol use: Yes    Alcohol/week: 1.0 - 2.0 standard drink of alcohol    Types: 1 - 2 Cans of beer per week  Drug use: No   Sexual activity: Not on file  Other Topics Concern   Not on file  Social History Narrative   Senior buyer for manufacturing    Married   One children    One grandchild      He likes to play golf    Social Drivers of Health   Tobacco Use: Medium Risk (08/09/2024)   Patient History    Smoking Tobacco Use: Former    Smokeless Tobacco Use: Never    Passive Exposure: Not on Actuary Strain: Low Risk (07/11/2024)   Overall Financial Resource Strain (CARDIA)    Difficulty of Paying Living Expenses: Not hard at all  Food Insecurity: No Food Insecurity (07/11/2024)   Epic    Worried  About Programme Researcher, Broadcasting/film/video in the Last Year: Never true    Ran Out of Food in the Last Year: Never true  Transportation Needs: No Transportation Needs (07/11/2024)   Epic    Lack of Transportation (Medical): No    Lack of Transportation (Non-Medical): No  Physical Activity: Insufficiently Active (07/11/2024)   Exercise Vital Sign    Days of Exercise per Week: 2 days    Minutes of Exercise per Session: 20 min  Stress: No Stress Concern Present (07/11/2024)   Matthew Patterson    Feeling of Stress: Only a little  Social Connections: Socially Integrated (07/11/2024)   Social Connection and Isolation Panel    Frequency of Communication with Friends and Family: Twice a week    Frequency of Social Gatherings with Friends and Family: Once a week    Attends Religious Services: 1 to 4 times per year    Active Member of [employer] or Organizations: Yes    Attends Banker Meetings: More than 4 times per year    Marital Status: Married  Catering Manager Violence: Not At Risk (06/07/2024)   Epic    Fear of Current or Ex-Partner: No    Emotionally Abused: No    Physically Abused: No    Sexually Abused: No  Depression (PHQ2-9): Low Risk (05/05/2024)   Depression (PHQ2-9)    PHQ-2 Score: 0  Alcohol Screen: Low Risk (07/11/2024)   Alcohol Screen    Last Alcohol Screening Score (AUDIT): 1  Housing: Low Risk (07/11/2024)   Epic    Unable to Pay for Housing in the Last Year: No    Number of Times Moved in the Last Year: 0    Homeless in the Last Year: No  Utilities: Not At Risk (06/07/2024)   Epic    Threatened with loss of utilities: No  Health Literacy: Not on file   Family History  Problem Relation Age of Onset   Early death Father    Early death Maternal Grandfather    Diabetes Mother    Hypertension Mother    Heart disease Mother    Cancer Sister        uterine cancer   Cancer Sister    Cancer Sister    Cancer Sister      Current Facility-Administered Medications  Medication Dose Route Frequency Provider Last Rate Last Admin   0.9 %  sodium chloride  infusion   Intravenous Continuous Embry Huss E, MD       ceFAZolin  (ANCEF ) IVPB 2g/100 mL premix  2 g Intravenous 30 min Pre-Op Gwendlyn Hanback E, MD       Chlorhexidine  Gluconate Cloth 2 % PADS 6 each  6 each Topical Once Sidnee Gambrill E, MD       insulin  aspart (novoLOG ) injection 0-14 Units  0-14 Units Subcutaneous Q2H PRN Erma Thom SAUNDERS, MD       lactated ringers  infusion   Intravenous Continuous Leopoldo Bruckner, MD        No Known Allergies   REVIEW OF SYSTEMS:  [X]  denotes positive finding, [ ]  denotes negative finding Cardiac  Comments:  Chest pain or chest pressure:    Shortness of breath upon exertion:    Short of breath when lying flat:    Irregular heart rhythm:        Vascular    Pain in calf, thigh, or hip brought on by ambulation:    Pain in feet at night that wakes you up from your sleep:     Blood clot in your veins:    Leg swelling:         Pulmonary    Oxygen at home:    Productive cough:     Wheezing:         Neurologic    Sudden weakness in arms or legs:     Sudden numbness in arms or legs:     Sudden onset of difficulty speaking or slurred speech:    Temporary loss of vision in one eye:     Problems with dizziness:         Gastrointestinal    Blood in stool:     Vomited blood:         Genitourinary    Burning when urinating:     Blood in urine:        Psychiatric    Major depression:         Hematologic    Bleeding problems:    Problems with blood clotting too easily:        Skin    Rashes or ulcers:        Constitutional    Fever or chills:      PHYSICAL EXAMINATION:  Vitals:   08/14/24 0556  BP: (!) 140/83  Pulse: (!) 56  Resp: 20  Temp: 98.7 F (37.1 C)  SpO2: 96%  Weight: 77.9 kg  Height: 5' 9 (1.753 m)    General:  WDWN in NAD; vital signs documented above Gait: Not  observed HENT: WNL, normocephalic Pulmonary: normal non-labored breathing , without wheezing Cardiac: regular HR Abdomen: soft, NT, no masses Skin: without rashes Vascular Exam/Pulses:  Right Left  Radial 2+ (normal) 2+ (normal)  Ulnar    Femoral    Popliteal    DP 2+ (normal) 2+ (normal)  PT     Extremities: without ischemic changes, without Gangrene , without cellulitis; without open wounds;  Musculoskeletal: no muscle wasting or atrophy  Neurologic: A&O X 3;  No focal weakness or paresthesias are detected Psychiatric:  The pt has Normal affect.   Non-Invasive Vascular Imaging:    Right Carotid Findings:  +----------+--------+--------+--------+------------+------------------+           PSV cm/sEDV cm/sStenosisDescribe    Comments            +----------+--------+--------+--------+------------+------------------+  CCA Prox  81      18                          intimal thickening  +----------+--------+--------+--------+------------+------------------+  CCA Distal93      21  intimal thickening  +----------+--------+--------+--------+------------+------------------+  ICA Prox  95      21              heterogenous                    +----------+--------+--------+--------+------------+------------------+  ICA Mid   122     33                                              +----------+--------+--------+--------+------------+------------------+  ICA Distal79      25                                              +----------+--------+--------+--------+------------+------------------+  ECA      134     22                                              +----------+--------+--------+--------+------------+------------------+   +----------+--------+-------+--------+------------+           PSV cm/sEDV cmsDescribeArm Pressure  +----------+--------+-------+--------+------------+  [id number]                    113           +----------+--------+-------+--------+------------+   +---------+--------+--------+----------+  VertebralPSV cm/sEDV cm/sRetrograde  +---------+--------+--------+----------+   Left Carotid Findings:  +----------+--------+--------+--------+------------+------------------+           PSV cm/sEDV cm/sStenosisDescribe    Comments            +----------+--------+--------+--------+------------+------------------+  CCA Prox  88      16                          intimal thickening  +----------+--------+--------+--------+------------+------------------+  CCA Distal124     21              heterogenous                    +----------+--------+--------+--------+------------+------------------+  ICA Prox  424     169     80-99%  calcific                        +----------+--------+--------+--------+------------+------------------+  ICA Mid   167     47                                              +----------+--------+--------+--------+------------+------------------+  ICA Distal62      18                                              +----------+--------+--------+--------+------------+------------------+  ECA      83      12                                              +----------+--------+--------+--------+------------+------------------+  ASSESSMENT/PLAN: Matthew Patterson is a 69 y.o. male presenting with asymptomatic left ICA stenosis.  He is now 1 month status post CABG.  We had a nice conversation regarding cerebrovascular disease and the natural history.  He is aware that with critical ICA stenosis, he has an elevated risk of stroke over the next 4 years.  This is reduced with cerebrovascular revascularization..  I think it is reasonable to treat his left-sided stenosis.  He is a candidate for both carotid endarterectomy and transcarotid artery revascularization.  And I had a long discussion regarding the above, most notably the  risks and benefits of each.  Using shared decision making, And elected to move forward with transcarotid artery revascularization.    I have ordered Plavix , as dual antiplatelet therapy is necessary for stent placement. I will discuss his clearance with Dr. Shyrl and follow-up his recent echo.   Fonda FORBES Rim, MD [facility] [phone]  "

## 2024-08-14 NOTE — Op Note (Signed)
 "   NAME: Matthew Patterson    MRN: [id number] DOB: 11-Feb-1956    DATE OF OPERATION: 08/14/2024  PREOP DIAGNOSIS:    Symptomatic, critical stenosis of the left internal carotid artery  POSTOP DIAGNOSIS:    Same  PROCEDURE:    Left sided carotid endarterectomy with bovine pericardial patch plasty  SURGEON: Fonda FORBES Rim  ASSIST: Donnice Sender, PA  ANESTHESIA: General  EBL:  INDICATIONS:    Matthew Patterson is a 69 y.o. male with recent history of CABG, with pre-CABG workup noting a symptomatic left-sided critical ICA stenosis.  I have seen him in the outpatient setting, and we discussed the risks and benefits of carotid endarterectomy versus TCAR.  I think TCAR is an inferior operation in his case due to the tandem lesion in the common carotid artery.  After discussing the risks and benefits of carotid endarterectomy, And elected to proceed.  FINDINGS:   Left common carotid artery stenosis 65%, left internal carotid artery stenosis 95%.  TECHNIQUE:    After full informed written consent was obtained from the patient, the patient was brought back to the operating room and placed supine upon the operating table.  Prior to induction, the patient received IV antibiotics.  After obtaining adequate anesthesia, the patient was placed into semi-Fowler position with a shoulder roll in place and the patient's neck slightly hyperextended and rotated away from the surgical site.  The patient was prepped in the standard fashion for a left carotid endarterectomy.  I made an incision anterior to the sternocleidomastoid muscle and dissected down through the subcutaneous tissue.  The platysmas was opened with electrocautery.  Then I dissected down to the internal jugular vein.  This was dissected posteriorly until I obtained visualization of the common carotid artery.  This was dissected out and then an umbilical tape was placed around the common carotid artery and I loosely applied a Rumel  tourniquet.  I then dissected in a periadventitial fashion along the common carotid artery up to the bifurcation.  I then identified the external carotid artery and the superior thyroid  artery.  A 2-0 silk tie was looped around the superior thyroid  artery, and I also dissected out the external carotid artery and placed a vessel loop around it.  In continuing the dissection to the internal carotid artery, I identified the facial vein.  The facial vein was abnormal, and a web of veins, rather than 1 single trunk..  These were ligated and then transected, giving me improved exposure of the internal carotid artery.  In the process of this dissection, the hypoglossal nerve was identified.  I then dissected out the internal carotid artery until I identified an area of soft tissue in the internal carotid artery.  I dissected slightly distal to this area, and placed an umbilical tape around the artery and loosely applied a Rumel tourniquet.  At this point, we gave the patient a therapeutic bolus of Heparin  intravenously (roughly 100 units/kg).  After confirming an ACT greater than 250, I clamped the internal carotid artery, external carotid artery and then the common carotid artery.  I then made an arteriotomy in the common carotid artery with a 11 blade, and extended the arteriotomy with a Potts scissor down into the common carotid artery, then I carried the arteriotomy through the bifurcation into the internal carotid artery until I reached an area that was not diseased.  I had excellent, pulsatile backbleeding.  I elected not to shunt as the lesion was high,  and I was well under the hypoglossal nerve with a rather acute angle in the internal carotid artery. At this point, I started the endarterectomy in the common carotid artery with a Cytogeneticist and carried this dissection down into the common carotid artery circumferentially.  Then I transected the plaque at a segment where it was adherent.  I then carried this  dissection up into the external carotid artery.  The plaque was extracted by unclamping the external carotid artery and everting the artery.  The dissection was then carried into the internal carotid artery, extracting the remaining portion of the carotid plaque.  I passed the plaque off the field as a specimen.  I then spent the next 30 minutes removing intimal flaps and loose debris.  Eventually I reached the point where the residual plaque was densely adherent and any further dissection would compromise the integrity of the wall.  After verifying that there was no more loose intimal flaps or debris, I re-interrogated the entirety of this carotid artery.  At this point, I was satisfied that the minimal remaining disease was densely adherent to the wall and wall integrity was intact.  At this point, I then fashioned a bovine pericardial patch for the geometry of this artery and sewed it in place with two running stitch of 6-0 Prolene, one from each end.  Prior to completing this patch angioplasty, I allowed the internal, common, external carotid artery to backbleed. Then I instilled heparinized saline in this patched artery and then completed the patch angioplasty in the usual fashion.  First, I released the clamp on the external carotid artery, then I released it on the common carotid artery.  After waiting a few seconds, I then released it on the internal carotid artery.  I then interrogated this patient's arteries with the continuous Doppler.  The audible waveforms in each artery were consistent with the expected characteristics for each artery.  The Sonosite probe was then sterilely draped and used to interrogate the carotid artery in both longitudinal and transverse views.  At this point, I washed out the wound, and placed thrombin  product throughout..  I also gave the patient 50 mg of protamine  to reverse his anticoagulation.  Repair sutures were used.  I elected to place a drain due to surgical site is from  dual antiplatelet therapy.  A 15 French JP was placed in the wound bed.  I then reapproximated the platysma muscle with a running stitch of 3-0 Vicryl.  The skin was then reapproximated with a running subcuticular 4-0 Monocryl stitch.  The skin was then cleaned, dried and Dermabond was used to reinforce the skin closure.  The patient woke without any problems, neurologically intact.     Fonda FORBES Rim, MD [facility] DATE OF DICTATION:   08/14/2024  "

## 2024-08-14 NOTE — Progress Notes (Incomplete)
.  cw

## 2024-08-14 NOTE — Anesthesia Postprocedure Evaluation (Signed)
"   Anesthesia Post Note  Patient: Matthew Patterson  Procedure(s) Performed: LEFT CAROTID ENDARTERECTOMY (Left: Neck) ANGIOPLASTY, USING XENOSURE 1CM BIOLOGIC PATCH GRAFT (Left: Neck)     Patient location during evaluation: PACU Anesthesia Type: General Level of consciousness: awake and alert Pain management: pain level controlled Vital Signs Assessment: post-procedure vital signs reviewed and stable Respiratory status: spontaneous breathing, nonlabored ventilation, respiratory function stable and patient connected to nasal cannula oxygen Cardiovascular status: blood pressure returned to baseline and stable Postop Assessment: no apparent nausea or vomiting Anesthetic complications: no   No notable events documented.  Last Vitals:  Vitals:   08/14/24 1345 08/14/24 1415  BP: 114/75 106/70  Pulse: 68 67  Resp: 15 (!) 8  Temp:    SpO2: 93% 98%    Last Pain:  Vitals:   08/14/24 1415  PainSc: 0-No pain                 Thom JONELLE Peoples      "

## 2024-08-14 NOTE — Anesthesia Procedure Notes (Signed)
 Procedure Name: Intubation Date/Time: 08/14/2024 7:47 AM  Performed by: Vertie Arthea RAMAN, CRNAPre-anesthesia Checklist: Patient identified, Emergency Drugs available, Suction available and Patient being monitored Patient Re-evaluated:Patient Re-evaluated prior to induction Oxygen Delivery Method: Circle System Utilized Preoxygenation: Pre-oxygenation with 100% oxygen Induction Type: IV induction Ventilation: Mask ventilation without difficulty and Oral airway inserted - appropriate to patient size Laryngoscope Size: Cleotilde and 2 Grade View: Grade I Tube type: Oral Tube size: 7.5 mm Number of attempts: 1 Airway Equipment and Method: Stylet and Oral airway Placement Confirmation: ETT inserted through vocal cords under direct vision, positive ETCO2 and breath sounds checked- equal and bilateral Tube secured with: Tape Dental Injury: Teeth and Oropharynx as per pre-operative assessment

## 2024-08-14 NOTE — Plan of Care (Signed)
" °  Problem: Nutritional: Goal: Will attain and maintain optimal nutritional status Outcome: Progressing   Problem: Education: Goal: Knowledge of the prescribed therapeutic regimen will improve Outcome: Progressing   Problem: Education: Goal: Knowledge of disease or condition will improve 08/14/2024 1815 by Christie Carlin NOVAK, RN Outcome: Progressing 08/14/2024 1815 by Christie Carlin NOVAK, RN Outcome: Progressing Goal: Knowledge of secondary prevention will improve (MUST DOCUMENT ALL) 08/14/2024 1815 by Christie Carlin NOVAK, RN Outcome: Progressing 08/14/2024 1815 by Christie Carlin NOVAK, RN Outcome: Progressing Goal: Knowledge of patient specific risk factors will improve (DELETE if not current risk factor) 08/14/2024 1815 by Christie Carlin NOVAK, RN Outcome: Progressing 08/14/2024 1815 by Christie Carlin NOVAK, RN Outcome: Progressing   Problem: Skin Integrity: Goal: Risk for impaired skin integrity will decrease Outcome: Progressing   "

## 2024-08-14 NOTE — Progress Notes (Signed)
 Pt arrived from ..pacu..., A/ox .4.Marland Kitchenpt denies any pain, MD aware,CCMD called. CHG bath given,no further needs at this time

## 2024-08-14 NOTE — Transfer of Care (Signed)
 Immediate Anesthesia Transfer of Care Note  Patient: Matthew Patterson  Procedure(s) Performed: LEFT CAROTID ENDARTERECTOMY (Left: Neck) ANGIOPLASTY, USING XENOSURE 1CM BIOLOGIC PATCH GRAFT (Left: Neck)  Patient Location: PACU  Anesthesia Type:General  Level of Consciousness: awake, alert , and oriented  Airway & Oxygen Therapy: Patient Spontanous Breathing and Patient connected to face mask oxygen  Post-op Assessment: Report given to RN, Post -op Vital signs reviewed and stable, Patient moving all extremities X 4, and Patient able to stick tongue midline  Post vital signs: Reviewed and stable  Last Vitals:  Vitals Value Taken Time  BP 122/73 08/14/24 10:53  Temp    Pulse 84 08/14/24 10:57  Resp 12 08/14/24 10:57  SpO2 92 % 08/14/24 10:57  Vitals shown include unfiled device data.  Last Pain:  Vitals:   08/14/24 0621  PainSc: 0-No pain         Complications: No notable events documented.

## 2024-08-15 ENCOUNTER — Encounter (HOSPITAL_COMMUNITY): Payer: Self-pay | Admitting: Vascular Surgery

## 2024-08-15 ENCOUNTER — Other Ambulatory Visit (HOSPITAL_COMMUNITY): Payer: Self-pay

## 2024-08-15 DIAGNOSIS — Z48812 Encounter for surgical aftercare following surgery on the circulatory system: Secondary | ICD-10-CM

## 2024-08-15 LAB — GLUCOSE, CAPILLARY: Glucose-Capillary: 132 mg/dL — ABNORMAL HIGH (ref 70–99)

## 2024-08-15 LAB — LIPID PANEL
Cholesterol: 88 mg/dL (ref 0–200)
HDL: 38 mg/dL — ABNORMAL LOW
LDL Cholesterol: 37 mg/dL (ref 0–99)
Total CHOL/HDL Ratio: 2.3 ratio
Triglycerides: 66 mg/dL
VLDL: 13 mg/dL (ref 0–40)

## 2024-08-15 LAB — BASIC METABOLIC PANEL WITH GFR
Anion gap: 9 (ref 5–15)
BUN: 21 mg/dL (ref 8–23)
CO2: 25 mmol/L (ref 22–32)
Calcium: 8.9 mg/dL (ref 8.9–10.3)
Chloride: 105 mmol/L (ref 98–111)
Creatinine, Ser: 1.22 mg/dL (ref 0.61–1.24)
GFR, Estimated: >60 mL/min
Glucose, Bld: 141 mg/dL — ABNORMAL HIGH (ref 70–99)
Potassium: 4.1 mmol/L (ref 3.5–5.1)
Sodium: 140 mmol/L (ref 135–145)

## 2024-08-15 LAB — CBC
HCT: 37.3 % — ABNORMAL LOW (ref 39.0–52.0)
Hemoglobin: 12.5 g/dL — ABNORMAL LOW (ref 13.0–17.0)
MCH: 31.4 pg (ref 26.0–34.0)
MCHC: 33.5 g/dL (ref 30.0–36.0)
MCV: 93.7 fL (ref 80.0–100.0)
Platelets: 172 K/uL (ref 150–400)
RBC: 3.98 MIL/uL — ABNORMAL LOW (ref 4.22–5.81)
RDW: 12.8 % (ref 11.5–15.5)
WBC: 9 K/uL (ref 4.0–10.5)
nRBC: 0 % (ref 0.0–0.2)

## 2024-08-15 LAB — POCT ACTIVATED CLOTTING TIME
Activated Clotting Time: 214 s
Activated Clotting Time: 255 s
Activated Clotting Time: 245 s

## 2024-08-15 MED ORDER — OXYCODONE-ACETAMINOPHEN 5-325 MG PO TABS
1.0000 | ORAL_TABLET | Freq: Four times a day (QID) | ORAL | 0 refills | Status: AC | PRN
  Filled 2024-08-15: qty 15, 4d supply, fill #0

## 2024-08-15 NOTE — Discharge Instructions (Signed)
   [facility]  Discharge Instructions   Carotid Endarterectomy (CEA)  Please refer to the following instructions for your post-procedure care. Your surgeon or physician assistant will discuss any changes with you.  Activity  You are encouraged to walk as much as you can. You can slowly return to normal activities but must avoid strenuous activity and heavy lifting until your doctor tell you it's OK. Avoid activities such as vacuuming or swinging a golf club. You can drive after one week if you are comfortable and you are no longer taking prescription pain medications. It is normal to feel tired for serval weeks after your surgery. It is also normal to have difficulty with sleep habits, eating, and bowel movements after surgery. These will go away with time.  Bathing/Showering  You may shower after you come home. Do not soak in a bathtub, hot tub, or swim until the incision heals completely.  Incision Care  Shower every day. Clean your incision with mild soap and water. Pat the area dry with a clean towel. You do not need a bandage unless otherwise instructed. Do not apply any ointments or creams to your incision. You may have skin glue on your incision. Do not peel it off. It will come off on its own in about one week. Your incision may feel thickened and raised for several weeks after your surgery. This is normal and the skin will soften over time. For Men Only: It's OK to shave around the incision but do not shave the incision itself for 2 weeks. It is common to have numbness under your chin that could last for several months.  Diet  Resume your normal diet. There are no special food restrictions following this procedure. A low fat/low cholesterol diet is recommended for all patients with vascular disease. In order to heal from your surgery, it is CRITICAL to get adequate nutrition. Your body requires vitamins, minerals, and protein. Vegetables are the best  source of vitamins and minerals. Vegetables also provide the perfect balance of protein. Processed food has little nutritional value, so try to avoid this.        Medications  Resume taking all of your medications unless your doctor or physician assistant tells you not to. If your incision is causing pain, you may take over-the- counter pain relievers such as acetaminophen  (Tylenol ). If you were prescribed a stronger pain medication, please be aware these medications can cause nausea and constipation. Prevent nausea by taking the medication with a snack or meal. Avoid constipation by drinking plenty of fluids and eating foods with a high amount of fiber, such as fruits, vegetables, and grains. Do not take Tylenol  if you are taking prescription pain medications.  Follow Up  Our office will schedule a follow up appointment 2-3 weeks following discharge.  Please call us  immediately for any of the following conditions  Increased pain, redness, drainage (pus) from your incision site. Fever of 101 degrees or higher. If you should develop stroke (slurred speech, difficulty swallowing, weakness on one side of your body, loss of vision) you should call 911 and go to the nearest emergency room.  Reduce your risk of vascular disease:  Stop smoking. If you would like help call [facility] at 1-800-QUIT-NOW (986-078-2376) or  at [phone]. Manage your cholesterol Maintain a desired weight Control your diabetes Keep your blood pressure down  If you have any questions, please call the office at [phone].

## 2024-08-15 NOTE — Progress Notes (Addendum)
 PHARMACIST LIPID MONITORING   Matthew Patterson is a 69 y.o. male admitted on 08/14/2024 with L ICA stenosis.  Pharmacy has been consulted to optimize lipid-lowering therapy with the indication of secondary prevention for clinical ASCVD.  Recent Labs:  Lipid Panel (last 6 months):   Lab Results  Component Value Date   CHOL 88 08/15/2024   TRIG 66 08/15/2024   HDL 38 (L) 08/15/2024   CHOLHDL 2.3 08/15/2024   VLDL 13 08/15/2024   LDLCALC 37 08/15/2024    Hepatic function panel (last 6 months):   Lab Results  Component Value Date   AST 26 08/09/2024   ALT 17 08/09/2024   ALKPHOS 44 08/09/2024   BILITOT 0.5 08/09/2024   BILIDIR 0.2 06/06/2024   IBILI 0.2 (L) 06/06/2024    SCr (since admission):   Serum creatinine: 1.22 mg/dL 98/86/73 9584 Estimated creatinine clearance: 58 mL/min  Current therapy and lipid therapy tolerance Current lipid-lowering therapy: rosuvastatin  40 mg daily , zetia  10 daily   Documented or reported allergies or intolerances to lipid-lowering therapies (if applicable): n/a  Plan:   LDL 37   1.Statin intensity (high intensity recommended for all patients regardless of the LDL):  No statin changes. The patient is already on a high intensity statin.  2.Add ezetimibe  (if any one of the following):  already on  3.Refer to lipid clinic:   No  4.Follow-up with:  Primary care provider - Merna Huxley, NP  5.Follow-up labs after discharge:  No changes in lipid therapy, repeat a lipid panel in one year.      Sharyne Glatter, PharmD, BCCCP Critical Care Clinical Pharmacist 08/15/2024 8:52 AM

## 2024-08-15 NOTE — Plan of Care (Signed)
" °  Problem: Clinical Measurements: Goal: Ability to maintain clinical measurements within normal limits Outcome: Progressing Goal: Postoperative complications will be avoided or minimized Outcome: Progressing   "

## 2024-08-15 NOTE — Progress Notes (Signed)
" °  Progress Note    08/15/2024 10:45 AM 1 Day Post-Op  Subjective: Seen ambulating in the hallways this morning without issue.  He denies any strokelike symptoms since surgery.  He is ready for discharge home.   Vitals:   08/15/24 0442 08/15/24 0800  BP: 107/76 105/68  Pulse: 69 67  Resp: 16 12  Temp: 97.8 F (36.6 C) 97.6 F (36.4 C)  SpO2:  99%   Physical Exam: Lungs: Nonlabored Incisions: Left neck incision is well-appearing without hematoma, some ecchymosis Extremities: Moving all extremities well Neurologic: Cranial nerves grossly intact; some left lower lip weakness  CBC    Component Value Date/Time   WBC 9.0 08/15/2024 0415   RBC 3.98 (L) 08/15/2024 0415   HGB 12.5 (L) 08/15/2024 0415   HCT 37.3 (L) 08/15/2024 0415   PLT 172 08/15/2024 0415   MCV 93.7 08/15/2024 0415   MCH 31.4 08/15/2024 0415   MCHC 33.5 08/15/2024 0415   RDW 12.8 08/15/2024 0415   LYMPHSABS 1.1 05/18/2024 1638   MONOABS 0.3 05/18/2024 1638   EOSABS 0.1 05/18/2024 1638   BASOSABS 0.1 05/18/2024 1638    BMET    Component Value Date/Time   NA 140 08/15/2024 0415   K 4.1 08/15/2024 0415   CL 105 08/15/2024 0415   CO2 25 08/15/2024 0415   GLUCOSE 141 (H) 08/15/2024 0415   BUN 21 08/15/2024 0415   CREATININE 1.22 08/15/2024 0415   CALCIUM  8.9 08/15/2024 0415   GFRNONAA >60 08/15/2024 0415   GFRAA >60 10/02/2019 0934    INR    Component Value Date/Time   INR 1.3 (H) 05/23/2024 1218     Intake/Output Summary (Last 24 hours) at 08/15/2024 1045 Last data filed at 08/15/2024 0220 Gross per 24 hour  Intake 302.28 ml  Output 50 ml  Net 252.28 ml     Assessment/Plan:  69 y.o. male is s/p L CEA 1 Day Post-Op   Mr. Volner is a 69 year old male who underwent left carotid endarterectomy for asymptomatic stenosis.  His neuroexam continues to be at baseline; he has not experienced any neurological events since surgery.  Left neck incision is well-appearing without hematoma.  JP can be  removed prior to discharge home.  He will follow-up in the office in 2 to 3 weeks for incision check.     Donnice Sender, PA-C [facility] [phone] 08/15/2024 10:45 AM  VASCULAR STAFF ADDENDUM: I have independently interviewed and examined the patient. I agree with the above.  Doing great, walking the halls.  Wants to go home.  No deficits Drain pulled  Fonda FORBES Rim MD [facility] Phone Number: [phone] 08/15/2024 10:45 AM    "

## 2024-08-15 NOTE — Progress Notes (Signed)
" °  Progress Note    08/15/2024 9:20 AM 1 Day Post-Op  Subjective: Seen ambulating in the hallways this morning without issue.  He denies any strokelike symptoms since surgery.  He is ready for discharge home.   Vitals:   08/15/24 0442 08/15/24 0800  BP: 107/76 105/68  Pulse: 69 67  Resp: 16 12  Temp: 97.8 F (36.6 C) 97.6 F (36.4 C)  SpO2:  99%   Physical Exam: Lungs: Nonlabored Incisions: Left neck incision is well-appearing without hematoma, some ecchymosis Extremities: Moving all extremities well Neurologic: Cranial nerves grossly intact; some left lower lip weakness  CBC    Component Value Date/Time   WBC 9.0 08/15/2024 0415   RBC 3.98 (L) 08/15/2024 0415   HGB 12.5 (L) 08/15/2024 0415   HCT 37.3 (L) 08/15/2024 0415   PLT 172 08/15/2024 0415   MCV 93.7 08/15/2024 0415   MCH 31.4 08/15/2024 0415   MCHC 33.5 08/15/2024 0415   RDW 12.8 08/15/2024 0415   LYMPHSABS 1.1 05/18/2024 1638   MONOABS 0.3 05/18/2024 1638   EOSABS 0.1 05/18/2024 1638   BASOSABS 0.1 05/18/2024 1638    BMET    Component Value Date/Time   NA 140 08/15/2024 0415   K 4.1 08/15/2024 0415   CL 105 08/15/2024 0415   CO2 25 08/15/2024 0415   GLUCOSE 141 (H) 08/15/2024 0415   BUN 21 08/15/2024 0415   CREATININE 1.22 08/15/2024 0415   CALCIUM  8.9 08/15/2024 0415   GFRNONAA >60 08/15/2024 0415   GFRAA >60 10/02/2019 0934    INR    Component Value Date/Time   INR 1.3 (H) 05/23/2024 1218     Intake/Output Summary (Last 24 hours) at 08/15/2024 0920 Last data filed at 08/15/2024 0220 Gross per 24 hour  Intake 802.28 ml  Output 100 ml  Net 702.28 ml     Assessment/Plan:  69 y.o. male is s/p L CEA 1 Day Post-Op   Mr. Silguero is a 69 year old male who underwent left carotid endarterectomy for asymptomatic stenosis.  His neuroexam continues to be at baseline; he has not experienced any neurological events since surgery.  Left neck incision is well-appearing without hematoma.  JP can be  removed prior to discharge home.  He will follow-up in the office in 2 to 3 weeks for incision check.     Donnice Sender, PA-C [facility] [phone] 08/15/2024 9:20 AM    "

## 2024-08-16 ENCOUNTER — Telehealth: Payer: Self-pay

## 2024-08-16 NOTE — Discharge Summary (Signed)
 " Discharge Summary     Matthew Patterson 06/12/1956 69 y.o. male  [id number]  Admission Date: 08/14/2024  Discharge Date: 08/16/24  Physician: Dr. Lanis  Admission Diagnosis: Carotid stenosis, left [I65.22] Carotid stenosis [I65.29] Carotid artery stenosis [I65.29]  Discharge Day services:    See progress note 08/15/24  Hospital Course:  Matthew Patterson is a 69 year old male who was brought in as an outpatient by Dr. Lanis on 08/14/2024 and underwent left carotid endarterectomy due to high-grade asymptomatic stenosis of the left ICA.  He tolerated the procedure well and was admitted to the hospital postoperatively.  POD #1 he did not experience any neurological events after surgery.  He was ambulating the halls without difficulty and was ready for discharge home.  JP drain was removed from the left neck without complication.  He will follow-up in the office in 2 to 3 weeks for incision check.  He was prescribed 1 to 2 days of narcotic pain medication for continued postoperative pain control.  He was discharged home in stable condition.   Recent Labs    08/15/24 0415  NA 140  K 4.1  CL 105  CO2 25  GLUCOSE 141*  BUN 21  CALCIUM  8.9   Recent Labs    08/14/24 1142 08/15/24 0415  WBC  --  9.0  HGB 13.0 12.5*  HCT 38.6* 37.3*  PLT  --  172   No results for input(s): INR in the last 72 hours.     Discharge Diagnosis:  Carotid stenosis, left [I65.22] Carotid stenosis [I65.29] Carotid artery stenosis [I65.29]  Secondary Diagnosis: Patient Active Problem List   Diagnosis Date Noted   Carotid stenosis 08/14/2024   Carotid artery stenosis 08/14/2024   Diabetes mellitus (HCC) 06/09/2024   AKI (acute kidney injury) 06/06/2024   S/P CABG x 3 05/23/2024   Ventilator dependence (HCC) 05/23/2024   Sinus bradycardia 05/18/2024   Acute kidney injury superimposed on chronic kidney disease 05/18/2024   Arthritis 05/18/2024   Cigar smoker 05/18/2024   Chest pain  05/17/2024   Type 2 diabetes mellitus in remission 05/21/2022   Controlled type 2 diabetes mellitus without complication, without long-term current use of insulin  (HCC) 10/28/2021   Dyslipidemia 10/28/2021   Mixed hyperlipidemia 09/16/2021   Controlled diabetes mellitus type 2 with complications (HCC) 04/16/2017   Anxiety and depression 09/15/2016   Insomnia 09/15/2016   Impaired glucose tolerance 02/25/2015   Degeneration of cervical intervertebral disc 01/29/2014   Chest tightness 11/15/2012   Reflux esophagitis 02/11/2012   Routine general medical examination at a health care facility 02/11/2012   Left ankle pain 08/20/2011   OTHER ACUTE REACTIONS TO STRESS 02/10/2008   HYPERTRIGLYCERIDEMIA 10/27/2007   Past Medical History:  Diagnosis Date   Anxiety and depression    Carotid artery occlusion    Diabetes mellitus without complication (HCC)    GERD (gastroesophageal reflux disease)    Hyperlipidemia    Melanoma (HCC) 2023   back   Myocardial infarction (HCC) 05-17-24    Allergies as of 08/15/2024   No Known Allergies      Medication List     TAKE these medications    acetaminophen  325 MG tablet Commonly known as: TYLENOL  Take 2 tablets (650 mg total) by mouth every 6 (six) hours as needed. What changed:  when to take this reasons to take this   aspirin  EC 325 MG tablet Take 1 tablet (325 mg total) by mouth daily.   clopidogrel  75 MG tablet Commonly known  as: PLAVIX  Take 1 tablet (75 mg total) by mouth daily.   ezetimibe  10 MG tablet Commonly known as: ZETIA  Take 1 tablet (10 mg total) by mouth daily.   fenofibrate  145 MG tablet Commonly known as: TRICOR  TAKE 1 TABLET BY MOUTH DAILY   FreeStyle Libre 3 Plus Sensor Misc APPLY SENSOR TO SKIN FOR CONTINUOUS BLOOD SUGAR MONITORING, REPLACE EVERY 15 DAYS   glucose blood test strip Use as instructed   metFORMIN  750 MG 24 hr tablet Commonly known as: GLUCOPHAGE -XR Take 1 tablet (750 mg total) by mouth  daily with breakfast.   multivitamin with minerals Tabs tablet Take 1 tablet by mouth daily.   onetouch ultrasoft lancets Use as instructed   oxyCODONE -acetaminophen  5-325 MG tablet Commonly known as: PERCOCET/ROXICET Take 1 tablet by mouth every 6 (six) hours as needed for moderate pain (pain score 4-6).   rosuvastatin  40 MG tablet Commonly known as: CRESTOR  Take 1 tablet (40 mg total) by mouth daily.   sertraline  25 MG tablet Commonly known as: ZOLOFT  Take 1 tablet (25 mg total) by mouth daily.   traZODone  100 MG tablet Commonly known as: DESYREL  TAKE 1 TABLET BY MOUTH AT BEDTIME         Discharge Instructions:   [facility] Discharge Instructions Carotid Endarterectomy (CEA)  Please refer to the following instructions for your post-procedure care. Your surgeon or physician assistant will discuss any changes with you.  Activity  You are encouraged to walk as much as you can. You can slowly return to normal activities but must avoid strenuous activity and heavy lifting until your doctor tell you it's OK. Avoid activities such as vacuuming or swinging a golf club. You can drive after one week if you are comfortable and you are no longer taking prescription pain medications. It is normal to feel tired for serval weeks after your surgery. It is also normal to have difficulty with sleep habits, eating, and bowel movements after surgery. These will go away with time.  Bathing/Showering  You may shower after you come home. Do not soak in a bathtub, hot tub, or swim until the incision heals completely.  Incision Care  Shower every day. Clean your incision with mild soap and water . Pat the area dry with a clean towel. You do not need a bandage unless otherwise instructed. Do not apply any ointments or creams to your incision. You may have skin glue on your incision. Do not peel it off. It will come off on its own in about one week. Your incision  may feel thickened and raised for several weeks after your surgery. This is normal and the skin will soften over time. For Men Only: It's OK to shave around the incision but do not shave the incision itself for 2 weeks. It is common to have numbness under your chin that could last for several months.  Diet  Resume your normal diet. There are no special food restrictions following this procedure. A low fat/low cholesterol diet is recommended for all patients with vascular disease. In order to heal from your surgery, it is CRITICAL to get adequate nutrition. Your body requires vitamins, minerals, and protein. Vegetables are the best source of vitamins and minerals. Vegetables also provide the perfect balance of protein. Processed food has little nutritional value, so try to avoid this.  Medications  Resume taking all of your medications unless your doctor or physician assistant tells you not to.  If your incision is causing pain,  you may take over-the- counter pain relievers such as acetaminophen  (Tylenol ). If you were prescribed a stronger pain medication, please be aware these medications can cause nausea and constipation.  Prevent nausea by taking the medication with a snack or meal. Avoid constipation by drinking plenty of fluids and eating foods with a high amount of fiber, such as fruits, vegetables, and grains. Do not take Tylenol  if you are taking prescription pain medications.  Follow Up  Our office will schedule a follow up appointment 2-3 weeks following discharge.  Please call us  immediately for any of the following conditions  Increased pain, redness, drainage (pus) from your incision site. Fever of 101 degrees or higher. If you should develop stroke (slurred speech, difficulty swallowing, weakness on one side of your body, loss of vision) you should call 911 and go to the nearest emergency room.  Reduce your risk of vascular disease:  Stop smoking. If you would like help call  [facility] at 1-800-QUIT-NOW ((765) 670-7761) or [facility] at 562-530-0509. Manage your cholesterol Maintain a desired weight Control your diabetes Keep your blood pressure down  If you have any questions, please call the office at [phone].   Disposition: home  Patient's condition: is Good  Follow up: 1. [facility] in 3 weeks.   Donnice Sender, PA-C [facility] [phone]   --- For [facility] Registry use ---   Modified Rankin score at D/C (0-6): 0  IV medication needed for:  1. Hypertension: No 2. Hypotension: No  Post-op Complications: No  1. Post-op CVA or TIA: No  If yes: Event classification (right eye, left eye, right cortical, left cortical, verterobasilar, other):   If yes: Timing of event (intra-op, <6 hrs post-op, >=6 hrs post-op, unknown):   2. CN injury: No  If yes: CN  injuried   3. Myocardial infarction: No  If yes: Dx by (EKG or clinical, Troponin):   4.  CHF: No  5.  Dysrhythmia (new): No  6. Wound infection: No  7. Reperfusion symptoms: No  8. Return to OR: No  If yes: return to OR for (bleeding, neurologic, other CEA incision, other):   Discharge medications: Statin use:  Yes ASA use:  Yes   Beta blocker use:  No ACE-Inhibitor use:  No  ARB use:  No CCB use: No P2Y12 Antagonist use: Yes, [x ] Plavix , [ ]  Plasugrel, [ ]  Ticlopinine, [ ]  Ticagrelor, [ ]  Other, [ ]  No for medical reason, [ ]  Non-compliant, [ ]  Not-indicated Anti-coagulant use:  No, [ ]  Warfarin, [ ]  Rivaroxaban, [ ]  Dabigatran,    "

## 2024-08-16 NOTE — Transitions of Care (Post Inpatient/ED Visit) (Signed)
" ° °  08/16/2024  Name: Matthew Patterson MRN: [id number] DOB: Jan 29, 1956  Today's TOC FU Call Status: Today's TOC FU Call Status:: Unsuccessful Call (1st Attempt) Unsuccessful Call (1st Attempt) Date: 08/16/24  Attempted to reach the patient regarding the most recent Inpatient/ED visit.  Follow Up Plan: Additional outreach attempts will be made to reach the patient to complete the Transitions of Care (Post Inpatient/ED visit) call.   Emy Angevine J. Jennie Hannay RN, MSN [facility], [facility] RN Care Manager Direct Dial: [phone]  Fax: [phone] Website: [email or web address]   "

## 2024-08-17 ENCOUNTER — Telehealth: Payer: Self-pay

## 2024-08-17 NOTE — Transitions of Care (Post Inpatient/ED Visit) (Signed)
" ° °  08/17/2024  Name: Matthew Patterson MRN: [id number] DOB: [birth year]  Today's TOC FU Call Status: Today's TOC FU Call Status:: Unsuccessful Call (2nd Attempt) Unsuccessful Call (2nd Attempt) Date: 08/17/24  Attempted to reach the patient regarding the most recent Inpatient/ED visit.  Follow Up Plan: Additional outreach attempts will be made to reach the patient to complete the Transitions of Care (Post Inpatient/ED visit) call.   Alan Ee, RN, BSN, CEN Population Health- Transition of Care Team.  [facility] [phone]  "

## 2024-08-18 ENCOUNTER — Telehealth: Payer: Self-pay

## 2024-08-18 ENCOUNTER — Encounter: Payer: Self-pay | Admitting: Vascular Surgery

## 2024-08-18 NOTE — Transitions of Care (Post Inpatient/ED Visit) (Signed)
" ° °  08/18/2024  Name: LAKSH HINNERS MRN: [id number] DOB: [birth year]  Today's TOC FU Call Status: Today's TOC FU Call Status:: Unsuccessful Call (3rd Attempt) Unsuccessful Call (3rd Attempt) Date: 08/18/24  Attempted to reach the patient regarding the most recent Inpatient/ED visit.  Follow Up Plan: No further outreach attempts will be made at this time. We have been unable to contact the patient.  Eri Mcevers J. Lakelynn Severtson RN, MSN [facility], [facility] RN Care Manager Direct Dial: [phone]  Fax: [phone] Website: [email or web address]   "

## 2024-08-31 ENCOUNTER — Ambulatory Visit: Admitting: Physician Assistant

## 2024-08-31 VITALS — BP 130/82 | HR 76 | Temp 98.3°F | Ht 69.0 in | Wt 169.0 lb

## 2024-08-31 DIAGNOSIS — I6522 Occlusion and stenosis of left carotid artery: Secondary | ICD-10-CM

## 2024-08-31 NOTE — Progress Notes (Signed)
 " POST OPERATIVE OFFICE NOTE    CC:  F/u for surgery  HPI:  Matthew Patterson is a 69 y.o. male who is here for a postop visit.  He recently underwent left carotid endarterectomy with bovine pericardial patch angioplasty on 08/14/2024 by Dr. Lanis.  This was done for asymptomatic critical left ICA stenosis.  He returns today for follow-up.  He has no complaints at today's office visit.  He feels like his left sided neck incision is healing well without tenderness, drainage, erythema, or dehiscence.  He denies any strokelike symptoms such as slurred speech, facial droop, sudden unilateral weakness/numbness, or sudden visual changes.  He is tolerating a normal diet without swallowing difficulties.   He is taking his daily aspirin , Plavix , and statin.   Allergies[1]  Current Outpatient Medications  Medication Sig Dispense Refill   acetaminophen  (TYLENOL ) 325 MG tablet Take 2 tablets (650 mg total) by mouth every 6 (six) hours as needed. (Patient taking differently: Take 650 mg by mouth daily as needed for mild pain (pain score 1-3) or moderate pain (pain score 4-6).)     aspirin  EC 325 MG tablet Take 1 tablet (325 mg total) by mouth daily. 100 tablet 3   clopidogrel  (PLAVIX ) 75 MG tablet Take 1 tablet (75 mg total) by mouth daily. 30 tablet 6   Continuous Glucose Sensor (FREESTYLE LIBRE 3 PLUS SENSOR) MISC APPLY SENSOR TO SKIN FOR CONTINUOUS BLOOD SUGAR MONITORING, REPLACE EVERY 15 DAYS 6 each 3   ezetimibe  (ZETIA ) 10 MG tablet Take 1 tablet (10 mg total) by mouth daily. 90 tablet 3   fenofibrate  (TRICOR ) 145 MG tablet TAKE 1 TABLET BY MOUTH DAILY 90 tablet 3   glucose blood test strip Use as instructed 100 each 12   Lancets (ONETOUCH ULTRASOFT) lancets Use as instructed 100 each 12   metFORMIN  (GLUCOPHAGE -XR) 750 MG 24 hr tablet Take 1 tablet (750 mg total) by mouth daily with breakfast. 90 tablet 3   Multiple Vitamin (MULTIVITAMIN WITH MINERALS) TABS tablet Take 1 tablet by mouth daily.      oxyCODONE -acetaminophen  (PERCOCET/ROXICET) 5-325 MG tablet Take 1 tablet by mouth every 6 (six) hours as needed for moderate pain (pain score 4-6). 15 tablet 0   rosuvastatin  (CRESTOR ) 40 MG tablet Take 1 tablet (40 mg total) by mouth daily. 90 tablet 3   sertraline  (ZOLOFT ) 25 MG tablet Take 1 tablet (25 mg total) by mouth daily. 30 tablet 3   traZODone  (DESYREL ) 100 MG tablet TAKE 1 TABLET BY MOUTH AT BEDTIME 90 tablet 1   No current facility-administered medications for this visit.    ROS:  See HPI  Physical Exam:  Incision: Left-sided neck incision healing appropriately without signs of infection or hematoma Extremities: Palpable and equal radial pulses bilaterally Neuro: Alert and oriented x 3, no motor or  sensory deficits     Assessment/Plan:  This is a 69 y.o. male who is here for postop visit  - The patient recently underwent left carotid endarterectomy for asymptomatic critical ICA stenosis.  His left-sided neck incision is nearly healed without signs of infection or hematoma -He remains neurologically intact on exam.  He denies any strokelike symptoms. - On exam he has palpable and equal radial pulses bilaterally. - He will continue his aspirin , statin, and Plavix .  He can follow-up with our office in 3 months with bilateral carotid artery duplex   Ahmed Holster, PA-C [facility] [phone]   Clinic MD:  Lanis      [  1] No Known Allergies  "

## 2024-09-04 ENCOUNTER — Other Ambulatory Visit: Payer: Self-pay

## 2024-09-04 DIAGNOSIS — I6522 Occlusion and stenosis of left carotid artery: Secondary | ICD-10-CM

## 2024-10-10 ENCOUNTER — Ambulatory Visit

## 2024-11-30 ENCOUNTER — Ambulatory Visit

## 2024-11-30 ENCOUNTER — Ambulatory Visit (HOSPITAL_COMMUNITY)

## 2024-12-08 ENCOUNTER — Ambulatory Visit: Admitting: Internal Medicine

## 2025-05-10 ENCOUNTER — Encounter: Admitting: Adult Health
